# Patient Record
Sex: Female | Born: 1950 | Race: Black or African American | Hispanic: No | Marital: Single | Smoking: Never smoker
Health system: Southern US, Community
[De-identification: ages and names within clinical notes are randomized; demographics above are authoritative.]

## PROBLEM LIST (undated history)

## (undated) DIAGNOSIS — D509 Iron deficiency anemia, unspecified: Secondary | ICD-10-CM

## (undated) DIAGNOSIS — Z87442 Personal history of urinary calculi: Secondary | ICD-10-CM

## (undated) DIAGNOSIS — K279 Peptic ulcer, site unspecified, unspecified as acute or chronic, without hemorrhage or perforation: Secondary | ICD-10-CM

## (undated) DIAGNOSIS — R05 Cough: Secondary | ICD-10-CM

## (undated) DIAGNOSIS — K219 Gastro-esophageal reflux disease without esophagitis: Secondary | ICD-10-CM

## (undated) DIAGNOSIS — I10 Essential (primary) hypertension: Secondary | ICD-10-CM

## (undated) DIAGNOSIS — R011 Cardiac murmur, unspecified: Secondary | ICD-10-CM

## (undated) DIAGNOSIS — Z9889 Other specified postprocedural states: Secondary | ICD-10-CM

## (undated) DIAGNOSIS — Z951 Presence of aortocoronary bypass graft: Secondary | ICD-10-CM

## (undated) DIAGNOSIS — I2510 Atherosclerotic heart disease of native coronary artery without angina pectoris: Secondary | ICD-10-CM

## (undated) DIAGNOSIS — J309 Allergic rhinitis, unspecified: Secondary | ICD-10-CM

## (undated) DIAGNOSIS — F411 Generalized anxiety disorder: Secondary | ICD-10-CM

## (undated) DIAGNOSIS — F329 Major depressive disorder, single episode, unspecified: Secondary | ICD-10-CM

## (undated) DIAGNOSIS — M1990 Unspecified osteoarthritis, unspecified site: Secondary | ICD-10-CM

## (undated) DIAGNOSIS — I340 Nonrheumatic mitral (valve) insufficiency: Secondary | ICD-10-CM

## (undated) HISTORY — DX: Peptic ulcer, site unspecified, unspecified as acute or chronic, without hemorrhage or perforation: K27.9

## (undated) HISTORY — PX: ABDOMINAL HYSTERECTOMY: SHX81

## (undated) HISTORY — DX: Atherosclerotic heart disease of native coronary artery without angina pectoris: I25.10

## (undated) HISTORY — PX: CARDIAC CATHETERIZATION: SHX172

## (undated) HISTORY — PX: OTHER SURGICAL HISTORY: SHX169

## (undated) HISTORY — DX: Generalized anxiety disorder: F41.1

## (undated) HISTORY — DX: Iron deficiency anemia, unspecified: D50.9

## (undated) HISTORY — DX: Cardiac murmur, unspecified: R01.1

## (undated) HISTORY — PX: CHOLECYSTECTOMY: SHX55

## (undated) HISTORY — DX: Essential (primary) hypertension: I10

## (undated) HISTORY — DX: Gastro-esophageal reflux disease without esophagitis: K21.9

## (undated) HISTORY — DX: Major depressive disorder, single episode, unspecified: F32.9

## (undated) HISTORY — DX: Cough: R05

## (undated) HISTORY — DX: Allergic rhinitis, unspecified: J30.9

## (undated) NOTE — *Deleted (*Deleted)
[facility]  [phone] [phone]     ID: Tami Wood DOB: June 23, 1950  MR#: [id number]  JYN#:[id number]  Patient Care Team: Corwin Levins, MD as PCP - General (Internal Medicine) Pershing Proud, RN as Oncology Nurse Navigator Donnelly Angelica, RN as Oncology Nurse Navigator Harriette Bouillon, MD as Consulting Physician (General Surgery) Trysten Bernard, Valentino Hue, MD as Consulting Physician (Oncology) Antony Blackbird, MD as Consulting Physician (Radiation Oncology) Elder Negus, MD as Consulting Physician (Cardiology) Purcell Nails, MD as Consulting Physician (Cardiothoracic Surgery) Drema Halon, MD as Consulting Physician (Otolaryngology) Lowella Dell, MD OTHER MD:  CHIEF COMPLAINT: Ductal carcinoma in situ, estrogen receptor positive  CURRENT TREATMENT: observation   INTERVAL HISTORY: Idalie returns today for follow up of her noninvasive breast cancer.  She opted against antiestrogens and is now under observation.  She completed radiation therapy under Dr. Roselind Messier on 09/25/2019.  Since her last visit, she underwent bilateral diagnostic mammography with tomography at [facility] on 01/03/2020 showing: breast density category C; no evidence of malignancy in either breast.    REVIEW OF SYSTEMS: Samra    COVID 19 VACCINATION STATUS:    HISTORY OF CURRENT ILLNESS: From the original intake note:  Tami Wood had routine screening mammography on 12/18/2018 showing 0.7 cm probably-benign calcifications in the lower-outer right breast, 6 month follow up was recommended. She underwent right diagnostic mammography with tomography at [facility] on 06/18/2019 showing: breast density category C; 0.7 cm grouped calcifications in the lower-outer right breast.  Accordingly on 07/01/2019 she proceeded to biopsy of the right breast area in question. The pathology from this procedure ([id number]) showed: ductal carcinoma in situ with central necrosis  and calcifications, intermediate grade. Prognostic indicators significant for: estrogen receptor, 90% positive and progesterone receptor, 5% positive, both with strong staining intensity.   The patient's subsequent history is as detailed below.   PAST MEDICAL HISTORY: Past Medical History:  Diagnosis Date  . ALLERGIC RHINITIS 06/18/2009  . ANEMIA-IRON DEFICIENCY 11/28/2006  . ANXIETY 11/28/2006  . Arthritis   . Coronary artery disease   . Cough 06/18/2009  . DEPRESSION 11/28/2006  . GERD 12/10/2007  . History of kidney stones   . HYPERTENSION 11/28/2006  . Hypertension   . Mitral regurgitation   . MURMUR 11/28/2006  . PEPTIC ULCER DISEASE 12/10/2007  . 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 S/P mitral valve repair 04/10/2018   Complex valvuloplasty including artificial Gore-tex neochord placement x8 and Sorin Memo 4D ring annuloplasty, size 28    PAST SURGICAL HISTORY: Past Surgical History:  Procedure Laterality Date  . ABDOMINAL HYSTERECTOMY    . BREAST LUMPECTOMY WITH RADIOACTIVE SEED LOCALIZATION Right 08/07/2019   Procedure: RIGHT BREAST LUMPECTOMY WITH RADIOACTIVE SEED LOCALIZATION;  Surgeon: Harriette Bouillon, MD;  Location:  SURGERY CENTER;  Service: General;  Laterality: Right;  . CARDIAC CATHETERIZATION    . CHOLECYSTECTOMY    . CORONARY ARTERY BYPASS GRAFT N/A 04/10/2018   Procedure: CORONARY ARTERY BYPASS GRAFTING (CABG), ON PUMP, TIMES TWO, LIMA TO LAD AND SVG TO RCA USING LEFT INTERNAL MAMMARY ARTERY AND RIGHT GREATER SAPHENOUS VEIN HARVESTED ENDOSCOPICALLY;  Surgeon: Purcell Nails, MD;  Location: [facility] OR;  Service: Open Heart Surgery;  Laterality: N/A;  . MITRAL VALVE REPAIR N/A 04/10/2018   Procedure: MITRAL VALVE REPAIR (MVR) USING 28 MEMO 4D;  Surgeon: Purcell Nails, MD;  Location: [facility] OR;  Service: Open Heart  Surgery;  Laterality: N/A;  . RIGHT/LEFT HEART CATH AND CORONARY ANGIOGRAPHY N/A 02/06/2018   Procedure: RIGHT/LEFT HEART  CATH AND CORONARY ANGIOGRAPHY;  Surgeon: Elder Negus, MD;  Location: [facility] INVASIVE CV LAB;  Service: Cardiovascular;  Laterality: N/A;  . s/p multiple breast biopsy     negative  . TEE WITHOUT CARDIOVERSION N/A 02/06/2018   Procedure: TRANSESOPHAGEAL ECHOCARDIOGRAM (TEE);  Surgeon: Elder Negus, MD;  Location: [facility] ENDOSCOPY;  Service: Cardiovascular;  Laterality: N/A;  . TEE WITHOUT CARDIOVERSION N/A 04/10/2018   Procedure: TRANSESOPHAGEAL ECHOCARDIOGRAM (TEE);  Surgeon: Purcell Nails, MD;  Location: [facility] OR;  Service: Open Heart Surgery;  Laterality: N/A;    FAMILY HISTORY: Family History  Problem Relation Age of Onset  . Transient ischemic attack Other   . Hypertension Other   . Colon cancer Other   . Heart disease Father   . Heart failure Father   . Breast cancer Sister 75  . Colon cancer Brother   . Cancer Maternal Uncle   . Cancer Paternal Uncle   . Breast cancer Cousin   Her father is age 16 (as of 08-08-19). Her mother died at age 44 from pulmonary fibrosis. Marisela has 1 brother and 3 sisters. She reports breast cancer in a sister at age 44 and cancer of unknown type in a cousin. She also reports two uncles with cancer (unknown type), colon cancer in her brother, and a great uncle with leukemia.   GYNECOLOGIC HISTORY:  No LMP recorded. Patient has had a hysterectomy. Menarche: age unsure GX P 0 Contraceptive never used HRT never used  Hysterectomy? yes BSO? no   SOCIAL HISTORY: (updated 08/08/19)  Chanell retired from working for [employer].. She is single. She lives at home by herself, with no pets. She attends a [employer].    ADVANCED DIRECTIVES: Not in place. She intends to name her sister, [name], as her HCPOA. She can be reached at [phone] (cell).   HEALTH MAINTENANCE: Social History   Tobacco Use  . Smoking status: Never Smoker  . Smokeless tobacco: Never Used  Vaping Use  . Vaping Use: Never used  Substance Use Topics   . Alcohol use: No  . Drug use: Never     Colonoscopy: date unknown  PAP: date unknown  Bone density: date unknown   No Known Allergies  Current Outpatient Medications  Medication Sig Dispense Refill  . aspirin 81 MG EC tablet Take 81 mg by mouth daily.      Marland Kitchen atorvastatin (LIPITOR) 80 MG tablet TAKE 1 TABLET BY MOUTH DAILY AT 6 PM 90 tablet 3  . cetirizine (ZYRTEC) 10 MG tablet Take 1 tablet (10 mg total) by mouth daily. 30 tablet 11  . ENTRESTO 49-51 MG Take 1 tablet by mouth twice daily 60 tablet 6  . furosemide (LASIX) 20 MG tablet Take 1 tablet (20 mg total) by mouth daily as needed. 30 tablet 2  . metoprolol succinate (TOPROL XL) 50 MG 24 hr tablet Take 1 tablet (50 mg total) by mouth daily. 90 tablet 3  . sodium chloride (OCEAN) 0.65 % SOLN nasal spray Place 1 spray into both nostrils as needed for congestion.     No current facility-administered medications for this visit.    OBJECTIVE: African-American woman who appears stated age  There were no vitals filed for this visit.   There is no height or weight on file to calculate BMI.   Wt Readings from Last 3 Encounters:  10/24/19 176  lb 14.4 oz (80.2 kg)  09/19/19 179 lb 8 oz (81.4 kg)  09/16/19 178 lb (80.7 kg)      ECOG FS:1 - Symptomatic but completely ambulatory  Sclerae unicteric, EOMs intact Wearing a mask No cervical or supraclavicular adenopathy Lungs no rales or rhonchi Heart regular rate and rhythm Abd soft, nontender, positive bowel sounds MSK no focal spinal tenderness, no upper extremity lymphedema Neuro: nonfocal, well oriented, appropriate affect Breasts:    {Sclerae unicteric, EOMs intact Wearing a mask No cervical or supraclavicular adenopathy Lungs no rales or rhonchi Heart regular rate and rhythm Abd soft, nontender, positive bowel sounds MSK no focal spinal tenderness, no upper extremity lymphedema Neuro: nonfocal, well oriented, appropriate affect Breasts: The right breast is status post  lumpectomy and is currently receiving radiation.  There is some hyperpigmentation.  The cosmetic result is excellent.  There is no evidence of residual or recurrent disease.  Both axillae are benign.}  LAB RESULTS:  CMP     Component Value Date/Time   NA 140 07/10/2019 0836   NA 141 06/19/2019 1402   K 4.1 07/10/2019 0836   CL 106 07/10/2019 0836   CO2 30 07/10/2019 0836   GLUCOSE 98 07/10/2019 0836   BUN 9 07/10/2019 0836   BUN 9 06/19/2019 1402   CREATININE 0.77 07/10/2019 0836   CALCIUM 8.8 (L) 07/10/2019 0836   PROT 8.5 (H) 07/10/2019 0836   ALBUMIN 3.4 (L) 07/10/2019 0836   AST 39 07/10/2019 0836   ALT 44 07/10/2019 0836   ALKPHOS 144 (H) 07/10/2019 0836   BILITOT 0.7 07/10/2019 0836   GFRNONAA >60 07/10/2019 0836   GFRAA >60 07/10/2019 0836    No results found for: TOTALPROTELP, ALBUMINELP, A1GS, A2GS, BETS, BETA2SER, GAMS, MSPIKE, SPEI  Lab Results  Component Value Date   WBC 3.6 (L) 07/10/2019   NEUTROABS 1.7 07/10/2019   HGB 11.9 (L) 07/10/2019   HCT 36.9 07/10/2019   MCV 93.9 07/10/2019   PLT 156 07/10/2019    No results found for: LABCA2  No components found for: GNFAOZ308  No results for input(s): INR in the last 168 hours.  No results found for: LABCA2  No results found for: MVH846  No results found for: NGE952  No results found for: WUX324  No results found for: CA2729  No components found for: HGQUANT  No results found for: CEA1 / No results found for: CEA1   No results found for: AFPTUMOR  No results found for: CHROMOGRNA  No results found for: KPAFRELGTCHN, LAMBDASER, KAPLAMBRATIO (kappa/lambda light chains)  No results found for: HGBA, HGBA2QUANT, HGBFQUANT, HGBSQUAN (Hemoglobinopathy evaluation)   No results found for: LDH  No results found for: IRON, TIBC, IRONPCTSAT (Iron and TIBC)  No results found for: FERRITIN  Urinalysis    Component Value Date/Time   COLORURINE STRAW (A) 04/06/2018 0922   APPEARANCEUR CLEAR  04/06/2018 0922   LABSPEC 1.003 (L) 04/06/2018 0922   PHURINE 7.0 04/06/2018 0922   GLUCOSEU NEGATIVE 04/06/2018 0922   GLUCOSEU NEGATIVE 08/16/2017 1710   HGBUR NEGATIVE 04/06/2018 0922   BILIRUBINUR NEGATIVE 04/06/2018 0922   KETONESUR NEGATIVE 04/06/2018 0922   PROTEINUR NEGATIVE 04/06/2018 0922   UROBILINOGEN 0.2 08/16/2017 1710   NITRITE NEGATIVE 04/06/2018 0922   LEUKOCYTESUR NEGATIVE 04/06/2018 0922    STUDIES: No results found.   ELIGIBLE FOR AVAILABLE RESEARCH PROTOCOL: given area of necrosis not a good COMET candidate  ASSESSMENT: 42 y.o. [place] woman status post right breast biopsy 07/01/2019 for ductal carcinoma  in situ, grade 2, estrogen and progesterone receptor positive.  (1) status post right lumpectomy with no sentinel lymph node sampling 08/07/2019, showing no residual cancer in the breast.  (2) adjuvant radiation to be completed 09/25/2019  (3) opted against antiestrogens   (a) status post hysterectomy   PLAN: They will seen has done very well with her local treatment which she will complete 09/25/2019.  She now can consider antiestrogens We discussed the difference between tamoxifen and anastrozole in detail. She understands that anastrozole and the aromatase inhibitors in general work by blocking estrogen production. Accordingly vaginal dryness, decrease in bone density, and of course hot flashes can result. The aromatase inhibitors can also negatively affect the cholesterol profile, although that is a minor effect. One out of 5 women on aromatase inhibitors we will feel "old and achy". This arthralgia/myalgia syndrome, which resembles fibromyalgia clinically, does resolve with stopping the medications. Accordingly this is not a reason to not try an aromatase inhibitor but it is a frequent reason to stop it (in other words 20% of women will not be able to tolerate these medications).  Tamoxifen on the other hand does not block estrogen production. It does  not "take away a woman's estrogen". It blocks the estrogen receptor in breast cells. Like anastrozole, it can also cause hot flashes. As opposed to anastrozole, tamoxifen has many estrogen-like effects. It is technically an estrogen receptor modulator. This means that in some tissues tamoxifen works like estrogen-- for example it helps strengthen the bones. It tends to improve the cholesterol profile. It can cause thickening of the endometrial lining, and even endometrial polyps or rarely cancer of the uterus.(The risk of uterine cancer due to tamoxifen is one additional cancer per thousand women year). It can cause vaginal wetness or stickiness. It can cause blood clots through this estrogen-like effect--the risk of blood clots with tamoxifen is exactly the same as with birth control pills or hormone replacement.  Neither of these agents causes mood changes or weight gain, despite the popular belief that they can have these side effects. We have data from studies comparing either of these drugs with placebo, and in those cases the control group had the same amount of weight gain and depression as the group that took the drug.  After this discussion what they will see him would like to do is simply observation.  I am not comfortable with that decision since she had a noninvasive breast cancer that she likely has been cured and the main purpose of antiestrogens was prevention.  She will have mammography in October and see me early November.  From that point I will see her on a once a year basis  She knows to call for any other issue that may develop before the next visit.  Total encounter time 25 minutes.Raymond Gurney C. Calven Gilkes, MD 01/27/2020 8:46 AM Medical Oncology and Hematology [facility] [address] [place], [place] [address] Tel. [phone]    Fax. [phone]   This document serves as a record of services personally performed by Ruthann Cancer, MD. It was created on his  behalf by Mickie Bail, a trained medical scribe. The creation of this record is based on the scribe's personal observations and the provider's statements to them.   I, Ruthann Cancer MD, have reviewed the above documentation for accuracy and completeness, and I agree with the above.   *Total Encounter Time as defined by the Centers for Medicare and Medicaid Services includes, in  addition to the face-to-face time of a patient visit (documented in the note above) non-face-to-face time: obtaining and reviewing outside history, ordering and reviewing medications, tests or procedures, care coordination (communications with other health care professionals or caregivers) and documentation in the medical record.

---

## 1998-08-06 ENCOUNTER — Other Ambulatory Visit: Admission: RE | Admit: 1998-08-06 | Discharge: 1998-08-06 | Payer: Self-pay | Admitting: Obstetrics and Gynecology

## 1999-12-09 ENCOUNTER — Other Ambulatory Visit: Admission: RE | Admit: 1999-12-09 | Discharge: 1999-12-09 | Payer: Self-pay | Admitting: Obstetrics and Gynecology

## 2000-04-28 ENCOUNTER — Other Ambulatory Visit: Admission: RE | Admit: 2000-04-28 | Discharge: 2000-04-28 | Payer: Self-pay | Admitting: Obstetrics and Gynecology

## 2000-08-22 ENCOUNTER — Encounter (INDEPENDENT_AMBULATORY_CARE_PROVIDER_SITE_OTHER): Payer: Self-pay | Admitting: Specialist

## 2000-08-22 ENCOUNTER — Ambulatory Visit (HOSPITAL_BASED_OUTPATIENT_CLINIC_OR_DEPARTMENT_OTHER): Admission: RE | Admit: 2000-08-22 | Discharge: 2000-08-22 | Payer: Self-pay | Admitting: *Deleted

## 2001-01-26 ENCOUNTER — Other Ambulatory Visit: Admission: RE | Admit: 2001-01-26 | Discharge: 2001-01-26 | Payer: Self-pay | Admitting: Obstetrics and Gynecology

## 2004-03-28 LAB — HM MAMMOGRAPHY

## 2004-10-28 ENCOUNTER — Ambulatory Visit: Payer: Self-pay | Admitting: Gastroenterology

## 2004-11-15 ENCOUNTER — Ambulatory Visit: Payer: Self-pay | Admitting: Internal Medicine

## 2004-11-17 ENCOUNTER — Ambulatory Visit: Payer: Self-pay | Admitting: Gastroenterology

## 2004-11-17 LAB — HM COLONOSCOPY

## 2005-02-09 ENCOUNTER — Other Ambulatory Visit: Admission: RE | Admit: 2005-02-09 | Discharge: 2005-02-09 | Payer: Self-pay | Admitting: Obstetrics and Gynecology

## 2005-04-21 ENCOUNTER — Ambulatory Visit: Payer: Self-pay | Admitting: Gastroenterology

## 2005-05-06 ENCOUNTER — Ambulatory Visit: Payer: Self-pay | Admitting: Gastroenterology

## 2005-05-13 ENCOUNTER — Ambulatory Visit: Payer: Self-pay | Admitting: Gastroenterology

## 2005-12-21 ENCOUNTER — Ambulatory Visit: Payer: Self-pay | Admitting: Internal Medicine

## 2006-11-24 ENCOUNTER — Other Ambulatory Visit: Admission: RE | Admit: 2006-11-24 | Discharge: 2006-11-24 | Payer: Self-pay | Admitting: Obstetrics and Gynecology

## 2006-11-28 ENCOUNTER — Ambulatory Visit: Payer: Self-pay | Admitting: Internal Medicine

## 2006-11-28 ENCOUNTER — Encounter: Payer: Self-pay | Admitting: Internal Medicine

## 2006-11-28 DIAGNOSIS — I10 Essential (primary) hypertension: Secondary | ICD-10-CM | POA: Insufficient documentation

## 2006-11-28 DIAGNOSIS — R011 Cardiac murmur, unspecified: Secondary | ICD-10-CM | POA: Insufficient documentation

## 2006-11-28 DIAGNOSIS — F411 Generalized anxiety disorder: Secondary | ICD-10-CM

## 2006-11-28 DIAGNOSIS — D509 Iron deficiency anemia, unspecified: Secondary | ICD-10-CM

## 2006-11-28 DIAGNOSIS — F329 Major depressive disorder, single episode, unspecified: Secondary | ICD-10-CM

## 2006-11-28 DIAGNOSIS — F3289 Other specified depressive episodes: Secondary | ICD-10-CM

## 2006-11-28 HISTORY — DX: Iron deficiency anemia, unspecified: D50.9

## 2006-11-28 HISTORY — DX: Essential (primary) hypertension: I10

## 2006-11-28 HISTORY — DX: Major depressive disorder, single episode, unspecified: F32.9

## 2006-11-28 HISTORY — DX: Cardiac murmur, unspecified: R01.1

## 2006-11-28 HISTORY — DX: Generalized anxiety disorder: F41.1

## 2006-11-28 HISTORY — DX: Other specified depressive episodes: F32.89

## 2007-11-27 ENCOUNTER — Ambulatory Visit: Payer: Self-pay | Admitting: Obstetrics and Gynecology

## 2007-11-27 ENCOUNTER — Other Ambulatory Visit: Admission: RE | Admit: 2007-11-27 | Discharge: 2007-11-27 | Payer: Self-pay | Admitting: Obstetrics and Gynecology

## 2007-11-27 ENCOUNTER — Encounter: Payer: Self-pay | Admitting: Obstetrics and Gynecology

## 2007-12-10 ENCOUNTER — Ambulatory Visit: Payer: Self-pay | Admitting: Internal Medicine

## 2007-12-10 DIAGNOSIS — K279 Peptic ulcer, site unspecified, unspecified as acute or chronic, without hemorrhage or perforation: Secondary | ICD-10-CM | POA: Insufficient documentation

## 2007-12-10 DIAGNOSIS — K219 Gastro-esophageal reflux disease without esophagitis: Secondary | ICD-10-CM

## 2007-12-10 HISTORY — DX: Peptic ulcer, site unspecified, unspecified as acute or chronic, without hemorrhage or perforation: K27.9

## 2007-12-10 HISTORY — DX: Gastro-esophageal reflux disease without esophagitis: K21.9

## 2008-04-01 ENCOUNTER — Telehealth: Payer: Self-pay | Admitting: Internal Medicine

## 2009-06-18 ENCOUNTER — Ambulatory Visit: Payer: Self-pay | Admitting: Internal Medicine

## 2009-06-18 DIAGNOSIS — J309 Allergic rhinitis, unspecified: Secondary | ICD-10-CM

## 2009-06-18 DIAGNOSIS — R059 Cough, unspecified: Secondary | ICD-10-CM

## 2009-06-18 DIAGNOSIS — R05 Cough: Secondary | ICD-10-CM

## 2009-06-18 HISTORY — DX: Cough, unspecified: R05.9

## 2009-06-18 HISTORY — DX: Allergic rhinitis, unspecified: J30.9

## 2009-12-30 ENCOUNTER — Ambulatory Visit: Payer: Self-pay | Admitting: Obstetrics and Gynecology

## 2009-12-30 ENCOUNTER — Other Ambulatory Visit: Admission: RE | Admit: 2009-12-30 | Discharge: 2009-12-30 | Payer: Self-pay | Admitting: Obstetrics and Gynecology

## 2010-03-04 ENCOUNTER — Ambulatory Visit: Payer: Self-pay | Admitting: Obstetrics and Gynecology

## 2010-04-29 NOTE — Assessment & Plan Note (Signed)
Summary: last seen 2009/cd   Vital Signs:  Patient profile:   60 year old female Height:      66 inches Weight:      192.50 pounds BMI:     31.18 O2 Sat:      98 % on Room air Temp:     97.8 degrees F oral Pulse rate:   67 / minute BP sitting:   134 / 80  (left arm) Cuff size:   regular  Vitals Entered ByZella Ball Ewing (June 18, 2009 11:48 AM)  O2 Flow:  Room air CC: Needs refills/RE   CC:  Needs refills/RE.  History of Present Illness: here with relatively recent increased cough for one month, nonprod without fever, ST, and Pt denies CP, sob, doe, wheezing, orthopnea, pnd, worsening LE edema, palps, dizziness or syncope    Pt denies new neuro symptoms such as headache, facial or extremity weakness    Does have mild to mod nasal allergy symtpoms but zyrtec is too sedating.  No obvious reflux or asthma wheezing.  No haorseness or hemoptysis, wt loss, fever, night sweats, or other constitutional symtpoms.   She is convinced her ACE is causing the cough, though she has been on the med for over a year without cough before more recent onset.    Problems Prior to Update: 1)  Allergic Rhinitis  (ICD-477.9) 2)  Cough  (ICD-786.2) 3)  Preventive Health Care  (ICD-V70.0) 4)  Peptic Ulcer Disease  (ICD-533.90) 5)  Gerd  (ICD-530.81) 6)  Anxiety  (ICD-300.00) 7)  Murmur  (ICD-785.2) 8)  Hypertension  (ICD-401.9) 9)  Depression  (ICD-311) 10)  Anemia-iron Deficiency  (ICD-280.9)  Medications Prior to Update: 1)  Benazepril-Hydrochlorothiazide 10-12.5 Mg Tabs (Benazepril-Hydrochlorothiazide) .Marland Kitchen.. 1 By Mouth Once Daily 2)  Omega-3 350 Mg Caps (Omega-3 Fatty Acids) 3)  Adult Aspirin Ec Low Strength 81 Mg Tbec (Aspirin) .Marland Kitchen.. 1 By Mouth Once Daily  Current Medications (verified): 1)  Losartan Potassium-Hctz 50-12.5 Mg Tabs (Losartan Potassium-Hctz) .Marland Kitchen.. 1po Once Daily 2)  Omega-3 350 Mg Caps (Omega-3 Fatty Acids) 3)  Adult Aspirin Ec Low Strength 81 Mg Tbec (Aspirin) .Marland Kitchen.. 1 By Mouth Once  Daily 4)  Prevacid 30 Mg Cpdr (Lansoprazole) .Marland Kitchen.. 1 By Mouth Once Daily 5)  Fexofenadine Hcl 180 Mg Tabs (Fexofenadine Hcl) .Marland Kitchen.. 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   Head:  normocephalic and atraumatic.   Eyes:  vision grossly intact, pupils equal, and pupils round.   Ears:  R ear normal and L ear normal.   Nose:  no external deformity and no nasal discharge.   Mouth:  no gingival abnormalities and pharynx pink and moist.   Neck:  supple and no masses.   Lungs:  normal respiratory effort and normal breath sounds.   Heart:  normal rate and regular rhythm.   Msk:  no joint tenderness and no joint  swelling.   Extremities:  no edema, no erythema  Neurologic:  cranial nerves II-XII intact and strength normal in all extremities.   Skin:  color normal and no rashes.   Psych:  not depressed appearing and slightly anxious.     Impression & Recommendations:  Problem # 1:  COUGH (ICD-786.2)  Orders: T-2 View CXR, Same Day (71020.5TC)  unclear etiology, but I suspect possible post nasal gtt/.allergies for trial allegra, check cxr , cont PPI as she takes, consider ENT eval but declines at this time  Problem # 2:  HYPERTENSION (ICD-401.9)  Her updated medication list for this problem includes:     Losartan Potassium-hctz 50-12.5 Mg Tabs (Losartan potassium-hctz) .Marland Kitchen... 1po once daily I doubt ACE cough, but will defer to pt - change med to above  BP today: 134/80 Prior BP: 126/84 (12/10/2007)  Problem # 3:  ALLERGIC RHINITIS (ICD-477.9)  Her updated medication list for this problem includes:    Fexofenadine Hcl 180 Mg Tabs (Fexofenadine hcl) .Marland Kitchen... 1po once daily as needed treat as above, f/u any worsening signs or symptoms   Complete Medication List: 1)  Losartan Potassium-hctz 50-12.5 Mg Tabs (Losartan potassium-hctz) .Marland Kitchen.. 1po once daily 2)  Omega-3 350 Mg Caps (Omega-3 fatty acids) 3)  Adult Aspirin Ec Low Strength 81 Mg Tbec (Aspirin) .Marland Kitchen.. 1 by mouth once daily 4)  Prevacid 30 Mg Cpdr (Lansoprazole) .Marland Kitchen.. 1 by mouth once daily 5)  Fexofenadine Hcl 180 Mg Tabs (Fexofenadine hcl) .Marland Kitchen.. 1po once daily as needed  Patient Instructions: 1)  Please go to Radiology in the basement level for your X-Ray today  2)  stop the benazepril hct 3)  Please take all new medications as prescribed 4)  Continue all previous medications as before this visit  5)  Please schedule a follow-up appointment in 1 month with CPX labs Prescriptions: FEXOFENADINE HCL 180 MG TABS (FEXOFENADINE HCL) 1po once daily as needed  #14 x 0   Entered by:   Scharlene Gloss   Authorized by:   Corwin Levins MD   Signed by:   Scharlene Gloss on 06/18/2009   Method used:   Electronically to        [facility] [address] [phone]* (retail)       [address]       [place], [place]  [address]       Ph: [phone] or [phone]       Fax: [phone]   RxID:   [id number] LOSARTAN POTASSIUM-HCTZ 50-12.5 MG TABS (LOSARTAN POTASSIUM-HCTZ) 1po once daily  #14 x 0   Entered by:   Scharlene Gloss   Authorized by:   Corwin Levins MD   Signed by:   Scharlene Gloss on 06/18/2009   Method used:   Electronically to        [facility] [address] [phone]* (retail)       [address]       [place], [place]  [address]       Ph: [phone] or [phone]       Fax: [phone]   RxID:   [id number] LOSARTAN POTASSIUM-HCTZ 50-12.5 MG TABS (LOSARTAN POTASSIUM-HCTZ) 1po once daily  #90 x 3   Entered and Authorized by:   Corwin Levins MD   Signed by:   Corwin Levins MD on 06/18/2009   Method used:   Print then  Give to Patient   RxID:   [id number] FEXOFENADINE HCL 180 MG TABS (FEXOFENADINE HCL) 1po once daily as needed  #90 x 3   Entered and Authorized by:   Corwin Levins MD   Signed by:   Corwin Levins MD on 06/18/2009   Method used:   Print then Give to Patient   RxID:   [id number]

## 2010-06-16 ENCOUNTER — Ambulatory Visit (INDEPENDENT_AMBULATORY_CARE_PROVIDER_SITE_OTHER): Payer: 59 | Admitting: Obstetrics and Gynecology

## 2010-06-16 DIAGNOSIS — N6019 Diffuse cystic mastopathy of unspecified breast: Secondary | ICD-10-CM

## 2010-06-16 DIAGNOSIS — N644 Mastodynia: Secondary | ICD-10-CM

## 2010-06-24 ENCOUNTER — Other Ambulatory Visit: Payer: Self-pay | Admitting: Radiology

## 2010-08-13 NOTE — Op Note (Signed)
[facility]  Patient:    Tami Wood, Tami Wood                        MRN: [id number] Proc. Date: 08/22/00 Adm. Date:  [id number] Attending:  Kandis Mannan CC:         Rande Brunt. Eda Paschal, M.D.   Operative Report  PREOPERATIVE DIAGNOSIS:  Nipple discharge, right breast.  POSTOPERATIVE DIAGNOSIS:  Nipple discharge, right breast.  OPERATION PERFORMED:  Excision of ductal tissue, right breast.  SURGEON:  Maisie Fus B. Samuella Cota, M.D.  ANESTHESIA:  Local 1% Xylocaine without epinephrine, anesthesia monitoring, anesthesiologist and CRNA Haynes Dage.  DESCRIPTION OF PROCEDURE:  The patient was taken to the operating room and placed on the table in supine position.  The right breast was prepped and draped in a sterile field.  The patient had had a clear right nipple discharge and by ductogram there was seen to be a slight abnormality in the duct at the 7 oclock position.  A marking pen was used to outline a circumareolar incision extending from about 5 oclock to 9 oclock.  The local anesthetic was then injected and the circumareolar incision made.  The tissue behind the nipple areolar complex was dissected free.  There appeared to be a dilated duct at the central portion of the nipple.  The patients discharge has been clear and there was no evidence of any bleeding.  The breast tissue immediately retroareolar was excised.  Bleeding was controlled with the cautery.  The specimen was marked with a suture showing the anterior retroareolar surface.  No abnormality was really noted on the tissue except what appeared to be the dilated duct just beneath the nipple.  The wound was then closed with interrupted sutures of 3-0 Vicryl followed by a running subcuticular suture of 4-0 Vicryl.  Benzoin and half-inch Steri-Strips were used to reinforce the skin closure.  A compressive dressing using 4 x 4s, ABD, and 4-inch Hypafix was applied.  The patient seemed to tolerate  the procedure well and was taken to the PACU in satisfactory condition. DD:  08/22/00 TD:  08/22/00 Job: [id number]

## 2010-09-22 ENCOUNTER — Encounter: Payer: Self-pay | Admitting: Internal Medicine

## 2010-09-22 ENCOUNTER — Ambulatory Visit (INDEPENDENT_AMBULATORY_CARE_PROVIDER_SITE_OTHER): Payer: 59 | Admitting: Internal Medicine

## 2010-09-22 VITALS — BP 120/82 | HR 79 | Temp 97.9°F | Ht 65.0 in | Wt 198.2 lb

## 2010-09-22 DIAGNOSIS — Z0000 Encounter for general adult medical examination without abnormal findings: Secondary | ICD-10-CM | POA: Insufficient documentation

## 2010-09-22 MED ORDER — LOSARTAN POTASSIUM-HCTZ 50-12.5 MG PO TABS: 1.0000 | ORAL_TABLET | Freq: Every day | ORAL | Status: DC

## 2010-09-22 NOTE — Assessment & Plan Note (Signed)

## 2010-09-22 NOTE — Patient Instructions (Signed)
Continue all other medications as before Please go to LAB in the Basement for the blood and/or urine tests to be done today Please call the phone number [phone] (the PhoneTree System) for results of testing in 2-3 days;  When calling, simply dial the number, and when prompted enter the MRN number above (the Medical Record Number) and the # key, then the message should start. Please return in 1 year for your yearly visit, or sooner if needed, with Lab testing done 3-5 days before  

## 2010-09-22 NOTE — Progress Notes (Signed)
Subjective:    Patient ID: Tami Wood, female    DOB: [birth year], 60 y.o.   MRN: [id number]  HPI Here for wellness and f/u;  Overall doing ok;  Pt denies CP, worsening SOB, DOE, wheezing, orthopnea, PND, worsening LE edema, palpitations, dizziness or syncope.  Pt denies neurological change such as new Headache, facial or extremity weakness.  Pt denies polydipsia, polyuria, or low sugar symptoms. Pt states overall good compliance with treatment and medications, good tolerability, and trying to follow lower cholesterol diet.  Pt denies worsening depressive symptoms, suicidal ideation or panic. No fever, wt loss, night sweats, loss of appetite, or other constitutional symptoms.  Pt states good ability with ADL's, low fall risk, home safety reviewed and adequate, no significant changes in hearing or vision, and occasionally active with exercise.  No other new complaints Past Medical History  Diagnosis Date  . ALLERGIC RHINITIS 06/18/2009  . ANEMIA-IRON DEFICIENCY 11/28/2006  . ANXIETY 11/28/2006  . Cough 06/18/2009  . DEPRESSION 11/28/2006  . GERD 12/10/2007  . HYPERTENSION 11/28/2006  . MURMUR 11/28/2006  . PEPTIC ULCER DISEASE 12/10/2007   Past Surgical History  Procedure Date  . Abdominal hysterectomy   . Cholecystectomy   . S/p multiple breast biopsy     negative    reports that she has never smoked. She does not have any smokeless tobacco history on file. She reports that she does not drink alcohol. Her drug history not on file. family history includes Hypertension in her other and Transient ischemic attack in her other. No Known Allergies Current Outpatient Prescriptions on File Prior to Visit  Medication Sig Dispense Refill  . aspirin 81 MG EC tablet Take 81 mg by mouth daily.        Marland Kitchen DISCONTD: losartan-hydrochlorothiazide (HYZAAR) 50-12.5 MG per tablet Take 1 tablet by mouth daily.        Marland Kitchen DISCONTD: fexofenadine (ALLEGRA) 180 MG tablet Take 180 mg by mouth daily.        Marland Kitchen DISCONTD: Omega-3  350 MG CAPS Take by mouth daily.         Review of Systems Review of Systems  Constitutional: Negative for diaphoresis, activity change, appetite change and unexpected weight change.  HENT: Negative for hearing loss, ear pain, facial swelling, mouth sores and neck stiffness.   Eyes: Negative for pain, redness and visual disturbance.  Respiratory: Negative for shortness of breath and wheezing.   Cardiovascular: Negative for chest pain and palpitations.  Gastrointestinal: Negative for diarrhea, blood in stool, abdominal distention and rectal pain.  Genitourinary: Negative for hematuria, flank pain and decreased urine volume.  Musculoskeletal: Negative for myalgias and joint swelling.  Skin: Negative for color change and wound.  Neurological: Negative for syncope and numbness.  Hematological: Negative for adenopathy.  Psychiatric/Behavioral: Negative for hallucinations, self-injury, decreased concentration and agitation.      Objective:   Physical Exam BP 120/82  Pulse 79  Temp(Src) 97.9 F (36.6 C) (Oral)  Ht 5\' 5"  (1.651 m)  Wt 198 lb 4 oz (89.926 kg)  BMI 32.99 kg/m2  SpO2 97% Physical Exam  VS noted, obese Constitutional: Pt is oriented to person, place, and time. Appears well-developed and well-nourished.  HENT:  Head: Normocephalic and atraumatic.  Right Ear: External ear normal.  Left Ear: External ear normal.  Nose: Nose normal.  Mouth/Throat: Oropharynx is clear and moist.  Eyes: Conjunctivae and EOM are normal. Pupils are equal, round, and reactive to light.  Neck: Normal range of motion. Neck supple. No  JVD present. No tracheal deviation present.  Cardiovascular: Normal rate, regular rhythm, normal heart sounds and intact distal pulses.   Pulmonary/Chest: Effort normal and breath sounds normal.  Abdominal: Soft. Bowel sounds are normal. There is no tenderness.  Musculoskeletal: Normal range of motion. Exhibits no edema.  Lymphadenopathy:  Has no cervical adenopathy.    Neurological: Pt is alert and oriented to person, place, and time. Pt has normal reflexes. No cranial nerve deficit.  Skin: Skin is warm and dry. No rash noted.  Psychiatric:  Has  normal mood and affect. Behavior is normal.         Assessment & Plan:

## 2010-11-10 ENCOUNTER — Other Ambulatory Visit: Payer: Self-pay

## 2010-11-10 DIAGNOSIS — D249 Benign neoplasm of unspecified breast: Secondary | ICD-10-CM

## 2010-11-11 ENCOUNTER — Telehealth: Payer: Self-pay

## 2010-11-11 NOTE — Telephone Encounter (Signed)
Opened in error

## 2010-12-24 ENCOUNTER — Encounter: Payer: Self-pay | Admitting: Obstetrics and Gynecology

## 2011-02-24 ENCOUNTER — Telehealth: Payer: Self-pay

## 2011-02-24 ENCOUNTER — Other Ambulatory Visit (INDEPENDENT_AMBULATORY_CARE_PROVIDER_SITE_OTHER): Payer: 59

## 2011-02-24 ENCOUNTER — Ambulatory Visit (INDEPENDENT_AMBULATORY_CARE_PROVIDER_SITE_OTHER): Payer: 59 | Admitting: Internal Medicine

## 2011-02-24 ENCOUNTER — Encounter: Payer: Self-pay | Admitting: Internal Medicine

## 2011-02-24 VITALS — BP 132/80 | HR 73 | Temp 97.5°F | Ht 65.5 in | Wt 199.4 lb

## 2011-02-24 DIAGNOSIS — H6693 Otitis media, unspecified, bilateral: Secondary | ICD-10-CM | POA: Insufficient documentation

## 2011-02-24 DIAGNOSIS — H6690 Otitis media, unspecified, unspecified ear: Secondary | ICD-10-CM

## 2011-02-24 DIAGNOSIS — Z0000 Encounter for general adult medical examination without abnormal findings: Secondary | ICD-10-CM

## 2011-02-24 LAB — BASIC METABOLIC PANEL
BUN: 12 mg/dL (ref 6–23)
CO2: 30 mEq/L (ref 19–32)
Glucose, Bld: 120 mg/dL — ABNORMAL HIGH (ref 70–99)
Potassium: 3.4 mEq/L — ABNORMAL LOW (ref 3.5–5.1)
Sodium: 139 mEq/L (ref 135–145)

## 2011-02-24 LAB — CBC WITH DIFFERENTIAL/PLATELET
Basophils Absolute: 0 10*3/uL (ref 0.0–0.1)
Eosinophils Relative: 1.5 % (ref 0.0–5.0)
Lymphocytes Relative: 49.9 % — ABNORMAL HIGH (ref 12.0–46.0)
Monocytes Relative: 8.8 % (ref 3.0–12.0)
Neutrophils Relative %: 39.2 % — ABNORMAL LOW (ref 43.0–77.0)
Platelets: 179 10*3/uL (ref 150.0–400.0)
RDW: 13.5 % (ref 11.5–14.6)
WBC: 4.5 10*3/uL (ref 4.5–10.5)

## 2011-02-24 LAB — HEPATIC FUNCTION PANEL
AST: 28 U/L (ref 0–37)
Albumin: 3.7 g/dL (ref 3.5–5.2)
Alkaline Phosphatase: 122 U/L — ABNORMAL HIGH (ref 39–117)
Total Protein: 8.7 g/dL — ABNORMAL HIGH (ref 6.0–8.3)

## 2011-02-24 LAB — URINALYSIS, ROUTINE W REFLEX MICROSCOPIC
Ketones, ur: NEGATIVE
Specific Gravity, Urine: 1.025 (ref 1.000–1.030)
Urobilinogen, UA: 0.2 (ref 0.0–1.0)

## 2011-02-24 LAB — LIPID PANEL
Cholesterol: 156 mg/dL (ref 0–200)
HDL: 44.9 mg/dL (ref >39.00)
VLDL: 24.6 mg/dL (ref 0.0–40.0)

## 2011-02-24 LAB — TSH: TSH: 0.48 u[IU]/mL (ref 0.35–5.50)

## 2011-02-24 MED ORDER — LEVOFLOXACIN 250 MG PO TABS: 250.0000 mg | ORAL_TABLET | Freq: Every day | ORAL | Status: AC

## 2011-02-24 NOTE — Telephone Encounter (Signed)
Patient called with ear pain and dizziness times 2 weeks. The patient requested antibiotic, informed office policy would need OV to evaluatate, patient agreed and scheduled OV.

## 2011-02-24 NOTE — Patient Instructions (Signed)
Take all new medications as prescribed You can also take Delsym OTC for cough, and/or Mucinex (or it's generic off brand) for congestion Please go to LAB in the Basement for the blood and/or urine tests to be done today Please call the phone number [phone] (the [employer] System) for results of testing in 2-3 days;  When calling, simply dial the number, and when prompted enter the MRN number above (the Medical Record Number) and the # key, then the message should start. Please return in 1 year for your yearly visit, or sooner if needed, with Lab testing done 3-5 days before

## 2011-02-27 ENCOUNTER — Encounter: Payer: Self-pay | Admitting: Internal Medicine

## 2011-02-27 NOTE — Progress Notes (Signed)
Subjective:    Patient ID: Tami Wood, female    DOB: Dec 26, 1950, 60 y.o.   MRN: [id number]  HPI  Here for wellness and f/u;  Overall doing ok;  Pt denies CP, worsening SOB, DOE, wheezing, orthopnea, PND, worsening LE edema, palpitations, dizziness or syncope.  Pt denies neurological change such as new Headache, facial or extremity weakness.  Pt denies polydipsia, polyuria, or low sugar symptoms. Pt states overall good compliance with treatment and medications, good tolerability, and trying to follow lower cholesterol diet.  Pt denies worsening depressive symptoms, suicidal ideation or panic. No fever, wt loss, night sweats, loss of appetite, or other constitutional symptoms.  Pt states good ability with ADL's, low fall risk, home safety reviewed and adequate, no significant changes in hearing or vision, and occasionally active with exercise.  Does incidentally have bilat ear pain, pressure, low grade temp x 2 wks, without hearing loss, vertigo, n/v. Past Medical History  Diagnosis Date  . ALLERGIC RHINITIS 06/18/2009  . ANEMIA-IRON DEFICIENCY 11/28/2006  . ANXIETY 11/28/2006  . Cough 06/18/2009  . DEPRESSION 11/28/2006  . GERD 12/10/2007  . HYPERTENSION 11/28/2006  . MURMUR 11/28/2006  . PEPTIC ULCER DISEASE 12/10/2007   Past Surgical History  Procedure Date  . Abdominal hysterectomy   . Cholecystectomy   . S/p multiple breast biopsy     negative    reports that she has never smoked. She does not have any smokeless tobacco history on file. She reports that she does not drink alcohol. Her drug history not on file. family history includes Hypertension in her other and Transient ischemic attack in her other. No Known Allergies Current Outpatient Prescriptions on File Prior to Visit  Medication Sig Dispense Refill  . aspirin 81 MG EC tablet Take 81 mg by mouth daily.        Marland Kitchen losartan-hydrochlorothiazide (HYZAAR) 50-12.5 MG per tablet Take 1 tablet by mouth daily.  90 tablet  3   Review of  Systems Review of Systems  Constitutional: Negative for diaphoresis, activity change, appetite change and unexpected weight change.  HENT: Negative for hearing loss, ear pain, facial swelling, mouth sores and neck stiffness.   Eyes: Negative for pain, redness and visual disturbance.  Respiratory: Negative for shortness of breath and wheezing.   Cardiovascular: Negative for chest pain and palpitations.  Gastrointestinal: Negative for diarrhea, blood in stool, abdominal distention and rectal pain.  Genitourinary: Negative for hematuria, flank pain and decreased urine volume.  Musculoskeletal: Negative for myalgias and joint swelling.  Skin: Negative for color change and wound.  Neurological: Negative for syncope and numbness.  Hematological: Negative for adenopathy.  Psychiatric/Behavioral: Negative for hallucinations, self-injury, decreased concentration and agitation.      Objective:   Physical Exam BP 132/80  Pulse 73  Temp(Src) 97.5 F (36.4 C) (Oral)  Ht 5' 5.5" (1.664 m)  Wt 199 lb 6 oz (90.436 kg)  BMI 32.67 kg/m2  SpO2 97% Physical Exam  VS noted, mild ill  Constitutional: Pt is oriented to person, place, and time. Appears well-developed and well-nourished.  HENT:  Head: Normocephalic and atraumatic.  Right Ear: External ear normal.  Left Ear: External ear normal.  Nose: Nose normal.  Bilat tm's mild to mod erythema.  Sinus nontender.  Pharynx mild erythema Mouth/Throat: Oropharynx is clear and moist.  Eyes: Conjunctivae and EOM are normal. Pupils are equal, round, and reactive to light.  Neck: Normal range of motion. Neck supple. No JVD present. No tracheal deviation present.  Cardiovascular: Normal  rate, regular rhythm, normal heart sounds and intact distal pulses.   Pulmonary/Chest: Effort normal and breath sounds normal.  Abdominal: Soft. Bowel sounds are normal. There is no tenderness.  Musculoskeletal: Normal range of motion. Exhibits no edema.  Lymphadenopathy:   Has no cervical adenopathy.  Neurological: Pt is alert and oriented to person, place, and time. Pt has normal reflexes. No cranial nerve deficit.  Skin: Skin is warm and dry. No rash noted.  Psychiatric:  Has  normal mood and affect. Behavior is normal.     Assessment & Plan:

## 2011-02-27 NOTE — Assessment & Plan Note (Signed)
Mild to mod, for antibx course,  to f/u any worsening symptoms or concerns 

## 2011-02-27 NOTE — Assessment & Plan Note (Signed)

## 2011-06-07 ENCOUNTER — Ambulatory Visit (INDEPENDENT_AMBULATORY_CARE_PROVIDER_SITE_OTHER): Payer: 59 | Admitting: Internal Medicine

## 2011-06-07 ENCOUNTER — Encounter: Payer: Self-pay | Admitting: Internal Medicine

## 2011-06-07 VITALS — BP 120/80 | HR 67 | Temp 98.1°F | Ht 65.5 in | Wt 196.1 lb

## 2011-06-07 DIAGNOSIS — R21 Rash and other nonspecific skin eruption: Secondary | ICD-10-CM | POA: Insufficient documentation

## 2011-06-07 DIAGNOSIS — I10 Essential (primary) hypertension: Secondary | ICD-10-CM

## 2011-06-07 MED ORDER — TRIAMCINOLONE ACETONIDE 0.1 % EX CREA: TOPICAL_CREAM | Freq: Two times a day (BID) | CUTANEOUS | Status: AC

## 2011-06-07 MED ORDER — TRIAMCINOLONE ACETONIDE 0.1 % EX CREA: TOPICAL_CREAM | Freq: Two times a day (BID) | CUTANEOUS | Status: DC

## 2011-06-07 NOTE — Patient Instructions (Signed)
Take all new medications as prescribed Continue all other medications as before Please continue to use the moisturizer as discussed

## 2011-06-07 NOTE — Assessment & Plan Note (Signed)
For skin moisturizer and /or trian cr prn, consider derm consult,  to f/u any worsening symptoms or concerns

## 2011-06-07 NOTE — Assessment & Plan Note (Signed)
stable overall by hx and exam, most recent data reviewed with pt, and pt to continue medical treatment as before  BP Readings from Last 3 Encounters:  06/07/11 120/80  02/24/11 132/80  09/22/10 120/82

## 2011-06-07 NOTE — Progress Notes (Signed)
  Subjective:    Patient ID: Tami Wood, female    DOB: 1950-08-02, 61 y.o.   MRN: [id number]  HPI  Here to f/u; overall doing ok,  Pt denies chest pain, increased sob or doe, wheezing, orthopnea, PND, increased LE swelling, palpitations, dizziness or syncope.  Pt denies new neurological symptoms such as new headache, or facial or extremity weakness or numbness   Pt denies polydipsia, polyuria, Pt states overall good compliance with meds, trying to follow lower cholesterol diet, wt overall stable.  Relates how husband for some reason keeps the home very warm even up to 80 degrees , dries her skin out especially her face, which is itchy, she scratches and rubs and now seems to be somewhat discolored.   Past Medical History  Diagnosis Date  . ALLERGIC RHINITIS 06/18/2009  . ANEMIA-IRON DEFICIENCY 11/28/2006  . ANXIETY 11/28/2006  . Cough 06/18/2009  . DEPRESSION 11/28/2006  . GERD 12/10/2007  . HYPERTENSION 11/28/2006  . MURMUR 11/28/2006  . PEPTIC ULCER DISEASE 12/10/2007   Past Surgical History  Procedure Date  . Abdominal hysterectomy   . Cholecystectomy   . S/p multiple breast biopsy     negative    reports that she has never smoked. She does not have any smokeless tobacco history on file. She reports that she does not drink alcohol. Her drug history not on file. family history includes Hypertension in her other and Transient ischemic attack in her other. No Known Allergies Current Outpatient Prescriptions on File Prior to Visit  Medication Sig Dispense Refill  . losartan-hydrochlorothiazide (HYZAAR) 50-12.5 MG per tablet Take 1 tablet by mouth daily.  90 tablet  3  . aspirin 81 MG EC tablet Take 81 mg by mouth daily.        . Omega-3 Fatty Acids (FISH OIL PO) Take by mouth daily.         Review of Systems All otherwise neg per pt     Objective:   Physical Exam BP 120/80  Pulse 67  Temp(Src) 98.1 F (36.7 C) (Oral)  Ht 5' 5.5" (1.664 m)  Wt 196 lb 2 oz (88.962 kg)  BMI 32.14 kg/m2   SpO2 97% Physical Exam  VS noted Constitutional: Pt appears well-developed and well-nourished.  HENT: Head: Normocephalic.  Right Ear: External ear normal.  Left Ear: External ear normal.  Eyes: Conjunctivae and EOM are normal. Pupils are equal, round, and reactive to light.  Neck: Normal range of motion. Neck supple.  Cardiovascular: Normal rate and regular rhythm.   Pulmonary/Chest: Effort normal and breath sounds normal.  Skin: Skin is warm. No erythema. mild dry skin, and slight nondiscrete discoloration bilat facies Psychiatric: Pt behavior is normal. Thought content normal.     Assessment & Plan:

## 2011-09-15 ENCOUNTER — Other Ambulatory Visit: Payer: Self-pay | Admitting: Internal Medicine

## 2011-12-27 ENCOUNTER — Encounter: Payer: Self-pay | Admitting: Obstetrics and Gynecology

## 2012-05-19 ENCOUNTER — Other Ambulatory Visit: Payer: Self-pay | Admitting: Internal Medicine

## 2012-12-05 ENCOUNTER — Encounter: Payer: Self-pay | Admitting: Women's Health

## 2012-12-06 ENCOUNTER — Other Ambulatory Visit: Payer: Self-pay | Admitting: *Deleted

## 2012-12-06 DIAGNOSIS — R922 Inconclusive mammogram: Secondary | ICD-10-CM

## 2012-12-07 ENCOUNTER — Encounter: Payer: Self-pay | Admitting: Women's Health

## 2013-01-18 ENCOUNTER — Encounter: Payer: Self-pay | Admitting: Internal Medicine

## 2013-01-18 ENCOUNTER — Ambulatory Visit (INDEPENDENT_AMBULATORY_CARE_PROVIDER_SITE_OTHER): Payer: 59 | Admitting: Internal Medicine

## 2013-01-18 VITALS — BP 122/78 | HR 77 | Temp 97.7°F | Ht 65.5 in | Wt 178.5 lb

## 2013-01-18 DIAGNOSIS — Z0000 Encounter for general adult medical examination without abnormal findings: Secondary | ICD-10-CM

## 2013-01-18 DIAGNOSIS — I10 Essential (primary) hypertension: Secondary | ICD-10-CM

## 2013-01-18 MED ORDER — LOSARTAN POTASSIUM-HCTZ 50-12.5 MG PO TABS: ORAL_TABLET | ORAL | Status: DC

## 2013-01-18 NOTE — Patient Instructions (Signed)
Please continue all other medications as before, and refills have been done if requested. Please have the pharmacy call with any other refills you may need.  Please go to the LAB in the Basement (turn left off the elevator) for the tests to be done at your convenience  You will be contacted by phone if any changes need to be made immediately.  Otherwise, you will receive a letter about your results with an explanation, but please check with MyChart first.  Please remember to sign up for My Chart if you have not done so, as this will be important to you in the future with finding out test results, communicating by private email, and scheduling acute appointments online when needed.  Please return in 1 year for your yearly visit, or sooner if needed

## 2013-01-18 NOTE — Progress Notes (Signed)
Subjective:    Patient ID: Tami Wood, female    DOB: 10-Apr-1950, 62 y.o.   MRN: [id number]  HPI  Here for wellness and f/u;  Overall doing ok;  Pt denies CP, worsening SOB, DOE, wheezing, orthopnea, PND, worsening LE edema, palpitations, dizziness or syncope.  Pt denies neurological change such as new headache, facial or extremity weakness.  Pt denies polydipsia, polyuria, or low sugar symptoms. Pt states overall good compliance with treatment and medications, good tolerability, and has been trying to follow lower cholesterol diet.  Pt denies worsening depressive symptoms, suicidal ideation or panic. No fever, night sweats, wt loss, loss of appetite, or other constitutional symptoms.  Pt states good ability with ADL's, has low fall risk, home safety reviewed and adequate, no other significant changes in hearing or vision, and only occasionally active with exercise.  Lost 20 lbs since June 2012.  No acute complaints.   Past Medical History  Diagnosis Date  . ALLERGIC RHINITIS 06/18/2009  . ANEMIA-IRON DEFICIENCY 11/28/2006  . ANXIETY 11/28/2006  . Cough 06/18/2009  . DEPRESSION 11/28/2006  . GERD 12/10/2007  . HYPERTENSION 11/28/2006  . MURMUR 11/28/2006  . PEPTIC ULCER DISEASE 12/10/2007   Past Surgical History  Procedure Laterality Date  . Abdominal hysterectomy    . Cholecystectomy    . S/p multiple breast biopsy      negative    reports that she has never smoked. She does not have any smokeless tobacco history on file. She reports that she does not drink alcohol. Her drug history is not on file. family history includes Hypertension in her other; Transient ischemic attack in her other. No Known Allergies Current Outpatient Prescriptions on File Prior to Visit  Medication Sig Dispense Refill  . aspirin 81 MG EC tablet Take 81 mg by mouth daily.        . Omega-3 Fatty Acids (FISH OIL PO) Take by mouth daily.         No current facility-administered medications on file prior to visit.     Review of Systems Constitutional: Negative for diaphoresis, activity change, appetite change or unexpected weight change.  HENT: Negative for hearing loss, ear pain, facial swelling, mouth sores and neck stiffness.   Eyes: Negative for pain, redness and visual disturbance.  Respiratory: Negative for shortness of breath and wheezing.   Cardiovascular: Negative for chest pain and palpitations.  Gastrointestinal: Negative for diarrhea, blood in stool, abdominal distention or other pain Genitourinary: Negative for hematuria, flank pain or change in urine volume.  Musculoskeletal: Negative for myalgias and joint swelling.  Skin: Negative for color change and wound.  Neurological: Negative for syncope and numbness. other than noted Hematological: Negative for adenopathy.  Psychiatric/Behavioral: Negative for hallucinations, self-injury, decreased concentration and agitation.      Objective:   Physical Exam BP 122/78  Pulse 77  Temp(Src) 97.7 F (36.5 C) (Oral)  Ht 5' 5.5" (1.664 m)  Wt 178 lb 8 oz (80.967 kg)  BMI 29.24 kg/m2  SpO2 97% VS noted,  Constitutional: Pt is oriented to person, place, and time. Appears well-developed and well-nourished.  Head: Normocephalic and atraumatic.  Right Ear: External ear normal.  Left Ear: External ear normal.  Nose: Nose normal.  Mouth/Throat: Oropharynx is clear and moist.  Eyes: Conjunctivae and EOM are normal. Pupils are equal, round, and reactive to light.  Neck: Normal range of motion. Neck supple. No JVD present. No tracheal deviation present.  Cardiovascular: Normal rate, regular rhythm, normal heart sounds and intact  distal pulses.   Pulmonary/Chest: Effort normal and breath sounds normal.  Abdominal: Soft. Bowel sounds are normal. There is no tenderness. No HSM  Musculoskeletal: Normal range of motion. Exhibits no edema.  Lymphadenopathy:  Has no cervical adenopathy.  Neurological: Pt is alert and oriented to person, place, and  time. Pt has normal reflexes. No cranial nerve deficit.  Skin: Skin is warm and dry. No rash noted.  Psychiatric:  Has  normal mood and affect. Behavior is normal.     Assessment & Plan:

## 2013-01-20 NOTE — Assessment & Plan Note (Signed)

## 2013-01-20 NOTE — Assessment & Plan Note (Signed)
stable overall by history and exam, recent data reviewed with pt, and pt to continue medical treatment as before,  to f/u any worsening symptoms or concerns BP Readings from Last 3 Encounters:  01/18/13 122/78  06/07/11 120/80  02/24/11 132/80

## 2013-05-30 ENCOUNTER — Other Ambulatory Visit: Payer: Self-pay | Admitting: *Deleted

## 2013-05-30 DIAGNOSIS — R921 Mammographic calcification found on diagnostic imaging of breast: Secondary | ICD-10-CM

## 2013-06-07 ENCOUNTER — Encounter: Payer: Self-pay | Admitting: Women's Health

## 2013-12-05 ENCOUNTER — Other Ambulatory Visit: Payer: Self-pay | Admitting: *Deleted

## 2013-12-05 DIAGNOSIS — R922 Inconclusive mammogram: Secondary | ICD-10-CM

## 2013-12-27 ENCOUNTER — Other Ambulatory Visit: Payer: Self-pay | Admitting: Internal Medicine

## 2014-03-04 ENCOUNTER — Other Ambulatory Visit: Payer: Self-pay | Admitting: Internal Medicine

## 2014-05-05 ENCOUNTER — Telehealth: Payer: Self-pay | Admitting: Internal Medicine

## 2014-05-05 NOTE — Telephone Encounter (Signed)
Pt came by office today and is having issues getting her insurance straight through [facility].  Pt is in need of a refill on her BP medication.  She will take her last pill tomorrow and would like to pick up a paper script on Tues so she can get it filled ASAP while she deals with her insurance issues.  Pt wants a generic med if possible.  She is no longer using [facility].

## 2014-05-06 MED ORDER — LOSARTAN POTASSIUM-HCTZ 50-12.5 MG PO TABS: 1.0000 | ORAL_TABLET | Freq: Every day | ORAL | Status: DC

## 2014-05-06 NOTE — Telephone Encounter (Signed)
rx up front for p/u, pt aware

## 2014-05-06 NOTE — Telephone Encounter (Signed)
Jobos for hardcopy current generic BP med -Done hardcopy to cindy

## 2014-05-30 ENCOUNTER — Encounter (HOSPITAL_COMMUNITY): Payer: Self-pay | Admitting: Emergency Medicine

## 2014-05-30 ENCOUNTER — Emergency Department (INDEPENDENT_AMBULATORY_CARE_PROVIDER_SITE_OTHER)
Admission: EM | Admit: 2014-05-30 | Discharge: 2014-05-30 | Disposition: A | Discharge status: Home / Self Care | Payer: Medicare Other | Source: Home / Self Care | Attending: Family Medicine | Admitting: Family Medicine

## 2014-05-30 DIAGNOSIS — B9789 Other viral agents as the cause of diseases classified elsewhere: Principal | ICD-10-CM

## 2014-05-30 DIAGNOSIS — K219 Gastro-esophageal reflux disease without esophagitis: Secondary | ICD-10-CM | POA: Diagnosis not present

## 2014-05-30 DIAGNOSIS — J069 Acute upper respiratory infection, unspecified: Secondary | ICD-10-CM | POA: Diagnosis not present

## 2014-05-30 DIAGNOSIS — I10 Essential (primary) hypertension: Secondary | ICD-10-CM

## 2014-05-30 DIAGNOSIS — IMO0001 Reserved for inherently not codable concepts without codable children: Secondary | ICD-10-CM

## 2014-05-30 MED ORDER — FLUTICASONE PROPIONATE 50 MCG/ACT NA SUSP: 2.0000 | Freq: Every day | NASAL | Status: DC

## 2014-05-30 MED ORDER — IPRATROPIUM BROMIDE 0.06 % NA SOLN: 2.0000 | Freq: Four times a day (QID) | NASAL | Status: DC

## 2014-05-30 MED ORDER — SUCRALFATE 1 G PO TABS: 1.0000 g | ORAL_TABLET | Freq: Three times a day (TID) | ORAL | Status: DC

## 2014-05-30 NOTE — Discharge Instructions (Signed)
You are doing well overall. Your symptoms are likely due to a viral upper respiratory tract infection. This should resolve in another 2-4 days. Please start using the nasal Atrovent and nasal Flonase along with some Tylenol. Please also consider using a daily allergy pill or some Mucinex D. Please start using the sucralfate for your stomach pains this will help to coat and protect the stomach from ulcers. Please come back if your symptoms are not better in a week or get significantly worse during this period of time. Please follow-up with your primary care doctor regarding your murmur. It might be time for you to have an echocardiogram to further evaluate your overall heart health and the murmur.

## 2014-05-30 NOTE — ED Notes (Signed)
C/o cold sx onset yest Sx include: ST, chills, runny nose, congestion Denies fevers, SOB, wheezing Taking OTC cold meds w/no relief Alert, no signs of acute distress.

## 2014-05-30 NOTE — ED Provider Notes (Signed)
CSN: [id number]     Arrival date & time 05/30/14  1452 History   First MD Initiated Contact with Patient 05/30/14 1554     No chief complaint on file.  (Consider location/radiation/quality/duration/timing/severity/associated sxs/prior Treatment) HPI  1 day ago, developed a tightness in her neck. Tylenol adn alka seltzer w/ improvement. Neck tightness has resolved but now "feel like something is in her upper chest/throat." Associated w/ runny nose, subjective fevers, chills. Denies nausea, vomiting, diarrhea, rash, CP, SOB, palpitations. Pt endorses only sleeping 4 hrs per night - which is her baseline. Symptoms come and go.    Past Medical History  Diagnosis Date  . ALLERGIC RHINITIS 06/18/2009  . ANEMIA-IRON DEFICIENCY 11/28/2006  . ANXIETY 11/28/2006  . Cough 06/18/2009  . DEPRESSION 11/28/2006  . GERD 12/10/2007  . HYPERTENSION 11/28/2006  . MURMUR 11/28/2006  . PEPTIC ULCER DISEASE 12/10/2007   Past Surgical History  Procedure Laterality Date  . Abdominal hysterectomy    . Cholecystectomy    . S/p multiple breast biopsy      negative   Family History  Problem Relation Age of Onset  . Transient ischemic attack Other   . Hypertension Other    History  Substance Use Topics  . Smoking status: Never Smoker   . Smokeless tobacco: Not on file  . Alcohol Use: No   OB History    No data available     Review of Systems Per HPI with all other pertinent systems negative.   Allergies  Review of patient's allergies indicates no known allergies.  Home Medications   Prior to Admission medications   Medication Sig Start Date End Date Taking? Authorizing Provider  aspirin 81 MG EC tablet Take 81 mg by mouth daily.      Historical Provider, MD  fluticasone (FLONASE) 50 MCG/ACT nasal spray Place 2 sprays into both nostrils at bedtime. 05/30/14   Waldemar Dickens, MD  ipratropium (ATROVENT) 0.06 % nasal spray Place 2 sprays into both nostrils 4 (four) times daily. 05/30/14   Waldemar Dickens, MD   losartan-hydrochlorothiazide (HYZAAR) 50-12.5 MG per tablet Take 1 tablet by mouth daily. 05/06/14   Biagio Borg, MD  Omega-3 Fatty Acids (FISH OIL PO) Take by mouth daily.      Historical Provider, MD  sucralfate (CARAFATE) 1 G tablet Take 1 tablet (1 g total) by mouth 3 (three) times daily with meals. 05/30/14   Waldemar Dickens, MD   BP 151/90 mmHg  Pulse 79  Temp(Src) 99.3 F (37.4 C) (Oral)  Resp 18  SpO2 96% Physical Exam  Constitutional: She is oriented to person, place, and time. She appears well-developed and well-nourished.  HENT:  Head: Normocephalic.  Clear rhinorrhea. TMs normal bilaterally. No lymphadenopathy. Thyroid normal.  Eyes: Pupils are equal, round, and reactive to light.  Neck: Normal range of motion. Neck supple.  Cardiovascular: Normal rate, normal heart sounds and intact distal pulses.   4/6 holosystolic murmur  Pulmonary/Chest: Effort normal and breath sounds normal. No respiratory distress. She has no wheezes. She has no rales. She exhibits no tenderness.  Abdominal: Soft. Bowel sounds are normal. She exhibits no distension.  Musculoskeletal: Normal range of motion. She exhibits no edema or tenderness.  Neurological: She is alert and oriented to person, place, and time.  Skin: Skin is warm.  Psychiatric: She has a normal mood and affect. Judgment and thought content normal.    ED Course  Procedures (including critical care time) Labs Review Labs Reviewed -  No data to display  Imaging Review No results found.   MDM   1. Viral URI with cough   2. Reflux   3. Essential hypertension    Nasal Atrovent, Flonase, Zyrtec, Mucinex D. Start Sucralfate and continue over-the-counter H2 blocker Elevation hypertension likely due to current illness and Claudette Head being in the doctor's office.  Precautions given and all questions answered  Linna Darner, MD Family Medicine 05/30/2014, 4:52 PM    Waldemar Dickens, MD 05/30/14 [phone]

## 2014-11-06 ENCOUNTER — Encounter: Payer: Self-pay | Admitting: Internal Medicine

## 2014-11-06 ENCOUNTER — Ambulatory Visit (INDEPENDENT_AMBULATORY_CARE_PROVIDER_SITE_OTHER): Payer: Medicare Other | Admitting: Internal Medicine

## 2014-11-06 VITALS — BP 126/82 | HR 59 | Temp 98.1°F | Ht 65.0 in | Wt 179.0 lb

## 2014-11-06 DIAGNOSIS — I10 Essential (primary) hypertension: Secondary | ICD-10-CM

## 2014-11-06 DIAGNOSIS — R739 Hyperglycemia, unspecified: Secondary | ICD-10-CM | POA: Diagnosis not present

## 2014-11-06 DIAGNOSIS — F329 Major depressive disorder, single episode, unspecified: Secondary | ICD-10-CM

## 2014-11-06 DIAGNOSIS — Z0189 Encounter for other specified special examinations: Secondary | ICD-10-CM | POA: Diagnosis not present

## 2014-11-06 DIAGNOSIS — Z0000 Encounter for general adult medical examination without abnormal findings: Secondary | ICD-10-CM

## 2014-11-06 DIAGNOSIS — F32A Depression, unspecified: Secondary | ICD-10-CM

## 2014-11-06 MED ORDER — LOSARTAN POTASSIUM-HCTZ 50-12.5 MG PO TABS: 1.0000 | ORAL_TABLET | Freq: Every day | ORAL | Status: DC

## 2014-11-06 NOTE — Progress Notes (Signed)
Pre visit review using our clinic review tool, if applicable. No additional management support is needed unless otherwise documented below in the visit note. 

## 2014-11-06 NOTE — Assessment & Plan Note (Signed)
Asympt, for wt control, diet and check a1c. bmp

## 2014-11-06 NOTE — Assessment & Plan Note (Signed)
stable overall by history and exam, recent data reviewed with pt, and pt to continue medical treatment as before,  to f/u any worsening symptoms or concerns BP Readings from Last 3 Encounters:  11/06/14 126/82  05/30/14 151/90  01/18/13 122/78

## 2014-11-06 NOTE — Progress Notes (Signed)
Subjective:    Patient ID: Tami Wood, female    DOB: 1950-12-09, 64 y.o.   MRN: [id number]  HPI  Here for yearly f/u;  Overall doing ok;  Pt denies Chest pain, worsening SOB, DOE, wheezing, orthopnea, PND, worsening LE edema, palpitations, dizziness or syncope.  Pt denies neurological change such as new headache, facial or extremity weakness.  Pt denies polydipsia, polyuria, or low sugar symptoms. Pt states overall good compliance with treatment and medications, good tolerability, and has been trying to follow appropriate diet.  Pt denies worsening depressive symptoms, suicidal ideation or panic. No fever, night sweats, wt loss, loss of appetite, or other constitutional symptoms.  Pt states good ability with ADL's, has low fall risk, home safety reviewed and adequate, no other significant changes in hearing or vision, and only occasionally active with exercise. No current complaints. Past Medical History  Diagnosis Date  . ALLERGIC RHINITIS 06/18/2009  . ANEMIA-IRON DEFICIENCY 11/28/2006  . ANXIETY 11/28/2006  . Cough 06/18/2009  . DEPRESSION 11/28/2006  . GERD 12/10/2007  . HYPERTENSION 11/28/2006  . MURMUR 11/28/2006  . PEPTIC ULCER DISEASE 12/10/2007   Past Surgical History  Procedure Laterality Date  . Abdominal hysterectomy    . Cholecystectomy    . S/p multiple breast biopsy      negative    reports that she has never smoked. She does not have any smokeless tobacco history on file. She reports that she does not drink alcohol. Her drug history is not on file. family history includes Hypertension in her other; Transient ischemic attack in her other. No Known Allergies Current Outpatient Prescriptions on File Prior to Visit  Medication Sig Dispense Refill  . aspirin 81 MG EC tablet Take 81 mg by mouth daily.      . fluticasone (FLONASE) 50 MCG/ACT nasal spray Place 2 sprays into both nostrils at bedtime. (Patient not taking: Reported on 11/06/2014) 16 g 0  . ipratropium (ATROVENT) 0.06 %  nasal spray Place 2 sprays into both nostrils 4 (four) times daily. (Patient not taking: Reported on 11/06/2014) 15 mL 12  . Omega-3 Fatty Acids (FISH OIL PO) Take by mouth daily.      . sucralfate (CARAFATE) 1 G tablet Take 1 tablet (1 g total) by mouth 3 (three) times daily with meals. (Patient not taking: Reported on 11/06/2014) 45 tablet 0   No current facility-administered medications on file prior to visit.   Review of Systems Constitutional: Negative for increased diaphoresis, other activity, appetite or siginficant weight change other than noted HENT: Negative for worsening hearing loss, ear pain, facial swelling, mouth sores and neck stiffness.   Eyes: Negative for other worsening pain, redness or visual disturbance.  Respiratory: Negative for shortness of breath and wheezing  Cardiovascular: Negative for chest pain and palpitations.  Gastrointestinal: Negative for diarrhea, blood in stool, abdominal distention or other pain Genitourinary: Negative for hematuria, flank pain or change in urine volume.  Musculoskeletal: Negative for myalgias or other joint complaints.  Skin: Negative for color change and wound or drainage.  Neurological: Negative for syncope and numbness. other than noted Hematological: Negative for adenopathy. or other swelling Psychiatric/Behavioral: Negative for hallucinations, SI, self-injury, decreased concentration or other worsening agitation.      Objective:   Physical Exam BP 126/82 mmHg  Pulse 59  Temp(Src) 98.1 F (36.7 C) (Oral)  Ht 5\' 5"  (1.651 m)  Wt 179 lb (81.194 kg)  BMI 29.79 kg/m2  SpO2 98% VS noted,  Constitutional: Pt is oriented  to person, place, and time. Appears well-developed and well-nourished, in no significant distress Head: Normocephalic and atraumatic.  Right Ear: External ear normal.  Left Ear: External ear normal.  Nose: Nose normal.  Mouth/Throat: Oropharynx is clear and moist.  Eyes: Conjunctivae and EOM are normal. Pupils  are equal, round, and reactive to light.  Neck: Normal range of motion. Neck supple. No JVD present. No tracheal deviation present or significant neck LA or mass Cardiovascular: Normal rate, regular rhythm, normal heart sounds and intact distal pulses.   Pulmonary/Chest: Effort normal and breath sounds without rales or wheezing  Abdominal: Soft. Bowel sounds are normal. NT. No HSM  Musculoskeletal: Normal range of motion. Exhibits no edema.  Lymphadenopathy:  Has no cervical adenopathy.  Neurological: Pt is alert and oriented to person, place, and time. Pt has normal reflexes. No cranial nerve deficit. Motor grossly intact Skin: Skin is warm and dry. No rash noted.  Psychiatric:  Has normal mood and affect. Behavior is normal. not depressed affect \    Assessment & Plan:

## 2014-11-06 NOTE — Assessment & Plan Note (Signed)
stable overall by history and exam, recent data reviewed with pt, and pt to continue medical treatment as before,  to f/u any worsening symptoms or concerns Lab Results  Component Value Date   WBC 4.5 02/24/2011   HGB 12.0 02/24/2011   HCT 34.8* 02/24/2011   PLT 179.0 02/24/2011   GLUCOSE 120* 02/24/2011   CHOL 156 02/24/2011   TRIG 123.0 02/24/2011   HDL 44.90 02/24/2011   LDLCALC 87 02/24/2011   ALT 20 02/24/2011   AST 28 02/24/2011   NA 139 02/24/2011   K 3.4* 02/24/2011   CL 104 02/24/2011   CREATININE 0.8 02/24/2011   BUN 12 02/24/2011   CO2 30 02/24/2011   TSH 0.48 02/24/2011

## 2014-11-06 NOTE — Patient Instructions (Addendum)
Please continue all other medications as before, and refills have been done if requested.  Please have the pharmacy call with any other refills you may need.  Please continue your efforts at being more active, low cholesterol diet, and weight control.  You are otherwise up to date with prevention measures today.  Please keep your appointments with your specialists as you may have planned  You will be contacted regarding the referral for: colonoscopy  Please go to the LAB in the Basement (turn left off the elevator) for the tests to be done tomorrow  You will be contacted by phone if any changes need to be made immediately.  Otherwise, you will receive a letter about your results with an explanation, but please check with MyChart first.  Please remember to sign up for MyChart if you have not done so, as this will be important to you in the future with finding out test results, communicating by private email, and scheduling acute appointments online when needed.  Please return in 1 year for your yearly visit, or sooner if needed

## 2014-12-11 ENCOUNTER — Encounter: Payer: Self-pay | Admitting: Internal Medicine

## 2014-12-11 ENCOUNTER — Other Ambulatory Visit (INDEPENDENT_AMBULATORY_CARE_PROVIDER_SITE_OTHER): Payer: Medicare Other

## 2014-12-11 DIAGNOSIS — R739 Hyperglycemia, unspecified: Secondary | ICD-10-CM | POA: Diagnosis not present

## 2014-12-11 DIAGNOSIS — Z1231 Encounter for screening mammogram for malignant neoplasm of breast: Secondary | ICD-10-CM | POA: Diagnosis not present

## 2014-12-11 DIAGNOSIS — I10 Essential (primary) hypertension: Secondary | ICD-10-CM | POA: Diagnosis not present

## 2014-12-11 LAB — URINALYSIS, ROUTINE W REFLEX MICROSCOPIC
BILIRUBIN URINE: NEGATIVE
HGB URINE DIPSTICK: NEGATIVE
Ketones, ur: NEGATIVE
NITRITE: NEGATIVE
Specific Gravity, Urine: 1.01 (ref 1.000–1.030)
TOTAL PROTEIN, URINE-UPE24: NEGATIVE
URINE GLUCOSE: NEGATIVE
UROBILINOGEN UA: 0.2 (ref 0.0–1.0)
pH: 7 (ref 5.0–8.0)

## 2014-12-11 LAB — CBC WITH DIFFERENTIAL/PLATELET
BASOS ABS: 0 10*3/uL (ref 0.0–0.1)
Basophils Relative: 0.6 % (ref 0.0–3.0)
EOS PCT: 1.4 % (ref 0.0–5.0)
Eosinophils Absolute: 0.1 10*3/uL (ref 0.0–0.7)
HCT: 36.6 % (ref 36.0–46.0)
HEMOGLOBIN: 12.2 g/dL (ref 12.0–15.0)
LYMPHS ABS: 1.6 10*3/uL (ref 0.7–4.0)
Lymphocytes Relative: 45.4 % (ref 12.0–46.0)
MCHC: 33.4 g/dL (ref 30.0–36.0)
MCV: 89.1 fl (ref 78.0–100.0)
Monocytes Absolute: 0.4 10*3/uL (ref 0.1–1.0)
Monocytes Relative: 11.2 % (ref 3.0–12.0)
NEUTROS PCT: 41.4 % — AB (ref 43.0–77.0)
Neutro Abs: 1.5 10*3/uL (ref 1.4–7.7)
Platelets: 179 10*3/uL (ref 150.0–400.0)
RBC: 4.11 Mil/uL (ref 3.87–5.11)
RDW: 13.4 % (ref 11.5–15.5)
WBC: 3.6 10*3/uL — AB (ref 4.0–10.5)

## 2014-12-11 LAB — BASIC METABOLIC PANEL
BUN: 11 mg/dL (ref 6–23)
CALCIUM: 9.4 mg/dL (ref 8.4–10.5)
CO2: 31 meq/L (ref 19–32)
Chloride: 102 mEq/L (ref 96–112)
Creatinine, Ser: 0.67 mg/dL (ref 0.40–1.20)
GFR: 113.27 mL/min (ref >60.00)
GLUCOSE: 100 mg/dL — AB (ref 70–99)
Potassium: 3.9 mEq/L (ref 3.5–5.1)
Sodium: 139 mEq/L (ref 135–145)

## 2014-12-11 LAB — LIPID PANEL
CHOLESTEROL: 152 mg/dL (ref 0–200)
HDL: 51.2 mg/dL (ref >39.00)
LDL CALC: 79 mg/dL (ref 0–99)
NonHDL: 101.17
Total CHOL/HDL Ratio: 3
Triglycerides: 112 mg/dL (ref 0.0–149.0)
VLDL: 22.4 mg/dL (ref 0.0–40.0)

## 2014-12-11 LAB — HEPATIC FUNCTION PANEL
ALBUMIN: 3.7 g/dL (ref 3.5–5.2)
ALT: 13 U/L (ref 0–35)
AST: 23 U/L (ref 0–37)
Alkaline Phosphatase: 107 U/L (ref 39–117)
Bilirubin, Direct: 0.2 mg/dL (ref 0.0–0.3)
TOTAL PROTEIN: 8.7 g/dL — AB (ref 6.0–8.3)
Total Bilirubin: 0.4 mg/dL (ref 0.2–1.2)

## 2014-12-11 LAB — HEMOGLOBIN A1C: Hgb A1c MFr Bld: 5.4 % (ref 4.6–6.5)

## 2014-12-11 LAB — TSH: TSH: 0.4 u[IU]/mL (ref 0.35–4.50)

## 2015-02-16 ENCOUNTER — Telehealth: Payer: Self-pay | Admitting: Internal Medicine

## 2015-02-16 NOTE — Telephone Encounter (Signed)
Ok to make appt such as same day appt tues nov 22, to hold med until then

## 2015-02-16 NOTE — Telephone Encounter (Signed)
Patient called to advise that she is experiencing dizziness now from   losartan-hydrochlorothiazide (HYZAAR) 50-12.5 MG per tablet [id number]      . She states that this began around last month, and needs direction. Please advise patient.

## 2015-02-17 ENCOUNTER — Encounter: Payer: Self-pay | Admitting: Internal Medicine

## 2015-02-17 ENCOUNTER — Ambulatory Visit (INDEPENDENT_AMBULATORY_CARE_PROVIDER_SITE_OTHER): Payer: Medicare Other | Admitting: Internal Medicine

## 2015-02-17 VITALS — BP 110/72 | HR 61 | Temp 98.0°F | Ht 65.0 in | Wt 177.0 lb

## 2015-02-17 DIAGNOSIS — F411 Generalized anxiety disorder: Secondary | ICD-10-CM

## 2015-02-17 DIAGNOSIS — I10 Essential (primary) hypertension: Secondary | ICD-10-CM

## 2015-02-17 DIAGNOSIS — R42 Dizziness and giddiness: Secondary | ICD-10-CM | POA: Diagnosis not present

## 2015-02-17 MED ORDER — LOSARTAN POTASSIUM-HCTZ 50-12.5 MG PO TABS: ORAL_TABLET | ORAL | Status: DC

## 2015-02-17 NOTE — Telephone Encounter (Signed)
Dr. Jenny Reichmann there are no openings today.  Do you want to double book an appointment slot for today or look at a later appointment?

## 2015-02-17 NOTE — Patient Instructions (Addendum)
OK to take HALF of the losartan-hct medication  Please check your blood pressure twice per day for the next week, and call if your top number is less than 110, especially if you are still having the dizziness  Please continue all other medications as before, and refills have been done if requested.  Please have the pharmacy call with any other refills you may need.  Please keep your appointments with your specialists as you may have planned  Please return in 3 week, or sooner if needed

## 2015-02-17 NOTE — Progress Notes (Signed)
Pre visit review using our clinic review tool, if applicable. No additional management support is needed unless otherwise documented below in the visit note. 

## 2015-02-17 NOTE — Progress Notes (Signed)
Subjective:    Patient ID: Tami Wood, female    DOB: September 26, 1950, 64 y.o.   MRN: [id number]  HPI  Here to f/u; overall doing ok,  Pt denies chest pain, increasing sob or doe, wheezing, orthopnea, PND, increased LE swelling, palpitations, or syncope, but occas dizziness/lightheaded.over last few days.  Pt denies new neurological symptoms such as new headache, or facial or extremity weakness or numbness.  Pt denies polydipsia, polyuria, or low sugar episode.   Pt denies new neurological symptoms such as new headache, or facial or extremity weakness or numbness.   Pt states overall good compliance with meds, mostly trying to follow appropriate diet, with wt overall stable,  but little exercise however.  Denies worsening depressive symptoms, suicidal ideation, or panic; has ongoing anxiety, which patient thinks leads to dizziness as well?. Wt Readings from Last 3 Encounters:  02/17/15 177 lb (80.287 kg)  11/06/14 179 lb (81.194 kg)  01/18/13 178 lb 8 oz (80.967 kg)   Past Medical History  Diagnosis Date  . ALLERGIC RHINITIS 06/18/2009  . ANEMIA-IRON DEFICIENCY 11/28/2006  . ANXIETY 11/28/2006  . Cough 06/18/2009  . DEPRESSION 11/28/2006  . GERD 12/10/2007  . HYPERTENSION 11/28/2006  . MURMUR 11/28/2006  . PEPTIC ULCER DISEASE 12/10/2007   Past Surgical History  Procedure Laterality Date  . Abdominal hysterectomy    . Cholecystectomy    . S/p multiple breast biopsy      negative    reports that she has never smoked. She does not have any smokeless tobacco history on file. She reports that she does not drink alcohol. Her drug history is not on file. family history includes Hypertension in her other; Transient ischemic attack in her other. No Known Allergies Current Outpatient Prescriptions on File Prior to Visit  Medication Sig Dispense Refill  . aspirin 81 MG EC tablet Take 81 mg by mouth daily.      . Biotin 2500 MCG CAPS Take 1 tablet by mouth daily.    . fluticasone (FLONASE) 50 MCG/ACT  nasal spray Place 2 sprays into both nostrils at bedtime. (Patient not taking: Reported on 11/06/2014) 16 g 0  . ipratropium (ATROVENT) 0.06 % nasal spray Place 2 sprays into both nostrils 4 (four) times daily. (Patient not taking: Reported on 11/06/2014) 15 mL 12  . Omega-3 Fatty Acids (FISH OIL PO) Take by mouth daily.      . sucralfate (CARAFATE) 1 G tablet Take 1 tablet (1 g total) by mouth 3 (three) times daily with meals. (Patient not taking: Reported on 11/06/2014) 45 tablet 0   No current facility-administered medications on file prior to visit.   Review of Systems  Constitutional: Negative for unusual diaphoresis or night sweats HENT: Negative for ringing in ear or discharge Eyes: Negative for double vision or worsening visual disturbance.  Respiratory: Negative for choking and stridor.   Gastrointestinal: Negative for vomiting or other signifcant bowel change Genitourinary: Negative for hematuria or change in urine volume.  Musculoskeletal: Negative for other MSK pain or swelling Skin: Negative for color change and worsening wound.  Neurological: Negative for tremors and numbness other than noted  Psychiatric/Behavioral: Negative for decreased concentration or agitation other than above       Objective:   Physical Exam  BP 110/72 mmHg  Pulse 61  Temp(Src) 98 F (36.7 C) (Oral)  Ht 5\' 5"  (1.651 m)  Wt 177 lb (80.287 kg)  BMI 29.45 kg/m2  SpO2 97%  VS noted,  Constitutional: Pt appears in  no significant distress HENT: Head: NCAT.  Right Ear: External ear normal.  Left Ear: External ear normal.  Eyes: . Pupils are equal, round, and reactive to light. Conjunctivae and EOM are normal Neck: Normal range of motion. Neck supple.  Cardiovascular: Normal rate and regular rhythm.   Pulmonary/Chest: Effort normal and breath sounds without rales or wheezing.  Abd:  Soft, NT, ND, + BS Neurological: Pt is alert. Not confused , motor grossly intact Skin: Skin is warm. No rash, no LE  edema Psychiatric: Pt behavior is normal. No agitation.      Assessment & Plan:

## 2015-02-17 NOTE — Telephone Encounter (Signed)
Got patient scheduled for 6pm this afternoon.

## 2015-02-17 NOTE — Telephone Encounter (Signed)
Ok for next available, or to ER or UC if dizziness worsening, or other symtpoms such as CP, sob, falls, fever or unusual pain

## 2015-02-18 NOTE — Assessment & Plan Note (Signed)
?   Overcontrolled, for decreased losartan/HCT to Half qd, call if not able to do this will pill cutter,  to f/u any worsening symptoms or concerns next visit BP Readings from Last 3 Encounters:  02/17/15 110/72  11/06/14 126/82  05/30/14 151/90

## 2015-02-18 NOTE — Assessment & Plan Note (Signed)
Possibly related to dizziness but I think less likely than med related - ok to cont current tx

## 2015-02-18 NOTE — Assessment & Plan Note (Addendum)
Exam benign, Mild, nonspecific, suspect ? overcontrolled BP - o/w stable overall by history and exam, recent data reviewed with pt, and pt to continue medical treatment as before, declines further evaluation for now as seems mild such as other cardiac or lab testing,  To call with any further symtpoms BP Readings from Last 3 Encounters:  02/17/15 110/72  11/06/14 126/82  05/30/14 151/90   Lab Results  Component Value Date   WBC 3.6* 12/11/2014   HGB 12.2 12/11/2014   HCT 36.6 12/11/2014   PLT 179.0 12/11/2014   GLUCOSE 100* 12/11/2014   CHOL 152 12/11/2014   TRIG 112.0 12/11/2014   HDL 51.20 12/11/2014   LDLCALC 79 12/11/2014   ALT 13 12/11/2014   AST 23 12/11/2014   NA 139 12/11/2014   K 3.9 12/11/2014   CL 102 12/11/2014   CREATININE 0.67 12/11/2014   BUN 11 12/11/2014   CO2 31 12/11/2014   TSH 0.40 12/11/2014   HGBA1C 5.4 12/11/2014

## 2015-03-06 ENCOUNTER — Ambulatory Visit: Payer: Medicare Other | Admitting: Internal Medicine

## 2015-03-17 ENCOUNTER — Ambulatory Visit (INDEPENDENT_AMBULATORY_CARE_PROVIDER_SITE_OTHER): Payer: Medicare Other | Admitting: Internal Medicine

## 2015-03-17 ENCOUNTER — Encounter: Payer: Self-pay | Admitting: Internal Medicine

## 2015-03-17 VITALS — BP 118/76 | HR 71 | Temp 98.6°F | Ht 65.0 in | Wt 177.0 lb

## 2015-03-17 DIAGNOSIS — R739 Hyperglycemia, unspecified: Secondary | ICD-10-CM

## 2015-03-17 DIAGNOSIS — I10 Essential (primary) hypertension: Secondary | ICD-10-CM | POA: Diagnosis not present

## 2015-03-17 DIAGNOSIS — R011 Cardiac murmur, unspecified: Secondary | ICD-10-CM

## 2015-03-17 NOTE — Assessment & Plan Note (Signed)
stable overall by history and exam, recent data reviewed with pt, and pt to continue medical treatment as before,  to f/u any worsening symptoms or concerns Lab Results  Component Value Date   HGBA1C 5.4 12/11/2014    

## 2015-03-17 NOTE — Assessment & Plan Note (Signed)
Pt ok with furher eval today, ECG reviewed as per emr, also echo for r/o AS

## 2015-03-17 NOTE — Assessment & Plan Note (Signed)
stable overall by history and exam, recent data reviewed with pt, and pt to continue medical treatment as before,  to f/u any worsening symptoms or concerns BP Readings from Last 3 Encounters:  03/17/15 118/76  02/17/15 110/72  11/06/14 126/82

## 2015-03-17 NOTE — Addendum Note (Signed)
Addended by: Lyman Bishop on: 03/17/2015 10:02 AM   Modules accepted: Orders

## 2015-03-17 NOTE — Addendum Note (Signed)
Addended by: Lyman Bishop on: 03/17/2015 10:27 AM   Modules accepted: Orders

## 2015-03-17 NOTE — Progress Notes (Signed)
Subjective:    Patient ID: Tami Wood, female    DOB: Mar 20, 1951, 64 y.o.   MRN: [id number]  HPI Here to f/u; overall doing ok,  Pt denies chest pain, increasing sob or doe, wheezing, orthopnea, PND, increased LE swelling, palpitations, dizziness or syncope.  Pt denies new neurological symptoms such as new headache, or facial or extremity weakness or numbness.  Pt denies polydipsia, polyuria, or low sugar episode.   Pt denies new neurological symptoms such as new headache, or facial or extremity weakness or numbness.   Pt states overall good compliance with meds, mostly trying to follow appropriate diet, with wt overall stable,  but little exercise however.  Wt Readings from Last 3 Encounters:  03/17/15 177 lb (80.287 kg)  02/17/15 177 lb (80.287 kg)  11/06/14 179 lb (81.194 kg)   Past Medical History  Diagnosis Date  . ALLERGIC RHINITIS 06/18/2009  . ANEMIA-IRON DEFICIENCY 11/28/2006  . ANXIETY 11/28/2006  . Cough 06/18/2009  . DEPRESSION 11/28/2006  . GERD 12/10/2007  . HYPERTENSION 11/28/2006  . MURMUR 11/28/2006  . PEPTIC ULCER DISEASE 12/10/2007   Past Surgical History  Procedure Laterality Date  . Abdominal hysterectomy    . Cholecystectomy    . S/p multiple breast biopsy      negative    reports that she has never smoked. She does not have any smokeless tobacco history on file. She reports that she does not drink alcohol. Her drug history is not on file. family history includes Hypertension in her other; Transient ischemic attack in her other. No Known Allergies Current Outpatient Prescriptions on File Prior to Visit  Medication Sig Dispense Refill  . aspirin 81 MG EC tablet Take 81 mg by mouth daily.      . Biotin 2500 MCG CAPS Take 1 tablet by mouth daily.    Marland Kitchen losartan-hydrochlorothiazide (HYZAAR) 50-12.5 MG tablet 1/2 tab by mouth per day 90 tablet 1  . Omega-3 Fatty Acids (FISH OIL PO) Take by mouth daily.      . fluticasone (FLONASE) 50 MCG/ACT nasal spray Place 2 sprays  into both nostrils at bedtime. (Patient not taking: Reported on 11/06/2014) 16 g 0  . ipratropium (ATROVENT) 0.06 % nasal spray Place 2 sprays into both nostrils 4 (four) times daily. (Patient not taking: Reported on 11/06/2014) 15 mL 12  . sucralfate (CARAFATE) 1 G tablet Take 1 tablet (1 g total) by mouth 3 (three) times daily with meals. (Patient not taking: Reported on 11/06/2014) 45 tablet 0   No current facility-administered medications on file prior to visit.     Review of Systems  Constitutional: Negative for unusual diaphoresis or night sweats HENT: Negative for ringing in ear or discharge Eyes: Negative for double vision or worsening visual disturbance.  Respiratory: Negative for choking and stridor.   Gastrointestinal: Negative for vomiting or other signifcant bowel change Genitourinary: Negative for hematuria or change in urine volume.  Musculoskeletal: Negative for other MSK pain or swelling Skin: Negative for color change and worsening wound.  Neurological: Negative for tremors and numbness other than noted  Psychiatric/Behavioral: Negative for decreased concentration or agitation other than above       Objective:   Physical Exam BP 118/76 mmHg  Pulse 71  Temp(Src) 98.6 F (37 C) (Oral)  Ht 5\' 5"  (1.651 m)  Wt 177 lb (80.287 kg)  BMI 29.45 kg/m2  SpO2 96% VS noted,  Constitutional: Pt appears in no significant distress HENT: Head: NCAT.  Right Ear: External ear  normal.  Left Ear: External ear normal.  Eyes: . Pupils are equal, round, and reactive to light. Conjunctivae and EOM are normal Neck: Normal range of motion. Neck supple.  Cardiovascular: Normal rate and regular rhythm.  Gr 2/6 Sys murmur LUSB/RUSB Pulmonary/Chest: Effort normal and breath sounds without rales or wheezing.  Neurological: Pt is alert. Not confused , motor grossly intact Skin: Skin is warm. No rash, no LE edema Psychiatric: Pt behavior is normal. No agitation.     Assessment & Plan:

## 2015-03-17 NOTE — Patient Instructions (Addendum)
Your EKG was not able to be done today due both ECG machines not working; we will try to do this next time  Please continue all other medications as before, and refills have been done if requested.  Please have the pharmacy call with any other refills you may need.  Please continue your efforts at being more active, low cholesterol diet, and weight control.  You are otherwise up to date with prevention measures today.  Please keep your appointments with your specialists as you may have planned  You will be contacted regarding the referral for: Echocardiogram  Please return in 6 months, or sooner if needed, with Lab testing done 3-5 days before

## 2015-03-17 NOTE — Progress Notes (Signed)
Pre visit review using our clinic review tool, if applicable. No additional management support is needed unless otherwise documented below in the visit note. 

## 2015-03-27 ENCOUNTER — Telehealth: Payer: Self-pay

## 2015-03-27 NOTE — Telephone Encounter (Signed)
Left message advising patient to call back to schedule nurse visit for flu vaccine

## 2015-05-11 ENCOUNTER — Other Ambulatory Visit: Payer: Self-pay | Admitting: Internal Medicine

## 2015-12-01 DIAGNOSIS — Z803 Family history of malignant neoplasm of breast: Secondary | ICD-10-CM | POA: Diagnosis not present

## 2015-12-01 DIAGNOSIS — Z1231 Encounter for screening mammogram for malignant neoplasm of breast: Secondary | ICD-10-CM | POA: Diagnosis not present

## 2015-12-01 LAB — HM MAMMOGRAPHY

## 2015-12-02 ENCOUNTER — Encounter: Payer: Self-pay | Admitting: Internal Medicine

## 2016-03-07 ENCOUNTER — Telehealth: Payer: Self-pay | Admitting: Internal Medicine

## 2016-03-07 NOTE — Telephone Encounter (Signed)
Patient states she has "discovered a little situation".  Patient only wants to talk to Dr. Gwynn Burly assistant.  Please follow up in regard.

## 2016-03-08 NOTE — Telephone Encounter (Signed)
Spoke to patient she states that she noticed a knot on her behind, she notices it when she sits on the bed or when she sits in her chair. She advises that it is on the right cheek, and it is under the skin. Denies pain, or drainage. She is wanting to know what to do.    Patient has made an appointment

## 2016-03-09 ENCOUNTER — Encounter: Payer: Self-pay | Admitting: Internal Medicine

## 2016-03-09 ENCOUNTER — Ambulatory Visit (INDEPENDENT_AMBULATORY_CARE_PROVIDER_SITE_OTHER): Payer: Medicare Other | Admitting: Internal Medicine

## 2016-03-09 ENCOUNTER — Other Ambulatory Visit (INDEPENDENT_AMBULATORY_CARE_PROVIDER_SITE_OTHER): Payer: Medicare Other

## 2016-03-09 VITALS — BP 140/80 | HR 92 | Temp 98.2°F | Resp 20 | Wt 183.0 lb

## 2016-03-09 DIAGNOSIS — I10 Essential (primary) hypertension: Secondary | ICD-10-CM

## 2016-03-09 DIAGNOSIS — R739 Hyperglycemia, unspecified: Secondary | ICD-10-CM | POA: Diagnosis not present

## 2016-03-09 DIAGNOSIS — Z1159 Encounter for screening for other viral diseases: Secondary | ICD-10-CM | POA: Diagnosis not present

## 2016-03-09 DIAGNOSIS — R229 Localized swelling, mass and lump, unspecified: Secondary | ICD-10-CM | POA: Diagnosis not present

## 2016-03-09 DIAGNOSIS — F32A Depression, unspecified: Secondary | ICD-10-CM

## 2016-03-09 DIAGNOSIS — F329 Major depressive disorder, single episode, unspecified: Secondary | ICD-10-CM

## 2016-03-09 DIAGNOSIS — Z1211 Encounter for screening for malignant neoplasm of colon: Secondary | ICD-10-CM

## 2016-03-09 LAB — CBC WITH DIFFERENTIAL/PLATELET
BASOS ABS: 0 10*3/uL (ref 0.0–0.1)
Basophils Relative: 0.7 % (ref 0.0–3.0)
EOS ABS: 0 10*3/uL (ref 0.0–0.7)
Eosinophils Relative: 1.1 % (ref 0.0–5.0)
HCT: 36.5 % (ref 36.0–46.0)
Hemoglobin: 12.5 g/dL (ref 12.0–15.0)
LYMPHS ABS: 1.6 10*3/uL (ref 0.7–4.0)
Lymphocytes Relative: 40.3 % (ref 12.0–46.0)
MCHC: 34.2 g/dL (ref 30.0–36.0)
MCV: 88 fl (ref 78.0–100.0)
MONO ABS: 0.4 10*3/uL (ref 0.1–1.0)
Monocytes Relative: 11.2 % (ref 3.0–12.0)
NEUTROS ABS: 1.9 10*3/uL (ref 1.4–7.7)
NEUTROS PCT: 46.7 % (ref 43.0–77.0)
PLATELETS: 185 10*3/uL (ref 150.0–400.0)
RBC: 4.15 Mil/uL (ref 3.87–5.11)
RDW: 13.6 % (ref 11.5–15.5)
WBC: 4 10*3/uL (ref 4.0–10.5)

## 2016-03-09 LAB — URINALYSIS, ROUTINE W REFLEX MICROSCOPIC
Bilirubin Urine: NEGATIVE
Hgb urine dipstick: NEGATIVE
Ketones, ur: NEGATIVE
Nitrite: NEGATIVE
PH: 7 (ref 5.0–8.0)
Specific Gravity, Urine: 1.01 (ref 1.000–1.030)
TOTAL PROTEIN, URINE-UPE24: NEGATIVE
UROBILINOGEN UA: 0.2 (ref 0.0–1.0)
Urine Glucose: NEGATIVE

## 2016-03-09 LAB — BASIC METABOLIC PANEL
BUN: 11 mg/dL (ref 6–23)
CHLORIDE: 104 meq/L (ref 96–112)
CO2: 31 meq/L (ref 19–32)
Calcium: 9.4 mg/dL (ref 8.4–10.5)
Creatinine, Ser: 0.69 mg/dL (ref 0.40–1.20)
GFR: 109.08 mL/min (ref >60.00)
GLUCOSE: 105 mg/dL — AB (ref 70–99)
Potassium: 3.9 mEq/L (ref 3.5–5.1)
SODIUM: 139 meq/L (ref 135–145)

## 2016-03-09 LAB — LIPID PANEL
CHOLESTEROL: 154 mg/dL (ref 0–200)
HDL: 54.5 mg/dL (ref >39.00)
LDL Cholesterol: 76 mg/dL (ref 0–99)
NonHDL: 99.77
Total CHOL/HDL Ratio: 3
Triglycerides: 118 mg/dL (ref 0.0–149.0)
VLDL: 23.6 mg/dL (ref 0.0–40.0)

## 2016-03-09 LAB — HEPATIC FUNCTION PANEL
ALK PHOS: 98 U/L (ref 39–117)
ALT: 14 U/L (ref 0–35)
AST: 26 U/L (ref 0–37)
Albumin: 3.9 g/dL (ref 3.5–5.2)
Bilirubin, Direct: 0.1 mg/dL (ref 0.0–0.3)
TOTAL PROTEIN: 8.7 g/dL — AB (ref 6.0–8.3)
Total Bilirubin: 0.4 mg/dL (ref 0.2–1.2)

## 2016-03-09 LAB — TSH: TSH: 0.41 u[IU]/mL (ref 0.35–4.50)

## 2016-03-09 NOTE — Patient Instructions (Addendum)
Please continue all other medications as before, and refills have been done if requested.  Please have the pharmacy call with any other refills you may need.  Please continue your efforts at being more active, low cholesterol diet, and weight control.  You are otherwise up to date with prevention measures today.  Please keep your appointments with your specialists as you may have planned  You will be contacted regarding the referral for: colonoscopy  Please go to the LAB in the Basement (turn left off the elevator) for the tests to be done today  You will be contacted by phone if any changes need to be made immediately.  Otherwise, you will receive a letter about your results with an explanation, but please check with MyChart first.  Please remember to sign up for MyChart if you have not done so, as this will be important to you in the future with finding out test results, communicating by private email, and scheduling acute appointments online when needed.  Please return in 1 year for your yearly visit, or sooner if needed 

## 2016-03-09 NOTE — Progress Notes (Signed)
Subjective:    Patient ID: Tami Wood, female    DOB: [birth year], 65 y.o.   MRN: [id number]  HPI  Here for yearly f/u;  Overall doing ok;  Pt denies Chest pain, worsening SOB, DOE, wheezing, orthopnea, PND, worsening LE edema, palpitations, dizziness or syncope.  Pt denies neurological change such as new headache, facial or extremity weakness.  Pt denies polydipsia, polyuria, or low sugar symptoms. Pt states overall good compliance with treatment and medications, good tolerability, and has been trying to follow appropriate diet.  Pt denies worsening depressive symptoms, suicidal ideation or panic. No fever, night sweats, wt loss, loss of appetite, or other constitutional symptoms.  Pt states good ability with ADL's, has low fall risk, home safety reviewed and adequate, no other significant changes in hearing or vision, and only occasionally active with exercise.  Declines immunizations.  Would like colonoscopy for feb 2018.  No other hx changes except for noting a kind of firm nodule to the right buttock area for 1 mo, feels it when she sits down, no pain but just annoying.  No other hx changes Past Medical History:  Diagnosis Date  . ALLERGIC RHINITIS 06/18/2009  . ANEMIA-IRON DEFICIENCY 11/28/2006  . ANXIETY 11/28/2006  . Cough 06/18/2009  . DEPRESSION 11/28/2006  . GERD 12/10/2007  . HYPERTENSION 11/28/2006  . MURMUR 11/28/2006  . PEPTIC ULCER DISEASE 12/10/2007   Past Surgical History:  Procedure Laterality Date  . ABDOMINAL HYSTERECTOMY    . CHOLECYSTECTOMY    . s/p multiple breast biopsy     negative    reports that she has never smoked. She does not have any smokeless tobacco history on file. She reports that she does not drink alcohol. Her drug history is not on file. family history includes Hypertension in her other; Transient ischemic attack in her other. No Known Allergies Current Outpatient Prescriptions on File Prior to Visit  Medication Sig Dispense Refill  . aspirin 81 MG EC tablet  Take 81 mg by mouth daily.      . Biotin 2500 MCG CAPS Take 1 tablet by mouth daily.    Marland Kitchen losartan-hydrochlorothiazide (HYZAAR) 50-12.5 MG tablet 1/2 tab by mouth per day 90 tablet 1  . losartan-hydrochlorothiazide (HYZAAR) 50-12.5 MG tablet Take 1 tablet by mouth daily. 90 tablet 1  . fluticasone (FLONASE) 50 MCG/ACT nasal spray Place 2 sprays into both nostrils at bedtime. (Patient not taking: Reported on 03/09/2016) 16 g 0  . ipratropium (ATROVENT) 0.06 % nasal spray Place 2 sprays into both nostrils 4 (four) times daily. (Patient not taking: Reported on 03/09/2016) 15 mL 12  . Omega-3 Fatty Acids (FISH OIL PO) Take by mouth daily.      . sucralfate (CARAFATE) 1 G tablet Take 1 tablet (1 g total) by mouth 3 (three) times daily with meals. (Patient not taking: Reported on 03/09/2016) 45 tablet 0   No current facility-administered medications on file prior to visit.    Review of Systems Constitutional: Negative for increased diaphoresis, or other activity, appetite or siginficant weight change other than noted HENT: Negative for worsening hearing loss, ear pain, facial swelling, mouth sores and neck stiffness.   Eyes: Negative for other worsening pain, redness or visual disturbance.  Respiratory: Negative for choking or stridor Cardiovascular: Negative for other chest pain and palpitations.  Gastrointestinal: Negative for worsening diarrhea, blood in stool, or abdominal distention Genitourinary: Negative for hematuria, flank pain or change in urine volume.  Musculoskeletal: Negative for myalgias or other joint complaints.  Skin: Negative for other color change and wound or drainage.  Neurological: Negative for syncope and numbness. other than noted Hematological: Negative for adenopathy. or other swelling Psychiatric/Behavioral: Negative for hallucinations, SI, self-injury, decreased concentration or other worsening agitation.  All other system neg per pt    Objective:   Physical Exam BP  140/80   Pulse 92   Temp 98.2 F (36.8 C) (Oral)   Resp 20   Wt 183 lb (83 kg)   SpO2 95%   BMI 30.45 kg/m  VS noted,  Constitutional: Pt is oriented to person, place, and time. Appears well-developed and well-nourished, in no significant distress Head: Normocephalic and atraumatic  Eyes: Conjunctivae and EOM are normal. Pupils are equal, round, and reactive to light Right Ear: External ear normal.  Left Ear: External ear normal Nose: Nose normal.  Mouth/Throat: Oropharynx is clear and moist  Neck: Normal range of motion. Neck supple. No JVD present. No tracheal deviation present or significant neck LA or mass Cardiovascular: Normal rate, regular rhythm, normal heart sounds and intact distal pulses.   Pulmonary/Chest: Effort normal and breath sounds without rales or wheezing  Abdominal: Soft. Bowel sounds are normal. NT. No HSM  Musculoskeletal: Normal range of motion. Exhibits no edema Lymphadenopathy: Has no cervical adenopathy.  Neurological: Pt is alert and oriented to person, place, and time. Pt has normal reflexes. No cranial nerve deficit. Motor grossly intact Skin: Skin is warm and dry. No rash noted or new ulcers; right buttock without skin changes, but has sub1 nodule < 1 cm palpated near the right ischial tuberosity Psychiatric:  Has normal mood and affect. Behavior is normal. Not depressed affect No other new exam findings    Assessment & Plan:

## 2016-03-09 NOTE — Progress Notes (Signed)
Pre visit review using our clinic review tool, if applicable. No additional management support is needed unless otherwise documented below in the visit note. 

## 2016-03-11 ENCOUNTER — Telehealth: Payer: Self-pay | Admitting: Internal Medicine

## 2016-03-11 NOTE — Telephone Encounter (Signed)
Spoke with patient regarding annual wellness appointment. Patient stated that she will give office a call back to schedule appt.

## 2016-03-13 NOTE — Assessment & Plan Note (Signed)
stable overall by history and exam, recent data reviewed with pt, and pt to continue medical treatment as before,  to f/u any worsening symptoms or concerns BP Readings from Last 3 Encounters:  03/09/16 140/80  03/17/15 118/76  02/17/15 110/72

## 2016-03-13 NOTE — Assessment & Plan Note (Signed)
stable overall by history and exam, recent data reviewed with pt, and pt to continue medical treatment as before,  to f/u any worsening symptoms or concerns Lab Results  Component Value Date   HGBA1C 5.4 12/11/2014    

## 2016-03-13 NOTE — Assessment & Plan Note (Signed)
D/w pt, appears benign, continue to monitor

## 2016-03-13 NOTE — Assessment & Plan Note (Signed)
stable overall by history and exam, recent data reviewed with pt, and pt to continue medical treatment as before,  to f/u any worsening symptoms or concerns Lab Results  Component Value Date   WBC 4.0 03/09/2016   HGB 12.5 03/09/2016   HCT 36.5 03/09/2016   PLT 185.0 03/09/2016   GLUCOSE 105 (H) 03/09/2016   CHOL 154 03/09/2016   TRIG 118.0 03/09/2016   HDL 54.50 03/09/2016   LDLCALC 76 03/09/2016   ALT 14 03/09/2016   AST 26 03/09/2016   NA 139 03/09/2016   K 3.9 03/09/2016   CL 104 03/09/2016   CREATININE 0.69 03/09/2016   BUN 11 03/09/2016   CO2 31 03/09/2016   TSH 0.41 03/09/2016   HGBA1C 5.4 12/11/2014

## 2016-03-13 NOTE — Assessment & Plan Note (Signed)

## 2016-04-22 ENCOUNTER — Encounter: Payer: Self-pay | Admitting: Internal Medicine

## 2016-05-09 ENCOUNTER — Encounter: Payer: Self-pay | Admitting: Internal Medicine

## 2016-07-01 ENCOUNTER — Encounter: Payer: Self-pay | Admitting: Internal Medicine

## 2016-07-01 ENCOUNTER — Ambulatory Visit (INDEPENDENT_AMBULATORY_CARE_PROVIDER_SITE_OTHER): Payer: Medicare Other | Admitting: Internal Medicine

## 2016-07-01 VITALS — BP 124/86 | Temp 98.6°F | Ht 65.0 in | Wt 175.0 lb

## 2016-07-01 DIAGNOSIS — R011 Cardiac murmur, unspecified: Secondary | ICD-10-CM | POA: Diagnosis not present

## 2016-07-01 DIAGNOSIS — R739 Hyperglycemia, unspecified: Secondary | ICD-10-CM | POA: Diagnosis not present

## 2016-07-01 DIAGNOSIS — E8809 Other disorders of plasma-protein metabolism, not elsewhere classified: Secondary | ICD-10-CM | POA: Diagnosis not present

## 2016-07-01 DIAGNOSIS — I10 Essential (primary) hypertension: Secondary | ICD-10-CM | POA: Diagnosis not present

## 2016-07-01 DIAGNOSIS — R779 Abnormality of plasma protein, unspecified: Secondary | ICD-10-CM

## 2016-07-01 NOTE — Patient Instructions (Signed)
Please continue all other medications as before, and refills have been done if requested.  Please have the pharmacy call with any other refills you may need.  Please continue your efforts at being more active, low cholesterol diet, and weight control.  You are otherwise up to date with prevention measures today.  Please keep your appointments with your specialists as you may have planned  Please return in 1 year for your yearly visit, or sooner if needed 

## 2016-07-01 NOTE — Progress Notes (Signed)
Pre visit review using our clinic review tool, if applicable. No additional management support is needed unless otherwise documented below in the visit note. 

## 2016-07-01 NOTE — Progress Notes (Signed)
Subjective:    Patient ID: Tami Wood, female    DOB: February 28, 1951, 66 y.o.   MRN: [id number]  HPI  Here to f/u; overall doing ok,  Pt denies chest pain, increasing sob or doe, wheezing, orthopnea, PND, increased LE swelling, palpitations, dizziness or syncope.  Pt denies new neurological symptoms such as new headache, or facial or extremity weakness or numbness.  Pt denies polydipsia, polyuria, or low sugar episode.   Pt denies new neurological symptoms such as new headache, or facial or extremity weakness or numbness.   Pt states overall good compliance with meds, mostly trying to follow appropriate diet, with wt overall stable,  but little exercise however. Wt Readings from Last 3 Encounters:  07/01/16 175 lb (79.4 kg)  03/09/16 183 lb (83 kg)  03/17/15 177 lb (80.3 kg)  Declines immunizations, dxa, and colonoscopy.  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  Past Medical History:  Diagnosis Date  . ALLERGIC RHINITIS 06/18/2009  . ANEMIA-IRON DEFICIENCY 11/28/2006  . ANXIETY 11/28/2006  . Cough 06/18/2009  . DEPRESSION 11/28/2006  . GERD 12/10/2007  . HYPERTENSION 11/28/2006  . MURMUR 11/28/2006  . PEPTIC ULCER DISEASE 12/10/2007   Past Surgical History:  Procedure Laterality Date  . ABDOMINAL HYSTERECTOMY    . CHOLECYSTECTOMY    . s/p multiple breast biopsy     negative    reports that she has never smoked. She has never used smokeless tobacco. She reports that she does not drink alcohol. Her drug history is not on file. family history includes Hypertension in her other; Transient ischemic attack in her other. No Known Allergies Current Outpatient Prescriptions on File Prior to Visit  Medication Sig Dispense Refill  . aspirin 81 MG EC tablet Take 81 mg by mouth daily.      . Biotin 2500 MCG CAPS Take 1 tablet by mouth daily.    . fluticasone (FLONASE) 50 MCG/ACT nasal spray Place 2 sprays into both nostrils at bedtime. 16 g 0  . ipratropium (ATROVENT)  0.06 % nasal spray Place 2 sprays into both nostrils 4 (four) times daily. 15 mL 12  . losartan-hydrochlorothiazide (HYZAAR) 50-12.5 MG tablet 1/2 tab by mouth per day 90 tablet 1  . Omega-3 Fatty Acids (FISH OIL PO) Take by mouth daily.      . sucralfate (CARAFATE) 1 G tablet Take 1 tablet (1 g total) by mouth 3 (three) times daily with meals. 45 tablet 0   No current facility-administered medications on file prior to visit.    Review of Systems  Constitutional: Negative for other unusual diaphoresis or sweats HENT: Negative for ear discharge or swelling Eyes: Negative for other worsening visual disturbances Respiratory: Negative for stridor or other swelling  Gastrointestinal: Negative for worsening distension or other blood Genitourinary: Negative for retention or other urinary change Musculoskeletal: Negative for other MSK pain or swelling Skin: Negative for color change or other new lesions Neurological: Negative for worsening tremors and other numbness  Psychiatric/Behavioral: Negative for worsening agitation or other fatigue     Objective:   Physical Exam BP 124/86   Temp 98.6 F (37 C) (Oral)   Ht 5\' 5"  (1.651 m)   Wt 175 lb (79.4 kg)   BMI 29.12 kg/m  VS noted,  Constitutional: Pt appears in NAD HENT: Head: NCAT.  Right Ear: External ear normal.  Left Ear: External ear normal.  Eyes: . Pupils are equal, round, and reactive to light. Conjunctivae  and EOM are normal Nose: without d/c or deformity Neck: Neck supple. Gross normal ROM Cardiovascular: Normal rate and regular rhythm.   gr 2/6 sys RUSB murmur Pulmonary/Chest: Effort normal and breath sounds without rales or wheezing.  Abd:  Soft, NT, ND, + BS, no organomegaly Neurological: Pt is alert. At baseline orientation, motor grossly intact Skin: Skin is warm. No rashes, other new lesions, no LE edema Psychiatric: Pt behavior is normal without agitation   Lab Results  Component Value Date   WBC 4.0 03/09/2016    HGB 12.5 03/09/2016   HCT 36.5 03/09/2016   PLT 185.0 03/09/2016   GLUCOSE 105 (H) 03/09/2016   CHOL 154 03/09/2016   TRIG 118.0 03/09/2016   HDL 54.50 03/09/2016   LDLCALC 76 03/09/2016   ALT 14 03/09/2016   AST 26 03/09/2016   NA 139 03/09/2016   K 3.9 03/09/2016   CL 104 03/09/2016   CREATININE 0.69 03/09/2016   BUN 11 03/09/2016   CO2 31 03/09/2016   TSH 0.41 03/09/2016   HGBA1C 5.4 12/11/2014        Assessment & Plan:

## 2016-07-03 DIAGNOSIS — R779 Abnormality of plasma protein, unspecified: Secondary | ICD-10-CM | POA: Insufficient documentation

## 2016-07-03 DIAGNOSIS — E8809 Other disorders of plasma-protein metabolism, not elsewhere classified: Secondary | ICD-10-CM | POA: Insufficient documentation

## 2016-07-03 NOTE — Assessment & Plan Note (Signed)
stable overall by history and exam, recent data reviewed with pt, and pt to continue medical treatment as before,  to f/u any worsening symptoms or concerns Lab Results  Component Value Date   HGBA1C 5.4 12/11/2014

## 2016-07-03 NOTE — Assessment & Plan Note (Signed)
stable overall by history and exam, recent data reviewed with pt, and pt to continue medical treatment as before,  to f/u any worsening symptoms or concerns BP Readings from Last 3 Encounters:  07/01/16 124/86  03/09/16 140/80  03/17/15 118/76

## 2016-07-03 NOTE — Assessment & Plan Note (Signed)
Etiology unclear, for SPEP with next lab draw

## 2016-07-03 NOTE — Assessment & Plan Note (Signed)
asympt, declines echo,  to f/u any worsening symptoms or concerns

## 2016-07-13 ENCOUNTER — Telehealth: Payer: Self-pay

## 2016-07-13 MED ORDER — LOSARTAN POTASSIUM-HCTZ 50-12.5 MG PO TABS: ORAL_TABLET | ORAL | 1 refills | Status: DC

## 2016-07-13 NOTE — Telephone Encounter (Signed)
Pt stopped by stating that her BP med was not sent in during her most recent OV.

## 2016-07-14 ENCOUNTER — Other Ambulatory Visit: Payer: Self-pay

## 2016-07-14 MED ORDER — LOSARTAN POTASSIUM-HCTZ 50-12.5 MG PO TABS: ORAL_TABLET | ORAL | 1 refills | Status: DC

## 2016-11-30 ENCOUNTER — Encounter: Payer: Self-pay | Admitting: Women's Health

## 2016-11-30 DIAGNOSIS — Z803 Family history of malignant neoplasm of breast: Secondary | ICD-10-CM | POA: Diagnosis not present

## 2016-11-30 DIAGNOSIS — Z1231 Encounter for screening mammogram for malignant neoplasm of breast: Secondary | ICD-10-CM | POA: Diagnosis not present

## 2017-05-11 DIAGNOSIS — M25561 Pain in right knee: Secondary | ICD-10-CM | POA: Diagnosis not present

## 2017-07-08 ENCOUNTER — Other Ambulatory Visit: Payer: Self-pay | Admitting: Internal Medicine

## 2017-08-16 ENCOUNTER — Encounter: Payer: Self-pay | Admitting: Internal Medicine

## 2017-08-16 ENCOUNTER — Other Ambulatory Visit (INDEPENDENT_AMBULATORY_CARE_PROVIDER_SITE_OTHER): Payer: Medicare Other

## 2017-08-16 ENCOUNTER — Ambulatory Visit (INDEPENDENT_AMBULATORY_CARE_PROVIDER_SITE_OTHER): Payer: Medicare Other | Admitting: Internal Medicine

## 2017-08-16 ENCOUNTER — Ambulatory Visit (INDEPENDENT_AMBULATORY_CARE_PROVIDER_SITE_OTHER)
Admission: RE | Admit: 2017-08-16 | Discharge: 2017-08-16 | Disposition: A | Discharge status: Home / Self Care | Payer: Medicare Other | Source: Ambulatory Visit | Attending: Internal Medicine | Admitting: Internal Medicine

## 2017-08-16 VITALS — BP 136/90 | HR 85 | Temp 98.2°F | Ht 65.0 in | Wt 175.0 lb

## 2017-08-16 DIAGNOSIS — R102 Pelvic and perineal pain: Secondary | ICD-10-CM | POA: Diagnosis not present

## 2017-08-16 DIAGNOSIS — D509 Iron deficiency anemia, unspecified: Secondary | ICD-10-CM | POA: Diagnosis not present

## 2017-08-16 DIAGNOSIS — F411 Generalized anxiety disorder: Secondary | ICD-10-CM | POA: Diagnosis not present

## 2017-08-16 DIAGNOSIS — M25552 Pain in left hip: Secondary | ICD-10-CM | POA: Diagnosis not present

## 2017-08-16 DIAGNOSIS — I10 Essential (primary) hypertension: Secondary | ICD-10-CM | POA: Diagnosis not present

## 2017-08-16 DIAGNOSIS — R739 Hyperglycemia, unspecified: Secondary | ICD-10-CM

## 2017-08-16 LAB — LIPID PANEL
CHOL/HDL RATIO: 3
Cholesterol: 152 mg/dL (ref 0–200)
HDL: 51.3 mg/dL (ref >39.00)
LDL Cholesterol: 83 mg/dL (ref 0–99)
NONHDL: 100.74
TRIGLYCERIDES: 90 mg/dL (ref 0.0–149.0)
VLDL: 18 mg/dL (ref 0.0–40.0)

## 2017-08-16 LAB — CBC WITH DIFFERENTIAL/PLATELET
BASOS PCT: 0.8 % (ref 0.0–3.0)
Basophils Absolute: 0 10*3/uL (ref 0.0–0.1)
EOS PCT: 1.7 % (ref 0.0–5.0)
Eosinophils Absolute: 0.1 10*3/uL (ref 0.0–0.7)
HCT: 36.3 % (ref 36.0–46.0)
Hemoglobin: 12.2 g/dL (ref 12.0–15.0)
LYMPHS ABS: 2 10*3/uL (ref 0.7–4.0)
Lymphocytes Relative: 48 % — ABNORMAL HIGH (ref 12.0–46.0)
MCHC: 33.6 g/dL (ref 30.0–36.0)
MCV: 89.3 fl (ref 78.0–100.0)
MONO ABS: 0.5 10*3/uL (ref 0.1–1.0)
Monocytes Relative: 13.2 % — ABNORMAL HIGH (ref 3.0–12.0)
NEUTROS ABS: 1.5 10*3/uL (ref 1.4–7.7)
NEUTROS PCT: 36.3 % — AB (ref 43.0–77.0)
PLATELETS: 178 10*3/uL (ref 150.0–400.0)
RBC: 4.06 Mil/uL (ref 3.87–5.11)
RDW: 13.6 % (ref 11.5–15.5)
WBC: 4.1 10*3/uL (ref 4.0–10.5)

## 2017-08-16 LAB — BASIC METABOLIC PANEL
BUN: 9 mg/dL (ref 6–23)
CO2: 33 meq/L — AB (ref 19–32)
Calcium: 9.3 mg/dL (ref 8.4–10.5)
Chloride: 103 mEq/L (ref 96–112)
Creatinine, Ser: 0.75 mg/dL (ref 0.40–1.20)
GFR: 98.65 mL/min (ref >60.00)
Glucose, Bld: 118 mg/dL — ABNORMAL HIGH (ref 70–99)
POTASSIUM: 3.6 meq/L (ref 3.5–5.1)
SODIUM: 138 meq/L (ref 135–145)

## 2017-08-16 LAB — HEPATIC FUNCTION PANEL
ALBUMIN: 3.8 g/dL (ref 3.5–5.2)
ALT: 12 U/L (ref 0–35)
AST: 20 U/L (ref 0–37)
Alkaline Phosphatase: 114 U/L (ref 39–117)
BILIRUBIN TOTAL: 0.3 mg/dL (ref 0.2–1.2)
Bilirubin, Direct: 0 mg/dL (ref 0.0–0.3)
Total Protein: 8.7 g/dL — ABNORMAL HIGH (ref 6.0–8.3)

## 2017-08-16 LAB — HEMOGLOBIN A1C: Hgb A1c MFr Bld: 5.3 % (ref 4.6–6.5)

## 2017-08-16 MED ORDER — LOSARTAN POTASSIUM-HCTZ 50-12.5 MG PO TABS: ORAL_TABLET | ORAL | 3 refills | Status: DC

## 2017-08-16 NOTE — Assessment & Plan Note (Addendum)
Etiology unclear, ok for pelvic films, tylenol prn,  to f/u any worsening symptoms or concerns  Note:  Total time for pt hx, exam, review of record with pt in the room, determination of diagnoses and plan for further eval and tx is > 40 min, with over 50% spent in coordination and counseling of patient including the differential dx, tx, further evaluation and other management of pelvic pain at right ischial tuberosity, hyperglycemia, anxiety,, HTN

## 2017-08-16 NOTE — Assessment & Plan Note (Signed)
Also for cbc with labs, no overt bleeding 

## 2017-08-16 NOTE — Assessment & Plan Note (Signed)
Lab Results  Component Value Date   HGBA1C 5.4 12/11/2014  stable overall by history and exam, recent data reviewed with pt, and pt to continue medical treatment as before,  to f/u any worsening symptoms or concerns, for f/u with labs

## 2017-08-16 NOTE — Patient Instructions (Signed)
Please continue all other medications as before, and refills have been done if requested.  Please have the pharmacy call with any other refills you may need.  Please continue your efforts at being more active, low cholesterol diet, and weight control.  You are otherwise up to date with prevention measures today.  Please keep your appointments with your specialists as you may have planned  Please go to the XRAY Department in the Basement (go straight as you get off the elevator) for the x-ray testing  Please go to the LAB in the Basement (turn left off the elevator) for the tests to be done today  You will be contacted by phone if any changes need to be made immediately.  Otherwise, you will receive a letter about your results with an explanation, but please check with MyChart first.  Please remember to sign up for MyChart if you have not done so, as this will be important to you in the future with finding out test results, communicating by private email, and scheduling acute appointments online when needed.  Please return in 1 year for your yearly visit, or sooner if needed 

## 2017-08-16 NOTE — Progress Notes (Signed)
Subjective:    Patient ID: Tami Wood, female    DOB: [birth year], 67 y.o.   MRN: [id number]  HPI  Here for yearly f/u;  Overall doing ok;  Pt denies Chest pain, worsening SOB, DOE, wheezing, orthopnea, PND, worsening LE edema, palpitations, dizziness or syncope.  Pt denies neurological change such as new headache, facial or extremity weakness.  Pt denies polydipsia, polyuria, or low sugar symptoms. Pt states overall good compliance with treatment and medications, good tolerability, and has been trying to follow appropriate diet.  Pt denies worsening depressive symptoms, suicidal ideation or panic, but has ongoing anxiety. No fever, night sweats, wt loss, loss of appetite, or other constitutional symptoms.  Pt states good ability with ADL's, has low fall risk, home safety reviewed and adequate, no other significant changes in hearing or vision, and not active with exercise. Has appt with GYn June 4.  Declines Tdap, pneumonia, colonscopy  Does c/o lump she can feel now larger than a few months to right ischial area but states she cannot feel by palpation, only can tell by sitting.   Past Medical History:  Diagnosis Date  . ALLERGIC RHINITIS 06/18/2009  . ANEMIA-IRON DEFICIENCY 11/28/2006  . ANXIETY 11/28/2006  . Cough 06/18/2009  . DEPRESSION 11/28/2006  . GERD 12/10/2007  . HYPERTENSION 11/28/2006  . MURMUR 11/28/2006  . PEPTIC ULCER DISEASE 12/10/2007   Past Surgical History:  Procedure Laterality Date  . ABDOMINAL HYSTERECTOMY    . CHOLECYSTECTOMY    . s/p multiple breast biopsy     negative    reports that she has never smoked. She has never used smokeless tobacco. She reports that she does not drink alcohol. Her drug history is not on file. family history includes Hypertension in her other; Transient ischemic attack in her other. No Known Allergies Current Outpatient Medications on File Prior to Visit  Medication Sig Dispense Refill  . aspirin 81 MG EC tablet Take 81 mg by mouth daily.      .  Biotin 2500 MCG CAPS Take 1 tablet by mouth daily.    . fluticasone (FLONASE) 50 MCG/ACT nasal spray Place 2 sprays into both nostrils at bedtime. 16 g 0  . ipratropium (ATROVENT) 0.06 % nasal spray Place 2 sprays into both nostrils 4 (four) times daily. 15 mL 12  . Omega-3 Fatty Acids (FISH OIL PO) Take by mouth daily.      . sucralfate (CARAFATE) 1 G tablet Take 1 tablet (1 g total) by mouth 3 (three) times daily with meals. 45 tablet 0   No current facility-administered medications on file prior to visit.    Review of Systems  Constitutional: Negative for other unusual diaphoresis or sweats HENT: Negative for ear discharge or swelling Eyes: Negative for other worsening visual disturbances Respiratory: Negative for stridor or other swelling  Gastrointestinal: Negative for worsening distension or other blood Genitourinary: Negative for retention or other urinary change Musculoskeletal: Negative for other MSK pain or swelling Skin: Negative for color change or other new lesions Neurological: Negative for worsening tremors and other numbness  Psychiatric/Behavioral: Negative for worsening agitation or other fatigue All other system neg per pt    Objective:   Physical Exam BP 136/90   Pulse 85   Temp 98.2 F (36.8 C) (Oral)   Ht 5\' 5"  (1.651 m)   Wt 175 lb (79.4 kg)   SpO2 97%   BMI 29.12 kg/m  VS noted,  Constitutional: Pt appears in NAD HENT: Head: NCAT.  Right  Ear: External ear normal.  Left Ear: External ear normal.  Eyes: . Pupils are equal, round, and reactive to light. Conjunctivae and EOM are normal Nose: without d/c or deformity Neck: Neck supple. Gross normal ROM Cardiovascular: Normal rate and regular rhythm.   Pulmonary/Chest: Effort normal and breath sounds without rales or wheezing.  Abd:  Soft, NT, ND, + BS, no organomegaly, no mass palpated right ischial tuberosity area Neurological: Pt is alert. At baseline orientation, motor grossly intact Skin: Skin is  warm. No rashes, other new lesions, no LE edema Psychiatric: Pt behavior is normal without agitation , 2+ nervous No other exam findings    Assessment & Plan:

## 2017-08-16 NOTE — Assessment & Plan Note (Signed)
Mild chronic persistent, declines need for change in tx today

## 2017-08-16 NOTE — Assessment & Plan Note (Signed)
stable overall by history and exam, recent data reviewed with pt, and pt to continue medical treatment as before,  to f/u any worsening symptoms or concerns BP Readings from Last 3 Encounters:  08/16/17 136/90  07/01/16 124/86  03/09/16 140/80

## 2017-08-17 LAB — URINALYSIS, ROUTINE W REFLEX MICROSCOPIC
Bilirubin Urine: NEGATIVE
Hgb urine dipstick: NEGATIVE
KETONES UR: NEGATIVE
Leukocytes, UA: NEGATIVE
Nitrite: NEGATIVE
PH: 7 (ref 5.0–8.0)
RBC / HPF: NONE SEEN (ref 0–?)
Specific Gravity, Urine: 1.02 (ref 1.000–1.030)
TOTAL PROTEIN, URINE-UPE24: NEGATIVE
URINE GLUCOSE: NEGATIVE
UROBILINOGEN UA: 0.2 (ref 0.0–1.0)

## 2017-08-17 LAB — TSH: TSH: 0.47 u[IU]/mL (ref 0.35–4.50)

## 2017-08-31 ENCOUNTER — Telehealth: Payer: Self-pay | Admitting: Internal Medicine

## 2017-08-31 NOTE — Telephone Encounter (Signed)
Copied from [place] [phone]. Topic: Conservator, museum/gallery Patient >> Aug 29, 2017  2:42 PM Peace, Tammy L wrote:  Left patient vm to call back to schedule new patient appt with Dr. Tamala Julian for pelvic pain.  >> Aug 31, 2017 10:30 AM Cleaster Corin, NT wrote: Pt. stated that she has found another provider

## 2017-09-08 ENCOUNTER — Encounter: Payer: Self-pay | Admitting: Internal Medicine

## 2017-09-12 DIAGNOSIS — Z01419 Encounter for gynecological examination (general) (routine) without abnormal findings: Secondary | ICD-10-CM | POA: Diagnosis not present

## 2017-09-12 DIAGNOSIS — R102 Pelvic and perineal pain: Secondary | ICD-10-CM | POA: Diagnosis not present

## 2017-09-19 ENCOUNTER — Encounter: Payer: Self-pay | Admitting: Internal Medicine

## 2017-10-31 ENCOUNTER — Telehealth: Payer: Self-pay | Admitting: Emergency Medicine

## 2017-10-31 NOTE — Telephone Encounter (Signed)
Called patient to schedule AWV. Patient declined at this time. 

## 2017-11-20 ENCOUNTER — Telehealth: Payer: Self-pay | Admitting: Internal Medicine

## 2017-11-20 MED ORDER — AMOXICILLIN 500 MG PO CAPS: 1000.0000 mg | ORAL_CAPSULE | Freq: Two times a day (BID) | ORAL | 0 refills | Status: DC

## 2017-11-20 NOTE — Telephone Encounter (Signed)
Copied from [place]. Topic: Quick Communication - See Telephone Encounter >> Nov 20, 2017  3:59 PM Nils Flack wrote: CRM for notification. See Telephone encounter for: 11/20/17. Pt needs to bring in a from for dental surgery, and also needs to have a rx for amoxicillin per her dentist because there is an infection in her tooth.  The dentist no longer prescribes abx and told pt to ask her pcp for this.   Pt states she will be by tomorrow to drop off paperwork that needs to be filled out.  Pt dental surgery is on Wednesday  Cb is [phone]

## 2017-11-20 NOTE — Addendum Note (Signed)
Addended by: Biagio Borg on: 11/20/2017 05:00 PM   Modules accepted: Orders

## 2017-11-20 NOTE — Telephone Encounter (Signed)
Antibiotic done erx  Will fill out form when arrives

## 2017-11-21 NOTE — Telephone Encounter (Signed)
Provider has signed the form, Copy sent to scan.   Patient inform and will pick up original.

## 2017-11-21 NOTE — Telephone Encounter (Signed)
I dont think I have the form mentioned in my work area. thanks

## 2017-11-21 NOTE — Telephone Encounter (Signed)
Patient has dropped off form to be signed. Form has been placed in providers box.

## 2017-11-30 DIAGNOSIS — Z1231 Encounter for screening mammogram for malignant neoplasm of breast: Secondary | ICD-10-CM | POA: Diagnosis not present

## 2017-11-30 DIAGNOSIS — Z803 Family history of malignant neoplasm of breast: Secondary | ICD-10-CM | POA: Diagnosis not present

## 2017-11-30 LAB — HM MAMMOGRAPHY

## 2017-12-11 DIAGNOSIS — R002 Palpitations: Secondary | ICD-10-CM | POA: Diagnosis not present

## 2017-12-11 DIAGNOSIS — I340 Nonrheumatic mitral (valve) insufficiency: Secondary | ICD-10-CM | POA: Diagnosis not present

## 2017-12-11 DIAGNOSIS — Z298 Encounter for other specified prophylactic measures: Secondary | ICD-10-CM | POA: Diagnosis not present

## 2017-12-11 DIAGNOSIS — I10 Essential (primary) hypertension: Secondary | ICD-10-CM | POA: Diagnosis not present

## 2017-12-25 DIAGNOSIS — R0989 Other specified symptoms and signs involving the circulatory and respiratory systems: Secondary | ICD-10-CM | POA: Diagnosis not present

## 2017-12-25 DIAGNOSIS — R011 Cardiac murmur, unspecified: Secondary | ICD-10-CM | POA: Diagnosis not present

## 2017-12-29 ENCOUNTER — Ambulatory Visit: Payer: Medicare Other | Admitting: Cardiology

## 2018-01-03 DIAGNOSIS — Z298 Encounter for other specified prophylactic measures: Secondary | ICD-10-CM | POA: Diagnosis not present

## 2018-01-03 DIAGNOSIS — R0989 Other specified symptoms and signs involving the circulatory and respiratory systems: Secondary | ICD-10-CM | POA: Diagnosis not present

## 2018-01-03 DIAGNOSIS — I10 Essential (primary) hypertension: Secondary | ICD-10-CM | POA: Diagnosis not present

## 2018-01-03 DIAGNOSIS — I340 Nonrheumatic mitral (valve) insufficiency: Secondary | ICD-10-CM | POA: Diagnosis not present

## 2018-01-05 ENCOUNTER — Other Ambulatory Visit: Payer: Self-pay | Admitting: Internal Medicine

## 2018-01-05 DIAGNOSIS — M25551 Pain in right hip: Secondary | ICD-10-CM | POA: Diagnosis not present

## 2018-01-19 DIAGNOSIS — I340 Nonrheumatic mitral (valve) insufficiency: Secondary | ICD-10-CM | POA: Diagnosis not present

## 2018-01-29 DIAGNOSIS — I10 Essential (primary) hypertension: Secondary | ICD-10-CM | POA: Diagnosis not present

## 2018-02-04 DIAGNOSIS — I340 Nonrheumatic mitral (valve) insufficiency: Secondary | ICD-10-CM | POA: Diagnosis present

## 2018-02-04 NOTE — H&P (Signed)
Tami Wood 2018/01/27 10:30 AM Location: [facility] Patient #: [id number] DOB: 13-Apr-1950 Divorced / Language: Other / Race: Black or African American Female   History of Present Illness Tami Wood; 01/04/2018 5:56 AM) Patient words: Last OV 12/11/2017; f/u for echo & carotid.  The patient is a 67 year old female who presents for a Follow-up for Heart murmur. Patient with hypertension and osteoarthritis recently evaluated by [place] for pansystolic mitral murmur.  She underwent echocardiogram and carotid duplex and now presents to discuss results.  Patient reports that she was born with this murmur, but has not previously had any evaluation. She has minimal dyspnea on exertion that she attributes to not be actvie for the last few months. She was previously walking in the park without difficulty until a few months ago and due to safety concerns has quit walking there. She did not notice any dyspnea at that time. No leg edema. 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 She does occasionally have palpitations that she describes as awareness of her heartbeat and mostly notices this while lying in bed at night. No heart racing. Denies any history of diabetes, hyperlipidemia, or thyroid disorders. No former tobacco use.  Patient has not had regular dental cleanings or any dental procedures since she was a child. She is scheduled for tooth extractions on 10/11.   Problem List/Past Medical Tami Wood; 01/27/2018 10:34 AM) Benign essential hypertension (I10)  EKG 12/11/2017: Normal sinus rhythm at 80 bpm, normal axis, no evidence of ischemia. Normal EKG. Laboratory examination (Z01.89)  08/16/2017: Potassium 3.6, creatinine 0.75, EGFR 98, CMP normal. Cholesterol 152, triglycerides 90, HDL 51, LDL 83. CBC normal. Hemoglobin A1c 5.3%. SBE (subacute bacterial endocarditis) prophylaxis candidate (Z29.8)  Bilateral carotid  bruits (R09.89)  Carotid artery duplex 12/25/2017: Stenosis in the left internal carotid artery (1-15%). Antegrade right vertebral artery flow. Antegrade left vertebral artery flow. F/U studies if clincally indicated. Severe mitral regurgitation (I34.0)  Echocardiogram 12/25/2017: Left ventricle cavity is normal in size. Normal global wall motion. Diastolic function assessment limited due to severity of mitral regurgitation. Calculated EF 55%. Left atrial cavity is severely dilated. Myxomatous degeneration with posterior leaflet prolapse. Severe anteriorly directed mitral regurgitation. Mild tricuspid regurgitation. Estimated pulmonary artery systolic pressure 28 mmHg.  Allergies Tami Wood; 2018/01/27 10:34 AM) No Known Drug Allergies [12/11/2017]:  Family History Tami Wood; 2018-01-27 10:34 AM) Mother  Deceased. at age 23; scar tissue on lungs; No heart issues Father  In stable health. dementia; NO heart issues Sister 2  In good health. older; No heart issues Brother 1  Deceased. No heart issues  Social History Tami Wood; 01-27-18 10:34 AM) Current tobacco use  Never smoker. Non Drinker/No Alcohol Use  Marital status  Single. Living Situation  Lives alone. Number of Children  0.  Past Surgical History Tami Furbish Tami Wood; 01-27-18 10:34 AM) Gallbladder Surgery  Hysterectomy (not due to cancer) - Partial   Medication History Tami Wood; 2018/01/27 10:41 AM) Amoxicillin (500MG Tablet, 4 Tablet Oral take 1 hour before dental procedure, Taken starting 12/12/2017) Active. Losartan Potassium-HCTZ (50-12.5MG Tablet, 1/2 Oral daily) Active. Centrum Adults (1 Oral daily) Active. Aspirin (81MG Capsule, 1 Oral daily) Active. Medications Reconciled (verbally no meds present)  Diagnostic Studies History Tami Wood; 01/27/2018 10:34 AM) Echocardiogram  12/25/2017: Left ventricle cavity is normal in size. Normal global wall motion. Diastolic function  assessment limited due to severity of mitral regurgitation. Calculated EF 55%. Left atrial  cavity is severely dilated. Myxomatous degeneration with posterior leaflet prolapse. Severe anteriorly directed mitral regurgitation. Mild tricuspid regurgitation. Estimated pulmonary artery systolic pressure 28 mmHg. Carotid Doppler  12/25/2017: Stenosis in the left internal carotid artery (1-15%). Antegrade right vertebral artery flow. Antegrade left vertebral artery flow. F/U studies if clincally indicated.    Review of Systems Tami Maine, Wood; 01/04/2018 5:56 AM) General Not Present- Appetite Loss and Weight Gain. Respiratory Not Present- Chronic Cough and Wakes up from Sleep Wheezing or Short of Breath. Cardiovascular Present- Difficulty Breathing On Exertion and Palpitations. Not Present- Chest Pain, Difficulty Breathing Lying Down and Edema. Gastrointestinal Not Present- Black, Tarry Stool and Difficulty Swallowing. Musculoskeletal Not Present- Decreased Range of Motion and Muscle Atrophy. Neurological Not Present- Attention Deficit. Psychiatric Not Present- Personality Changes and Suicidal Ideation. Endocrine Not Present- Cold Intolerance and Heat Intolerance. Hematology Not Present- Abnormal Bleeding. All other systems negative  Vitals Tami Wood; 01/03/2018 10:43 AM) 01/03/2018 10:38 AM Weight: 173 lb Height: 65in Body Surface Area: 1.86 m Body Mass Index: 28.79 kg/m  Pulse: 114 (Regular)  P.OX: 93% (Room air) BP: 120/78 (Sitting, Left Arm, Standard)       Physical Exam Tami Maine, Wood; 01/04/2018 5:56 AM) General Mental Status-Alert. General Appearance-Cooperative and Appears stated age. Build & Nutrition-Moderately built.  Head and Neck Thyroid Gland Characteristics - normal size and consistency and no palpable nodules.  Chest and Lung Exam Chest and lung exam reveals -quiet, even and easy respiratory effort with no use of  accessory muscles, non-tender and on auscultation, normal breath sounds, no adventitious sounds.  Cardiovascular Cardiovascular examination reveals -carotid auscultation reveals no bruits, abdominal aorta auscultation reveals no bruits and no prominent pulsation, femoral artery auscultation bilaterally reveals normal pulses, no bruits, no thrills and normal pedal pulses bilaterally. Auscultation Rhythm - Frequent Ectopy. Murmurs & Other Heart Sounds: Murmur - Location - Aortic Area(heard throughout). Timing - Holosystolic. Grade - III/VI. Radiation - Left axilla.  Abdomen Palpation/Percussion Normal exam - Non Tender and No hepatosplenomegaly.  Peripheral Vascular Carotid arteries - Bilateral-Carotid bruit.  Neurologic Neurologic evaluation reveals -alert and oriented x 3 with no impairment of recent or remote memory. Motor-Grossly intact without any focal deficits.  Musculoskeletal Global Assessment Left Lower Extremity - no deformities, masses or tenderness, no known fractures. Right Lower Extremity - no deformities, masses or tenderness, no known fractures.   Results Tami Wood; 01/04/2018 5:56 AM) Procedures  Name Value Date Complete duplex scan of bilateral carotid arteries (62947) Comments: Carotid artery duplex 12/25/2017: Stenosis in the left internal carotid artery (1-15%). Antegrade right vertebral artery flow. Antegrade left vertebral artery flow. F/U studies if clincally indicated.  Performed: 12/25/2017 4:36 PM Echocardiography, transthoracic, real-time with image documentation (2D), includes M-mode recording, when performed, complete, with spectral Doppler echocardiography, and with color flow Doppler echocardiography ([id number]) Comments: Echocardiogram 12/25/2017: Left ventricle cavity is normal in size. Normal global wall motion. Diastolic function assessment limited due to severity of mitral regurgitation. Calculated EF 55%. Left atrial cavity  is severely dilated. Myxomatous degeneration with posterior leaflet prolapse. Severe anteriorly directed mitral regurgitation. Mild tricuspid regurgitation. Estimated pulmonary artery systolic pressure 28 mmHg.  Performed: 12/25/2017 4:36 PM    Assessment & Plan Tami Wood; 01/04/2018 5:56 AM) Severe mitral regurgitation (I34.0) Story: Echocardiogram 12/25/2017: Left ventricle cavity is normal in size. Normal global wall motion. Diastolic function assessment limited due to severity of mitral regurgitation. Calculated EF 55%. Left atrial cavity is severely dilated. Myxomatous degeneration with posterior  leaflet prolapse. Severe anteriorly directed mitral regurgitation. Mild tricuspid regurgitation. Estimated pulmonary artery systolic pressure 28 mmHg. Benign essential hypertension (I10) Story: EKG 12/11/2017: Normal sinus rhythm at 80 bpm, normal axis, no evidence of ischemia. Normal EKG. SBE (subacute bacterial endocarditis) prophylaxis candidate (Z29.8) Bilateral carotid bruits (R09.89) Story: Carotid artery duplex 12/25/2017: Stenosis in the left internal carotid artery (1-15%). Antegrade right vertebral artery flow. Antegrade left vertebral artery flow. F/U studies if clincally indicated. Laboratory examination (Z01.89) Story: 08/16/2017: Potassium 3.6, creatinine 0.75, EGFR 98, CMP normal. Cholesterol 152, triglycerides 90, HDL 51, LDL 83. CBC normal. Hemoglobin A1c 5.3%.  Note:. Recommendation:  Patient went hypertension and osteoarthritis, presents for evaluation of murmur and if endocarditis prophylaxis is needed prior to upcoming dental procedure.  Patient presents for follow-up after testing. Had minimal carotid disease in her left internal carotid. We'll repeat carotid duplex if clinically indicated in the future. Of greatest concern, she is noted to have severe MR by echocardiogram. Has normal LVEF. She has minimal symptoms; however, is not very active and is  difficult to differentiate her symptoms. We'll place her on routine treadmill stress test to evaluate her exercise capacity. If exercise capacity is decreased, she will likely need surgical repair. We'll further discuss at her next office visit. I'll see her back after stress test and make further recommendations and evaluation.  She is scheduled to have dental procedure on Friday 01/05/2018. She needs endocarditis prophylaxis to duplex also numbness degeneration and patient is aware and has her prescription.  *I have discussed this case with Dr. Virgina Jock and he personally examined the patient and participated in formulating the plan.*  CC: Cathlean Cower, MD    Signed by Tami Maine, Wood (01/04/2018 5:57 AM)   [name] 01/19/2018 2:00 PM Location: [facility] Patient #: [id number] DOB: 08/18/1950 Divorced / Language: Other / Race: Black or African American Female   History of Present Illness Nigel Mormon MD; 01/20/2018 11:59 AM) The patient is a 67 year old female presenting to discuss test results. 67 y/o [place] American female with posterior leaflet prolapse, severe anteriorly directed mitral regurgitation.  Patient was here today for exercise treadmill stress test to assess her symotoms. Patient had poor exercise capacity, limited due to shrotness of breath. Exercise EKG was abnornal with inferolateral ST depressions.   Addendum: Telephone encounter   Assessment & Plan Joya Gaskins Esther Hardy MD; 01/20/2018 12:00 PM)  Note:67 y/o [place] American female with posterior leaflet prolapse, severe anteriorly directed mitral regurgitation, abnormal exercise treadmill stress test.  I discussed the results with the patient. I believe this is symptomatic severe mitral regurgitation, with abnormal stress test indicating either obstructive coronary arery diease or supply demand mismatch in the setting of severe MR.  I recommended patient to undergo TEE,  right and left heart catheterization to evaluate coronary anatomy and pre-op evaluation for possible mitral valve repair. Given her now evident symptoms of exertional dyspnea, I think she will do better with mitral valve repair. I have referred her to Dr. Roxy Manns for consideration for mitral valve rapir.  Cc Cathlean Cower, MD  Signed electronically by Nigel Mormon, MD (01/20/2018 12:02 PM)

## 2018-02-04 NOTE — H&P (Deleted)
Tami Wood 02/01/2018 11:30 AM Location: [facility] Patient #: [id number] DOB: 12/23/1960 Undefined / Language: Tami Wood / Race: Undefined Female   History of Present Illness Tami Maine FNP-C; 02/02/2018 10:02 AM) Patient words: NP Eval for sob, blockage.  The patient is a 67 year old female who presents with chest pain. 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 He presents as a self-referral for evaluation and management of CAD.  He reports constant substernal chest pain that worsens with exertion. His biggest complaint is dyspnea on exertion and markedly decreased exercise tolerance progressively worsening over last year. Currently does not take a statin or ASA. He denies any history of hypertension or diabetes.  Patient has had intermittent left arm swelling over the last several years. He was seen at [facility] for left upper extremity edema and chest pain and underwent stress testing as well at that time that was normal. He was later discharged home. Reports the next day, he developed worsening symptoms and went to [facility] and reports he was told it was a blood clot in his left arm vein. He states that he is confused about the cause of his left arm swelling.  He has PPM in place since 06-10-88 after syncopal episode. Last interrogation was in August 2019. He does have rheumatoid arthritis being managed by Dr. Posey Pronto.  No tobacco use history. Does have family history of CAD in his father and 2 siblings.     Problem List/Past Medical ([facility]; 02/01/2018 11:12 AM) Blood Clot in Left Arm [2019]: Blood clot is in vien, causes arm to swell. Hospital Visit with Chest Pain [12/2017]: Blockage in main artery in  heart, two other small blockages as well. Arthritis  Pacemaker  Was placed in March 25, 1989. Was replaced in July 1996 and October 2009.  Allergies Tami Wood [place]; 02/01/2018 11:14 AM) Flomax *GENITOURINARY AGENTS - MISCELLANEOUS*  Irregular heart rate. Causes heart to abnormally slow down.  Family History Tami Wood; 02/01/2018 11:20 AM) Father  Father, Deceased. Passed in 06/10/2006 from cancer, MI at 69 yrs old, and quadruple bypass. Mother  Deceased. Mother had afib, passed away from kidney failure in 2015-06-11. Sister 1  Deceased. Passed away from MI in 06/11/11. Brother 1  In stable health. Had two MI's in 2005/06/10, had 4 stents placed in heart.  Social History Tami Wood [place]; 02/01/2018 11:20 AM) Current tobacco use  Never smoker. Alcohol Use  Occasional alcohol use. Marital status  Married. Living Situation  Lives with spouse. Number of Children  3.  Past Surgical History Tami Wood; 02/01/2018 11:25 AM) Knee Replacement, Total Jun 10, 2005: Right. Appendectomy June 10, 1986: Gallbladder Surgery [2014]: Gallbladder was removed. Hernia Repair [0454]: Catheterization, Right & Left Heart [2018]: Blocked artery 60-70% in main artery in heart.  Medication History Tami Maine, FNP-C; 02/02/2018 5:55 AM) Protonix (20MG Tablet DR, 1 Oral as needed) Active. Carafate (1GM Tablet, 1 Oral as needed) Active. Allopurinol (100MG Tablet, 1 Oral as needed) Active. predniSONE (5MG (48) Tab Ther Pack, 4 Oral as needed) Active. Medications Reconciled  Diagnostic Studies History Tami Maine, FNP-C; 02/01/2018 12:36 PM) Colonoscopy [2016]: Normal. CT Scan of Chest [2019]: Abnormal. 1. No acute pulmonary embolus. 2. Mild dilatation of the main pulmonary artery 3.9 cm compatible with a component of chronic pulmonary hypertension. 3.  Coronary arteriosclerosis. 4. No active pulmonary disease.    Review of Systems Tami Maine FNP-C; 02/02/2018 5:54 AM) General Not Present-  Appetite Loss and Weight Gain. Respiratory Not Present- Chronic Cough and Wakes up from Sleep Wheezing or Short of Breath. Cardiovascular Present- Chest Pain, Difficulty Breathing On Exertion and Edema (left upper extremity). Not Present- Claudications and Difficulty Breathing Lying Down. Gastrointestinal Not Present- Black, Tarry Stool and Difficulty Swallowing. Musculoskeletal Present- Muscle Pain (left arm pain). Not Present- Decreased Range of Motion and Muscle Atrophy. Neurological Present- Dizziness (occasional with standing). Not Present- Attention Deficit and Syncope. Psychiatric Not Present- Personality Changes and Suicidal Ideation. Endocrine Not Present- Cold Intolerance and Heat Intolerance. Hematology Not Present- Abnormal Bleeding. All other systems negative  Vitals Tami Wood [place]; 02/01/2018 11:39 AM) 02/01/2018 11:04 AM Weight: 301.06 lb Height: 75in Body Surface Area: 2.61 m Body Mass Index: 37.63 kg/m  Wood: 72 (Regular)  P.OX: 97% (Room air) BP: 123/78 (Sitting, Left Arm, Standard)       Physical Exam Tami Maine FNP-C; 02/02/2018 10:07 AM) General Mental Status-Alert. General Appearance-Cooperative and Appears stated age. Build & Nutrition-Moderately built and Moderately obese.  Head and Neck Thyroid Gland Characteristics - normal size and consistency and no palpable nodules.  Chest and Lung Exam Chest and lung exam reveals -quiet, even and easy respiratory effort with no use of accessory muscles, non-tender and on auscultation, normal breath sounds, no adventitious sounds. Inspection Chest Wall - Scar - Pacemaker pocket scar.  Cardiovascular Cardiovascular examination reveals -normal heart sounds, regular rate and rhythm with no murmurs, carotid auscultation reveals no bruits, abdominal aorta auscultation reveals no bruits and no prominent pulsation, femoral artery auscultation bilaterally reveals normal pulses, no bruits, no thrills  and normal pedal pulses bilaterally.  Abdomen Palpation/Percussion Palpation and Percussion of the abdomen reveal - Non Tender and No hepatosplenomegaly.  Peripheral Vascular Lower Extremity  Inspection: Note: larger than right due to swelling. Palpation - Radial Wood - Bilateral - Normal.  Neurologic Neurologic evaluation reveals -alert and oriented x 3 with no impairment of recent or remote memory. Motor-Grossly intact without any focal deficits.  Musculoskeletal Global Assessment Left Lower Extremity - no deformities, masses or tenderness, no known fractures. Right Lower Extremity - no deformities, masses or tenderness, no known fractures.    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 Normal left ventricular wall motion. LVEF 60%. Non invasive risk stratification: Low Current Plans Started Metoprolol Tartrate 25MG, 1 (one) Tablet twice a day, #60, 30 days starting 02/01/2018, Ref. x1. METABOLIC PANEL, COMPREHENSIVE (88110) CBC & PLATELETS (AUTO) (31594) Dyspnea on exertion (R06.09) Story: EKG 02/01/2018: Normal sinus rhythm at 60 bpm, normal axis, no evidence of ischemia. Normal EKG. 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 Some likely A noise intervals on some egms, markers  only. Hyperlipidemia, mixed (E78.2) Current Plans Started Rosuvastatin Calcium 10MG, 1 (one) Tablet daily, #30, 30 days starting 02/01/2018, Ref. x1. LIPID PANEL (30865) Laboratory examination (Z01.89) Story: Labs 11/15/2017: Serum glucose 13 mg, BUN 20, creatinine 1.33, eGFR 59 mL. AST 33, ALT mildly elevated at 59. CMP otherwise normal.  Labs 06/23/2016: Total cholesterol 239, triglycerides 112, HDL 37, LDL 180.  Note:. Recommendation:  Patient with hyperlipidemia, GERD, rheumatoid arthritis, aortic dilation, neurocardiogenic syncope s/p PPM placement in 1990, CAD by coronary angiogram in October 2018 at [facility] with moderate LAD stenosis 50-60% and mild nonobstructive disease in left circumflex and RCA, medical management was recommended. He presents as a self-referral for evaluation and management of CAD.  Patient has many complex conditions and comorbidities. I suspect that his chest pain is musculoskeletal etiology; however, it is concerning that he has dyspnea on exertion with minimal effort and in view of his known moderate CAD and without medical treatment, I am concerned regarding progression of disease. As he is recently had a nuclear stress test that was low risk, but continues to have significant symptoms, I feel that he would best benefit from coronary angiogram. I'll also perform right heart catheter for evaluation of any pulmonary hypertension that could be contributing to his dyspnea on exertion especially in view of his rheumatoid arthritis. Schedule for cardiac catheterization, and possible angioplasty. We discussed regarding risks, benefits, alternatives to this including stress testing, CTA and continued medical therapy. Patient wants to proceed. Understands <1-2% risk of death, stroke, MI, urgent CABG, bleeding, infection, renal failure  but not limited to these.  I've encouraged him to start daily 81 mg aspirin. He has not had recent lipids, but lipids one year ago was markedly elevated. He will have lipids along with CBC and CMP performed tomorrow to establish a baseline. He will start Crestor 10 mg daily after having blood work performed.  Have reviewed recent pacemaker interrogation, will take over managing his pacemaker. Blood pressure is stable, but depending upon findings will likely need at least beta blocker therapy. He will need risk factor modification with weight loss. Will further discuss at his next office visit.  I have reviewed test results from [facility] and [facility] regarding left upper extremity swelling. This appears to be a chronic issue and not acute. Has venous claudication symtoms. No DVT was noted. 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 I have extensively discussed this with the patient and his wife. Encouraged him to use left arm sleeve to help with his swelling. May need further evaluation at some point. I will see him back after his coronary angiogram for follow-up. This was a greater than 60 minute office visit with greater than 50% of the time spent with face-to-face encounter with patient and evaluation of complex medical issues, review of external records and coordination of care.  *I have discussed this case with Dr. Einar Gip and he personally examined the patient and participated in formulating the plan.*  CC: Tami Anchors, MD (PCP); CC: Dr. Posey Pronto ([place]he[place]) Addendum Note(Jagadeesh REinar Gip MD; 02/03/2018 7:32 AM) Labs 02/02/2018: Serum glucose 95 mg, BUN 15, creatinine 1.08, eGFR greater than 60 him up, CMP normal. HB 14.9/HCT 42.2, platelets 142. Normal indicis. Total cholesterol 229, triglycerides 125, HDL 37, LDL 167.  Labs 11/15/2017: Serum glucose 13 mg, BUN 20, creatinine 1.33, eGFR 59 mL. AST 33, ALT mildly elevated at 59. CMP otherwise  normal.  Labs 06/23/2016: Total cholesterol 239, triglycerides 112, HDL 37,  LDL 180.     Signed by Tami Maine, FNP-C (02/02/2018 10:10 AM)

## 2018-02-04 NOTE — Progress Notes (Signed)
Labs 01/30/2018: Glucose 84. BUN/Cr 9/0.7. eGFR normal. Na/K 137/3.9 H/H 12/26. MCV 88. Platelets 192.

## 2018-02-06 ENCOUNTER — Encounter (HOSPITAL_COMMUNITY)
Admission: RE | Disposition: A | Discharge status: Home / Self Care | Payer: Self-pay | Source: Ambulatory Visit | Attending: Cardiology

## 2018-02-06 ENCOUNTER — Ambulatory Visit (HOSPITAL_COMMUNITY): Payer: Medicare Other

## 2018-02-06 ENCOUNTER — Encounter (HOSPITAL_COMMUNITY): Payer: Self-pay | Admitting: *Deleted

## 2018-02-06 ENCOUNTER — Ambulatory Visit (HOSPITAL_COMMUNITY)
Admission: RE | Admit: 2018-02-06 | Discharge: 2018-02-06 | Disposition: A | Discharge status: Home / Self Care | Payer: Medicare Other | Source: Ambulatory Visit | Attending: Cardiology | Admitting: Cardiology

## 2018-02-06 ENCOUNTER — Other Ambulatory Visit: Payer: Self-pay

## 2018-02-06 DIAGNOSIS — I10 Essential (primary) hypertension: Secondary | ICD-10-CM | POA: Diagnosis not present

## 2018-02-06 DIAGNOSIS — I340 Nonrheumatic mitral (valve) insufficiency: Secondary | ICD-10-CM | POA: Diagnosis present

## 2018-02-06 DIAGNOSIS — I341 Nonrheumatic mitral (valve) prolapse: Secondary | ICD-10-CM | POA: Diagnosis not present

## 2018-02-06 DIAGNOSIS — M1990 Unspecified osteoarthritis, unspecified site: Secondary | ICD-10-CM | POA: Diagnosis not present

## 2018-02-06 DIAGNOSIS — I2510 Atherosclerotic heart disease of native coronary artery without angina pectoris: Secondary | ICD-10-CM | POA: Insufficient documentation

## 2018-02-06 HISTORY — PX: RIGHT/LEFT HEART CATH AND CORONARY ANGIOGRAPHY: CATH118266

## 2018-02-06 HISTORY — PX: TEE WITHOUT CARDIOVERSION: SHX5443

## 2018-02-06 LAB — POCT I-STAT 3, ART BLOOD GAS (G3+)
ACID-BASE EXCESS: 1 mmol/L (ref 0.0–2.0)
Bicarbonate: 26.9 mmol/L (ref 20.0–28.0)
O2 SAT: 98 %
TCO2: 28 mmol/L (ref 22–32)
pCO2 arterial: 48.4 mmHg — ABNORMAL HIGH (ref 32.0–48.0)
pH, Arterial: 7.353 (ref 7.350–7.450)
pO2, Arterial: 121 mmHg — ABNORMAL HIGH (ref 83.0–108.0)

## 2018-02-06 LAB — POCT I-STAT 3, VENOUS BLOOD GAS (G3P V)
Acid-Base Excess: 2 mmol/L (ref 0.0–2.0)
BICARBONATE: 28.5 mmol/L — AB (ref 20.0–28.0)
O2 SAT: 78 %
TCO2: 30 mmol/L (ref 22–32)
pCO2, Ven: 53.1 mmHg (ref 44.0–60.0)
pH, Ven: 7.338 (ref 7.250–7.430)
pO2, Ven: 46 mmHg — ABNORMAL HIGH (ref 32.0–45.0)

## 2018-02-06 SURGERY — ECHOCARDIOGRAM, TRANSESOPHAGEAL
Anesthesia: Moderate Sedation

## 2018-02-06 SURGERY — RIGHT/LEFT HEART CATH AND CORONARY ANGIOGRAPHY
Anesthesia: LOCAL

## 2018-02-06 MED ORDER — SODIUM CHLORIDE 0.9 % IV SOLN: INTRAVENOUS | Status: DC

## 2018-02-06 MED ORDER — ASPIRIN 81 MG PO CHEW: 81.0000 mg | CHEWABLE_TABLET | ORAL | Status: DC

## 2018-02-06 MED ORDER — BUTAMBEN-TETRACAINE-BENZOCAINE 2-2-14 % EX AERO
INHALATION_SPRAY | CUTANEOUS | Status: DC | PRN
  Administered 2018-02-06: 2 via TOPICAL

## 2018-02-06 MED ORDER — SODIUM CHLORIDE 0.9% FLUSH: 3.0000 mL | Freq: Two times a day (BID) | INTRAVENOUS | Status: DC

## 2018-02-06 MED ORDER — ACETAMINOPHEN 325 MG PO TABS: 650.0000 mg | ORAL_TABLET | ORAL | Status: DC | PRN

## 2018-02-06 MED ORDER — HEPARIN (PORCINE) IN NACL 1000-0.9 UT/500ML-% IV SOLN
INTRAVENOUS | Status: AC
  Filled 2018-02-06: qty 1000

## 2018-02-06 MED ORDER — HEPARIN SODIUM (PORCINE) 1000 UNIT/ML IJ SOLN
INTRAMUSCULAR | Status: DC | PRN
  Administered 2018-02-06: 4000 [IU] via INTRAVENOUS

## 2018-02-06 MED ORDER — LIDOCAINE HCL (PF) 1 % IJ SOLN
INTRAMUSCULAR | Status: AC
  Filled 2018-02-06: qty 30

## 2018-02-06 MED ORDER — SODIUM CHLORIDE 0.9% FLUSH: 3.0000 mL | INTRAVENOUS | Status: DC | PRN

## 2018-02-06 MED ORDER — ONDANSETRON HCL 4 MG/2ML IJ SOLN: 4.0000 mg | Freq: Four times a day (QID) | INTRAMUSCULAR | Status: DC | PRN

## 2018-02-06 MED ORDER — FENTANYL CITRATE (PF) 100 MCG/2ML IJ SOLN
INTRAMUSCULAR | Status: AC
  Filled 2018-02-06: qty 2

## 2018-02-06 MED ORDER — FENTANYL CITRATE (PF) 100 MCG/2ML IJ SOLN
INTRAMUSCULAR | Status: DC | PRN
  Administered 2018-02-06: 50 ug via INTRAVENOUS

## 2018-02-06 MED ORDER — SODIUM CHLORIDE 0.9 % IV SOLN: INTRAVENOUS | Status: AC

## 2018-02-06 MED ORDER — LIDOCAINE HCL (PF) 1 % IJ SOLN
INTRAMUSCULAR | Status: DC | PRN
  Administered 2018-02-06 (×2): 1 mL

## 2018-02-06 MED ORDER — FENTANYL CITRATE (PF) 100 MCG/2ML IJ SOLN
INTRAMUSCULAR | Status: DC | PRN
  Administered 2018-02-06 (×2): 50 ug via INTRAVENOUS

## 2018-02-06 MED ORDER — MIDAZOLAM HCL 2 MG/2ML IJ SOLN
INTRAMUSCULAR | Status: AC
  Filled 2018-02-06: qty 2

## 2018-02-06 MED ORDER — SODIUM CHLORIDE 0.9 % IV SOLN: 250.0000 mL | INTRAVENOUS | Status: DC | PRN

## 2018-02-06 MED ORDER — HEPARIN (PORCINE) IN NACL 1000-0.9 UT/500ML-% IV SOLN
INTRAVENOUS | Status: DC | PRN
  Administered 2018-02-06 (×2): 500 mL

## 2018-02-06 MED ORDER — MIDAZOLAM HCL (PF) 5 MG/ML IJ SOLN
INTRAMUSCULAR | Status: AC
  Filled 2018-02-06: qty 2

## 2018-02-06 MED ORDER — IOHEXOL 350 MG/ML SOLN
INTRAVENOUS | Status: DC | PRN
  Administered 2018-02-06: 40 mL via INTRA_ARTERIAL

## 2018-02-06 MED ORDER — SODIUM CHLORIDE 0.9 % IV SOLN
INTRAVENOUS | Status: AC | PRN
  Administered 2018-02-06: 75 mL/h via INTRAVENOUS

## 2018-02-06 MED ORDER — MIDAZOLAM HCL 2 MG/2ML IJ SOLN
INTRAMUSCULAR | Status: DC | PRN
  Administered 2018-02-06: 1 mg via INTRAVENOUS

## 2018-02-06 MED ORDER — MIDAZOLAM HCL (PF) 5 MG/ML IJ SOLN
INTRAMUSCULAR | Status: DC | PRN
  Administered 2018-02-06 (×2): 1 mg via INTRAVENOUS

## 2018-02-06 MED ORDER — VERAPAMIL HCL 2.5 MG/ML IV SOLN
INTRAVENOUS | Status: DC | PRN
  Administered 2018-02-06: 10 mL via INTRA_ARTERIAL

## 2018-02-06 SURGICAL SUPPLY — 13 items
CATH BALLN WEDGE 5F 110CM (CATHETERS) ×1 IMPLANT
CATH INFINITI 5 FR JL3.5 (CATHETERS) ×1 IMPLANT
CATH INFINITI 5FR JK (CATHETERS) ×1 IMPLANT
DEVICE RAD COMP TR BAND LRG (VASCULAR PRODUCTS) ×1 IMPLANT
GLIDESHEATH SLEND A-KIT 6F 22G (SHEATH) ×1 IMPLANT
GUIDEWIRE .025 260CM (WIRE) ×1 IMPLANT
GUIDEWIRE INQWIRE 1.5J.035X260 (WIRE) IMPLANT
INQWIRE 1.5J .035X260CM (WIRE) ×2
KIT HEART LEFT (KITS) ×2 IMPLANT
PACK CARDIAC CATHETERIZATION (CUSTOM PROCEDURE TRAY) ×2 IMPLANT
SHEATH GLIDE SLENDER 4/5FR (SHEATH) ×1 IMPLANT
TRANSDUCER W/STOPCOCK (MISCELLANEOUS) ×2 IMPLANT
TUBING CIL FLEX 10 FLL-RA (TUBING) ×2 IMPLANT

## 2018-02-06 NOTE — Discharge Instructions (Signed)

## 2018-02-06 NOTE — Progress Notes (Signed)
  Echocardiogram 2D Echocardiogram has been performed.  Tami Wood 02/06/2018, 8:58 AM

## 2018-02-06 NOTE — Interval H&P Note (Signed)
History and Physical Interval Note:  02/06/2018 8:06 AM  Tami Wood  has presented today for surgery, with the diagnosis of MITRAL REGURGITATION  The various methods of treatment have been discussed with the patient and family. After consideration of risks, benefits and other options for treatment, the patient has consented to  Procedure(s): TRANSESOPHAGEAL ECHOCARDIOGRAM (TEE) (N/A) as a surgical intervention .  The patient's history has been reviewed, patient examined, no change in status, stable for surgery.  I have reviewed the patient's chart and labs.  Questions were answered to the patient's satisfaction.     [place]

## 2018-02-06 NOTE — Interval H&P Note (Signed)
History and Physical Interval Note:  02/06/2018 10:35 AM  Tami Wood  has presented today for surgery, with the diagnosis of mitrial regurgetation  The various methods of treatment have been discussed with the patient and family. After consideration of risks, benefits and other options for treatment, the patient has consented to  Procedure(s): RIGHT/LEFT HEART CATH AND CORONARY ANGIOGRAPHY (N/A) as a surgical intervention .  The patient's history has been reviewed, patient examined, no change in status, stable for surgery.  I have reviewed the patient's chart and labs.  Questions were answered to the patient's satisfaction.    2012 Appropriate Use Criteria for Diagnostic Catheterization Home / Select Test of Interest Indication for RHC Valvular Disease Valvular Disease (Right and Left Heart Catheterization or Right Heart Catheterization Alone With or  Valvular Disease  (Right and Left Heart Catheterization or Right Heart Catheterization  Alone With or Without Left Ventriculography and Coronary Angiography) Link Here: [email or web address] Indication:  1. Preoperative assessment before valvular surgery A (7) Indication: 70; Score 7     Tami Wood

## 2018-02-06 NOTE — CV Procedure (Signed)
TEE: Under moderate sedation, TEE was performed without complications: LV: Normal. Normal EF. RV: Normal LA: Dilatedl. Left atrial appendage: Normal without thrombus. Normal function. Inter atrial septum is intact without defect.  RA: Normal  MV: P2 prolapse with severe anteriorly directed mitral regurgitation TV: Normal. No TR AV: Normal. No AI or AS. PV: Normal. No PI.  Thoracic and ascending aorta: Normal without significant plaque or atheromatous changes.  Conscious sedation protocol was followed, I personally administered conscious sedation and monitored the patient. Patient received 2 milligrams of Versed and 100 . Patient tolerated the procedure well and there was no complication from conscious sedation. Time administered was 30 min and procedure ended at 8:40 AM.  Nigel Mormon, MD [facility] Pager: [phone] Office: [phone] If no answer Cell [phone]

## 2018-02-07 ENCOUNTER — Encounter (HOSPITAL_COMMUNITY): Payer: Self-pay | Admitting: Cardiology

## 2018-02-08 DIAGNOSIS — I2510 Atherosclerotic heart disease of native coronary artery without angina pectoris: Secondary | ICD-10-CM | POA: Diagnosis not present

## 2018-02-08 DIAGNOSIS — I340 Nonrheumatic mitral (valve) insufficiency: Secondary | ICD-10-CM | POA: Diagnosis not present

## 2018-03-01 ENCOUNTER — Encounter: Payer: Self-pay | Admitting: *Deleted

## 2018-03-05 ENCOUNTER — Other Ambulatory Visit: Payer: Self-pay | Admitting: *Deleted

## 2018-03-05 ENCOUNTER — Encounter: Payer: Self-pay | Admitting: *Deleted

## 2018-03-05 ENCOUNTER — Encounter: Payer: Self-pay | Admitting: Thoracic Surgery (Cardiothoracic Vascular Surgery)

## 2018-03-05 ENCOUNTER — Institutional Professional Consult (permissible substitution) (INDEPENDENT_AMBULATORY_CARE_PROVIDER_SITE_OTHER): Payer: Medicare Other | Admitting: Thoracic Surgery (Cardiothoracic Vascular Surgery)

## 2018-03-05 ENCOUNTER — Other Ambulatory Visit: Payer: Self-pay

## 2018-03-05 VITALS — BP 138/81 | HR 73 | Resp 16 | Ht 65.0 in | Wt 170.0 lb

## 2018-03-05 DIAGNOSIS — I2510 Atherosclerotic heart disease of native coronary artery without angina pectoris: Secondary | ICD-10-CM | POA: Diagnosis not present

## 2018-03-05 DIAGNOSIS — I340 Nonrheumatic mitral (valve) insufficiency: Secondary | ICD-10-CM

## 2018-03-05 NOTE — Patient Instructions (Signed)
Continue all previous medications without any changes at this time  

## 2018-03-05 NOTE — Progress Notes (Signed)
[address]       [place] [address]             [phone]     CARDIOTHORACIC SURGERY CONSULTATION REPORT  Referring Provider is Patwardhan, Reynold Bowen, MD PCP is Biagio Borg, MD  Chief Complaint  Patient presents with  . Mitral Regurgitation    TEE 02/06/18, DENTAL PROCEDURE 12/25/17  . Coronary Artery Disease    CATH 02/06/18    HPI:  Patient is a 67 year old African-American female with long-standing history of heart murmur and essential hypertension who has been referred for surgical consultation to discuss treatment options for management of mitral valve prolapse with severe symptomatic primary mitral regurgitation and multivessel coronary artery disease.  Patient states that she has been told that she had a heart murmur all of her life.  However, she has never been formally evaluated by a cardiologist until recently.  She recently had some dental work done and her dentist insisted that she see a cardiologist to make certain that she was being treated appropriately for an endocarditis prophylaxis.  She has recently developed symptoms of exertional shortness of breath, although they occur only with more strenuous physical exertion such as going up and down a flight of stairs.  She was evaluated by Dr. Earnie Larsson, and routine transthoracic echocardiogram revealed normal left ventricular size and systolic function with severe prolapse involving the posterior leaflet of the mitral valve and severe mitral regurgitation.  Pulmonary artery pressures were mildly elevated.  Ejection fraction was estimated 55%.  The patient subsequently underwent transesophageal echocardiogram and diagnostic cardiac catheterization on February 06, 2018.  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  There was mild left atrial enlargement.  Ejection  fraction was estimated 55 to 60%.  Left and right heart catheterization revealed multivessel coronary artery disease including 80% stenosis of the proximal left anterior descending coronary artery and 70% stenosis of the distal right coronary artery.  Pulmonary artery pressures were minimally elevated.  There were large V waves on wedge tracing consistent with severe mitral regurgitation.  Cardiothoracic surgical consultation was requested.  Patient is single and lives alone locally in [place].  She has 3 sisters who she is very close with, all of whom accompany her for her office consultation visit today.  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  She drives an automobile and remains entirely functionally independent.  She does not exercise on a regular basis.  She states that recently she has developed shortness of breath with exertion, such as walking up a flight of stairs.  She complains that she gets tired more easily than she used to.  She denies any history of chest pain or chest tightness either with activity or at rest.  She denies any history of resting shortness of breath, PND, orthopnea, or lower extremity edema.  She has not had tachypalpitations, dizzy spells, nor syncope.  Past Medical History:  Diagnosis Date  . ALLERGIC RHINITIS 06/18/2009  . ANEMIA-IRON DEFICIENCY 11/28/2006  . ANXIETY 11/28/2006  . Coronary artery disease   . Cough 06/18/2009  . DEPRESSION 11/28/2006  . GERD 12/10/2007  . HYPERTENSION 11/28/2006  . MURMUR 11/28/2006  . PEPTIC ULCER DISEASE 12/10/2007    Past Surgical History:  Procedure Laterality  Date  . ABDOMINAL HYSTERECTOMY    . CHOLECYSTECTOMY    . RIGHT/LEFT HEART CATH AND CORONARY ANGIOGRAPHY N/A 02/06/2018   Procedure: RIGHT/LEFT HEART CATH AND CORONARY ANGIOGRAPHY;  Surgeon: Nigel Mormon, MD;  Location: [facility];  Service:  Cardiovascular;  Laterality: N/A;  . s/p multiple breast biopsy     negative  . TEE WITHOUT CARDIOVERSION N/A 02/06/2018   Procedure: TRANSESOPHAGEAL ECHOCARDIOGRAM (TEE);  Surgeon: Nigel Mormon, MD;  Location: [facility];  Service: Cardiovascular;  Laterality: N/A;    Family History  Problem Relation Age of Onset  . Transient ischemic attack Other   . Hypertension Other     Social History   Socioeconomic History  . Marital status: Single    Spouse name: Not on file  . Number of children: Not on file  . Years of education: Not on file  . Highest education level: Not on file  Occupational History  . Not on file  Social Needs  . Financial resource strain: Not on file  . Food insecurity:    Worry: Not on file    Inability: Not on file  . Transportation needs:    Medical: Not on file    Non-medical: Not on file  Tobacco Use  . Smoking status: Never Smoker  . Smokeless tobacco: Never Used  Substance and Sexual Activity  . Alcohol use: No  . Drug use: Never  . Sexual activity: Not on file  Lifestyle  . Physical activity:    Days per week: Not on file    Minutes per session: Not on file  . Stress: Not on file  Relationships  . Social connections:    Talks on phone: Not on file    Gets together: Not on file    Attends religious service: Not on file    Active member of club or organization: Not on file    Attends meetings of clubs or organizations: Not on file    Relationship status: Not on file  . Intimate partner violence:    Fear of current or ex partner: Not on file    Emotionally abused: Not on file    Physically abused: Not on file    Forced sexual activity: Not on file  Other Topics Concern  . Not on file  Social History Narrative  . Not on file    Current Outpatient Medications  Medication Sig Dispense Refill  . amoxicillin (AMOXIL) 500 MG capsule Take 2 capsules (1,000 mg total) by mouth 2 (two) times daily. (Patient taking differently: Take  2,000 mg by mouth See admin instructions. For Dental Procedures) 40 capsule 0  . aspirin 81 MG EC tablet Take 81 mg by mouth daily.      . lansoprazole (PREVACID) 15 MG capsule Take 15 mg by mouth daily as needed.     Marland Kitchen losartan-hydrochlorothiazide (HYZAAR) 50-12.5 MG tablet TAKE 1/2 TABLET BY MOUTH ONCE DAILY (Patient taking differently: Take 0.5 tablets by mouth daily. TAKE 1/2 TABLET BY MOUTH ONCE DAILY) 90 tablet 1  . Multiple Vitamins-Minerals (MULTIVITAMIN WITH MINERALS) tablet Take 1 tablet by mouth daily.    . naproxen sodium (ALEVE) 220 MG tablet Take 220 mg by mouth daily as needed (pain).    . TURMERIC PO Take 1,000 mg by mouth daily as needed (pain).     No current facility-administered medications for this visit.     No Known Allergies    Review of Systems:   General:  normal appetite, decreased  energy, no weight gain, no weight loss, no fever  Cardiac:  no chest pain with exertion, no chest pain at rest, +SOB with exertion, no resting SOB, no PND, no orthopnea, no palpitations, no arrhythmia, no atrial fibrillation, no LE edema, no dizzy spells, no syncope  Respiratory:  no shortness of breath, no home oxygen, no productive cough, no dry cough, no bronchitis, no wheezing, no hemoptysis, no asthma, no pain with inspiration or cough, no sleep apnea, no CPAP at night  GI:   no difficulty swallowing, + reflux, no frequent heartburn, no hiatal hernia, no abdominal pain, no constipation, no diarrhea, no hematochezia, no hematemesis, no melena  GU:   no dysuria,  no frequency, no urinary tract infection, no hematuria, no kidney stones, no kidney disease  Vascular:  no pain suggestive of claudication, no pain in feet, no leg cramps, no varicose veins, no DVT, no non-healing foot ulcer  Neuro:   no stroke, no TIA's, no seizures, no headaches, no temporary blindness one eye,  no slurred speech, no peripheral neuropathy, no chronic pain, no instability of gait, very mild memory/cognitive  dysfunction  Musculoskeletal: + arthritis primarily in right knee, no joint swelling, no myalgias, no difficulty walking, normal mobility   Skin:   no rash, no itching, no skin infections, no pressure sores or ulcerations  Psych:   no anxiety, no depression, + nervousness, + unusual recent stress  Eyes:   + blurry vision, no floaters, + recent vision changes, + wears glasses or contacts  ENT:   no hearing loss, no loose or painful teeth, partial dentures, last saw dentist 01/05/2018  Hematologic:  no easy bruising, no abnormal bleeding, no clotting disorder, no frequent epistaxis  Endocrine:  no diabetes, does not check CBG's at home     Physical Exam:   BP 138/81 (BP Location: Right Arm, Patient Position: Sitting, Cuff Size: Large)   Pulse 73   Resp 16   Ht 5\' 5"  (1.651 m)   Wt 170 lb (77.1 kg)   SpO2 98% Comment: ON RA  BMI 28.29 kg/m   General:    well-appearing  HEENT:  Unremarkable   Neck:   no JVD, no bruits, no adenopathy   Chest:   clear to auscultation, symmetrical breath sounds, no wheezes, no rhonchi   CV:   RRR, grade IV/VI holosystolic murmur best at apex  Abdomen:  soft, non-tender, no masses   Extremities:  warm, well-perfused, pulses diminished but palpable, no LE edema  Rectal/GU  Deferred  Neuro:   Grossly non-focal and symmetrical throughout  Skin:   Clean and dry, no rashes, no breakdown   Diagnostic Tests:  Transesophageal Echocardiography  Patient:    Tami Wood, Tami Wood MR #:       [id number] Study Date: 02/06/2018 Gender:     F Age:        68 Height:     165.1 cm Weight:     77.1 kg BSA:        1.9 m^2 Pt. Status: Room:   ADMITTING    Vernell Leep, MD  [place], MD  ORDERING     Vernell Leep, MD  PERFORMING   Vernell Leep, MD  Heflin, MD  SONOGRAPHER  Madelaine Etienne  cc:  ------------------------------------------------------------------- LV EF: 55% -    60%  ------------------------------------------------------------------- Indications:      Mitral regurgitation 424.0.  ------------------------------------------------------------------- Study Conclusions  - Left ventricle: The cavity size was  normal. Wall thickness was   normal. The estimated ejection fraction was in the range of 55%   to 60%. Wall motion was normal; there were no regional wall   motion abnormalities. - Mitral valve: Myxomatous degeneration with P2 scallop prolapse.   Severe primary mitral regurgitation, jet is eccentric and   anteriorly directed. - Left atrium: The atrium was mildly dilated. - Pulmonary veins: Systolic reversal in left upper and lower   pulmonary vein. (Seen in latter images)  ------------------------------------------------------------------- Study data:   Study status:  Routine.  Consent:  The risks, benefits, and alternatives to the procedure were explained to the patient and informed consent was obtained.  Procedure:  The patient reported no pain pre or post test. Initial setup. The patient was brought to the laboratory. Surface ECG leads were monitored. Sedation. Conscious sedation was administered by cardiology staff. Transesophageal echocardiography. Topical anesthesia was obtained using viscous lidocaine. An adult multiplane transesophageal probe was inserted by the attending cardiologistwithout difficulty. Image quality was good.  Study completion:  The patient tolerated the procedure well. There were no complications.  Administered medications:   Fentanyl.          Diagnostic transesophageal echocardiography.  2D and color Doppler.  Birthdate:  Patient birthdate: [birth year].  Age:  Patient is 68 yr old.  Sex:  Gender: female.    BMI: 28.3 kg/m^2.  Blood pressure:     181/104  Patient status:  Inpatient.  Study date:  Study date: 02/06/2018. Study time: 07:53 AM.  Location:   Endoscopy.  -------------------------------------------------------------------  ------------------------------------------------------------------- Left ventricle:  The cavity size was normal. Wall thickness was normal. The estimated ejection fraction was in the range of 55% to 60%. Wall motion was normal; there were no regional wall motion abnormalities.  ------------------------------------------------------------------- Aortic valve:   Structurally normal valve.    Doppler:  There was no regurgitation.  ------------------------------------------------------------------- Mitral valve:  Myxomatous degeneration with P2 scallop prolapse. Severe primary mitral regurgitation, jet is eccentric and anteriorly directed.  ------------------------------------------------------------------- Left atrium:  The atrium was mildly dilated. The appendage was morphologically a left appendage. Emptying velocity was normal.   ------------------------------------------------------------------- Pulmonary veins:  Systolic reversal in left upper and lower pulmonary vein. (Seen in latter images)  ------------------------------------------------------------------- Right ventricle:  The cavity size was normal. Systolic function was normal.  ------------------------------------------------------------------- Pulmonic valve:    Structurally normal valve.    Doppler:  There was no regurgitation.  ------------------------------------------------------------------- Tricuspid valve:   Structurally normal valve.    Doppler:  There was no regurgitation.  ------------------------------------------------------------------- Right atrium:  The atrium was normal in size.  ------------------------------------------------------------------- Pericardium:  The pericardium was normal in appearance.  ------------------------------------------------------------------- Measurements   Aorta                                     Value  Ascending aorta ID, A-P, S               32    mm    Mitral valve                             Value  Mitral maximal regurg velocity, PISA     503   cm/s  Mitral regurg VTI, PISA                  141   cm  Mitral ERO, PISA  0.12  cm^2  Mitral regurg volume, PISA               17    ml  Legend: (L)  and  (H)  mark values outside specified reference range.  ------------------------------------------------------------------- Prepared and Electronically Authenticated by  Vernell Leep, MD 2019-11-12T09:44:22   RIGHT/LEFT HEART CATH AND CORONARY ANGIOGRAPHY  Conclusion   LM: Normal LAD: Prox LAD/Diag bifurcation stenosis          Prox LAD 70%, D1 ostial 60% stenosis          Distal apical LAD 40% stenosis LCx: Normal RCA: Prox-mid RCA 30% stenosis          Distal RCA 70% stenosis  Normal LVEDP, PCWP  Recommendation: CVTS consultation for possible MV repair and CABG  Nigel Mormon, MD [facility] Pager: [phone] Office: [phone] If no answer Cell [phone]    Indications   Severe mitral regurgitation [I34.0 (ICD-10-CM)]  Procedural Details   Technical Details Procedures: 1. Right heart catheterization 2. Left heart catheterization 3. Selective left and right coronary angiography 4. Conscious sedation monitoring 32 min  Indication: Severe mitral regurgitation  History: 67 y/o [place] American female with posterior leaflet prolapse, severe anteriorly directed mitral regurgitation, abnormal exercise treadmill stress test.  Catheter/s advanced over guidewire under fluoroscopy 4 Fr balloon tipped wedge catheter 5 Fr JL 3.5 5 Fr TIG 4.0  Wires used: Inquire 0.025 " wire   Pressures tracings obtained in right atrium, right ventricle, pulmonary artery, and pulmonary capillary wedge position.   Anticoagulation:  4000 units heparin  Total contrast used: 40 cc    Total fluoro time: 5.6 min Air Kerma: 365 mGy  All wires and catheters removed out of the body at the end of the procedure Final angiogram showed no dissection/perforation         Estimated blood loss <50 mL.  During this procedure the patient was administered the following to achieve and maintain moderate conscious sedation: Versed 1 mg, Morphine 50 mg, while the patient's heart rate, blood pressure, and oxygen saturation were continuously monitored. The period of conscious sedation was 32 minutes, of which I was present face-to-face 100% of this time.  Medications  (Filter: Administrations occurring from 02/06/18 1026 to 02/06/18 1126)  Medication Rate/Dose/Volume Action  Date Time   fentaNYL (SUBLIMAZE) injection (mcg) 50 mcg Given 02/06/18 1046   Total dose as of 03/05/18 1044        50 mcg        midazolam (VERSED) injection (mg) 1 mg Given 02/06/18 1046   Total dose as of 03/05/18 1044        1 mg        lidocaine (PF) (XYLOCAINE) 1 % injection (mL) 1 mL Given 02/06/18 1052   Total dose as of 03/05/18 1044 1 mL Given 1053   2 mL        0.9 % sodium chloride infusion (mL/hr) 75 mL/hr New Bag/Given 02/06/18 1054   Dosing weight:  77.1 kg        Total dose as of 03/05/18 1044        Cannot be calculated        Radial Cocktail/Verapamil only (mL) 10 mL Given 02/06/18 1056   Total dose as of 03/05/18 1044        10 mL        heparin injection (Units) 4,000 Units Given 02/06/18 1110   Total dose as of 03/05/18 1044  4,000 Units        Heparin (Porcine) in NaCl 1000-0.9 UT/500ML-% SOLN (mL) 500 mL Given 02/06/18 1118   Total dose as of 03/05/18 1044 500 mL Given 1118   1,000 mL        iohexol (OMNIPAQUE) 350 MG/ML injection (mL) 40 mL Given 02/06/18 1118   Total dose as of 03/05/18 1044        40 mL        Sedation Time   Sedation Time Physician-1: 31 minutes 24 seconds  Complications   Complications documented before study signed (02/06/2018 5:54 PM EST)     RIGHT/LEFT HEART CATH AND CORONARY ANGIOGRAPHY   None Documented by Nigel Mormon, MD 02/06/2018 11:27 AM EST  Time Range: Intraprocedure      Coronary Findings   Diagnostic  Dominance: Right  Left Anterior Descending  Prox LAD lesion 80% stenosed  Prox LAD lesion is 80% stenosed.  Dist LAD lesion 40% stenosed  Dist LAD lesion is 40% stenosed.  First Diagonal Branch  Ost 1st Diag lesion 60% stenosed  Ost 1st Diag lesion is 60% stenosed.  Right Coronary Artery  Prox RCA to Mid RCA lesion 30% stenosed  Prox RCA to Mid RCA lesion is 30% stenosed.  Dist RCA lesion 70% stenosed  Dist RCA lesion is 70% stenosed.  Intervention   No interventions have been documented.  Right Heart   Right Heart Pressures RA: 2 mmHg RV: 32/1 mmhg, RVEDP 5 mmHg PA: 31/8 mmHg, mean PA 19 mmHg PCWP: 10-13 mmHg with tall v waves in left sided pulmonary vein  LVEDP 13 mmHg  CO: 7.5 L/min CI: 4 L/min/m2  Coronary Diagrams   Diagnostic  Dominance: Right    Intervention   Implants    No implant documentation for this case.  MERGE Images   Show images for CARDIAC CATHETERIZATION   Link to Procedure Log   Procedure Log    Hemo Data    Most Recent Value  Fick Cardiac Output 7.52 L/min  Fick Cardiac Output Index 4.07 (L/min)/BSA  QP/QS 1      Impression:  Patient has mitral valve prolapse with stage D severe symptomatic primary mitral regurgitation and multivessel coronary artery disease without angina pectoris.  She describes recent onset and progression of symptoms of exertional shortness of breath and fatigue consistent with chronic diastolic congestive heart failure, New York Heart Association functional class II.  I have personally reviewed the patient's recent transesophageal echocardiogram and diagnostic cardiac catheterization.   TEE reveals normal left ventricular systolic function with myxomatous degeneration of the mitral valve and a large segment involving the  middle scallop (P2) of the posterior leaflet causing severe mitral regurgitation.  There is left atrial enlargement.  There are multiple elongated chordae tendinae.  Diagnostic cardiac catheterization revealed severe multivessel coronary artery disease including high-grade 80% stenosis of the proximal left anterior descending coronary artery arising at takeoff of a medium-sized diagonal branch.  There is 70% stenosis of the distal right coronary artery and right dominant coronary circulation.  Right heart pressures were normal although there were large V waves on wedge tracing consistent with severe mitral regurgitation.  I feel the patient would best be treated with mitral valve repair and coronary artery bypass grafting.   Plan:  The patient and her 3 sisters were counseled at length regarding the indications, risks and potential benefits of mitral valve repair and coronary artery bypass grafting.  The rationale for elective surgery has been explained, including  a comparison between surgery and continued medical therapy with close follow-up.  The likelihood of successful and durable valve repair has been discussed with particular reference to the findings of their recent echocardiogram.  Based upon these findings and previous experience, I have quoted them a greater than 95 percent likelihood of successful valve repair.  Alternative treatment strategies were discussed.  Expectations for the patient's postoperative convalescence were discussed.  The patient understands and accepts all potential risks of surgery including but not limited to risk of death, stroke or other neurologic complication, myocardial infarction, congestive heart failure, respiratory failure, renal failure, bleeding requiring transfusion and/or reexploration, arrhythmia, infection or other wound complications, pneumonia, pleural and/or pericardial effusion, pulmonary embolus, aortic dissection or other major vascular complication, or  delayed complications related to valve repair or replacement including but not limited to structural valve deterioration and failure, thrombosis, embolization, endocarditis, or paravalvular leak.  All of their questions have been answered.  We tentatively plan for surgery on April 04, 2018.  The patient will return to our office for follow-up prior to surgery on April 02, 2018.   I spent in excess of 90 minutes during the conduct of this office consultation and >50% of this time involved direct face-to-face encounter with the patient for counseling and/or coordination of their care.    [name]. Roxy Manns, MD 03/05/2018 9:53 AM

## 2018-04-02 ENCOUNTER — Other Ambulatory Visit: Payer: Self-pay | Admitting: *Deleted

## 2018-04-02 ENCOUNTER — Ambulatory Visit (HOSPITAL_COMMUNITY): Payer: Medicare Other

## 2018-04-02 ENCOUNTER — Inpatient Hospital Stay (HOSPITAL_COMMUNITY): Admission: RE | Admit: 2018-04-02 | Payer: Medicare Other | Source: Ambulatory Visit

## 2018-04-02 ENCOUNTER — Encounter (HOSPITAL_COMMUNITY): Payer: Medicare Other

## 2018-04-02 ENCOUNTER — Ambulatory Visit: Payer: Medicare Other | Admitting: Thoracic Surgery (Cardiothoracic Vascular Surgery)

## 2018-04-04 ENCOUNTER — Inpatient Hospital Stay (HOSPITAL_COMMUNITY)
Admission: RE | Admit: 2018-04-04 | Payer: Medicare Other | Source: Home / Self Care | Admitting: Thoracic Surgery (Cardiothoracic Vascular Surgery)

## 2018-04-04 ENCOUNTER — Encounter (HOSPITAL_COMMUNITY): Admission: RE | Payer: Self-pay | Source: Home / Self Care

## 2018-04-04 ENCOUNTER — Other Ambulatory Visit: Payer: Self-pay | Admitting: Internal Medicine

## 2018-04-04 SURGERY — REPAIR, MITRAL VALVE
Anesthesia: General | Site: Chest

## 2018-04-04 MED ORDER — LOSARTAN POTASSIUM-HCTZ 50-12.5 MG PO TABS: ORAL_TABLET | ORAL | 1 refills | Status: DC

## 2018-04-04 MED ORDER — HYDROCHLOROTHIAZIDE 12.5 MG PO CAPS: 12.5000 mg | ORAL_CAPSULE | Freq: Every day | ORAL | 3 refills | Status: DC

## 2018-04-04 MED ORDER — LOSARTAN POTASSIUM 50 MG PO TABS: 50.0000 mg | ORAL_TABLET | Freq: Every day | ORAL | 3 refills | Status: DC

## 2018-04-04 NOTE — Telephone Encounter (Signed)
Copied from [place] [id number]. Topic: Quick Communication - Rx Refill/Question >> Apr 04, 2018 11:48 AM Judyann Munson wrote: Medication:losartan-hydrochlorothiazide (HYZAAR) 50-12.5 MG tablet  Has the patient contacted their pharmacy?yes   Preferred Pharmacy (with phone number or street name): [facility] [place], [place] [phone] (Phone) [phone] (Fax)    Agent: Please be advised that RX refills may take up to 3 business days. We ask that you follow-up with your pharmacy.

## 2018-04-04 NOTE — Addendum Note (Signed)
Addended by: Biagio Borg on: 04/04/2018 05:24 PM   Modules accepted: Orders

## 2018-04-04 NOTE — Telephone Encounter (Signed)
Done erx 

## 2018-04-04 NOTE — Telephone Encounter (Signed)
[facility] states the losartan-hydrochlorothiazide (HYZAAR) 50-12.5 MG tablet Is on back order and would have to be resent in either 2 separate meds or a different med will need to be sent in. Please advise.

## 2018-04-05 NOTE — Pre-Procedure Instructions (Signed)
Tami Wood  04/05/2018      [facility], [place] [address] Phone: [phone] Fax: [place], [place] - [address] [place] [address] Phone: [phone] Fax: [phone]    Your procedure is scheduled on Tuesday, January 14.  Report to [facility] Admitting at 5:30 AM                 Your surgery or procedure is scheduled for 7:30 A.M.   Call this number if you have problems the morning of surgery: [phone]  This is the number for the Pre- Surgical Desk.     Remember:  Do not eat or drink after midnight Monday, January 13.    Take these medicines the morning of surgery with A SIP OF WATER : If needed lansoprazole (PREVACID)   Follow Dr Guy Sandifer instructions on Aspirin   STOP taking  Aspirin Products (Goody Powder, Excedrin Migraine), Ibuprofen (Advil), Naproxen (Aleve), Vitamins and Herbal Products (ie Fish Oil, Tumeric).   Do not wear jewelry, make-up or nail polish.  Do not wear lotions, powders, or perfumes, or deodorant.  Do not shave 48 hours prior to surgery.  Men may shave face and neck.  Do not bring valuables to the hospital.  [facility] is not responsible for any belongings or valuables.  Contacts, dentures or bridgework may not be worn into surgery.  Leave your suitcase in the car.  After surgery it may be brought to your room.  For patients admitted to the hospital, discharge time will be determined by your treatment team.  Patients discharged the day of surgery will not be allowed to drive home.   Special instructions:  [place]- Preparing For Surgery  Before surgery, you can play an important role. Because skin is not sterile, your skin needs to be as free of germs as possible. You can reduce the number of germs on your skin by washing with CHG  (chlorahexidine gluconate) Soap before surgery.  CHG is an antiseptic cleaner which kills germs and bonds with the skin to continue killing germs even after washing.    Oral Hygiene is also important to reduce your risk of infection.  Remember - BRUSH YOUR TEETH THE MORNING OF SURGERY WITH YOUR REGULAR TOOTHPASTE  Please do not use if you have an allergy to CHG or antibacterial soaps. If your skin becomes reddened/irritated stop using the CHG.  Do not shave (including legs and underarms) for at least 48 hours prior to first CHG shower. It is OK to shave your face.  Please follow these instructions carefully.   1. Shower the NIGHT BEFORE SURGERY and the MORNING OF SURGERY with CHG.   2. If you chose to wash your hair, wash your hair first as usual with your normal shampoo.  3. After you shampoo, rinse your hair and body thoroughly to remove the shampoo.  4. Use CHG as you would any other liquid soap. You can apply CHG directly to the skin and wash gently with a scrungie or a clean washcloth.   5. Apply the CHG Soap to your body ONLY FROM THE NECK DOWN.  Do not use on open wounds or open sores. Avoid contact with your eyes, ears, mouth and genitals (private parts). Wash Face and genitals (private parts)  with  your normal soap.  6. Wash thoroughly, paying special attention to the area where your surgery will be performed.  7. Thoroughly rinse your body with warm water from the neck down.  8. DO NOT shower/wash with your normal soap after using and rinsing off the CHG Soap.  9. Pat yourself dry with a CLEAN TOWEL.  10. Wear CLEAN PAJAMAS to bed the night before surgery, wear comfortable clothes the morning of surgery  11. Place CLEAN SHEETS on your bed the night of your first shower and DO NOT SLEEP WITH PETS.  Day of Surgery:  Do not apply any deodorants/lotions.  Please wear clean clothes to the hospital/surgery center.   Remember to brush your teeth WITH YOUR REGULAR  TOOTHPASTE.  Please read over the following fact sheets that you were given.

## 2018-04-06 ENCOUNTER — Ambulatory Visit (HOSPITAL_COMMUNITY)
Admission: RE | Admit: 2018-04-06 | Discharge: 2018-04-06 | Disposition: A | Discharge status: Home / Self Care | Payer: Medicare Other | Source: Ambulatory Visit | Attending: Thoracic Surgery (Cardiothoracic Vascular Surgery) | Admitting: Thoracic Surgery (Cardiothoracic Vascular Surgery)

## 2018-04-06 ENCOUNTER — Encounter (HOSPITAL_COMMUNITY)
Admission: RE | Admit: 2018-04-06 | Discharge: 2018-04-06 | Disposition: A | Discharge status: Home / Self Care | Payer: Medicare Other | Source: Ambulatory Visit | Attending: Thoracic Surgery (Cardiothoracic Vascular Surgery) | Admitting: Thoracic Surgery (Cardiothoracic Vascular Surgery)

## 2018-04-06 ENCOUNTER — Other Ambulatory Visit: Payer: Self-pay | Admitting: *Deleted

## 2018-04-06 ENCOUNTER — Other Ambulatory Visit: Payer: Self-pay

## 2018-04-06 ENCOUNTER — Encounter (HOSPITAL_COMMUNITY): Payer: Self-pay

## 2018-04-06 DIAGNOSIS — J988 Other specified respiratory disorders: Secondary | ICD-10-CM | POA: Insufficient documentation

## 2018-04-06 DIAGNOSIS — I340 Nonrheumatic mitral (valve) insufficiency: Secondary | ICD-10-CM | POA: Diagnosis not present

## 2018-04-06 DIAGNOSIS — I517 Cardiomegaly: Secondary | ICD-10-CM | POA: Diagnosis not present

## 2018-04-06 DIAGNOSIS — I2510 Atherosclerotic heart disease of native coronary artery without angina pectoris: Secondary | ICD-10-CM

## 2018-04-06 HISTORY — DX: Personal history of urinary calculi: Z87.442

## 2018-04-06 HISTORY — DX: Unspecified osteoarthritis, unspecified site: M19.90

## 2018-04-06 LAB — BLOOD GAS, ARTERIAL
Acid-Base Excess: 2.2 mmol/L — ABNORMAL HIGH (ref 0.0–2.0)
Bicarbonate: 26.2 mmol/L (ref 20.0–28.0)
Drawn by: 421801
FIO2: 0.21
O2 Saturation: 96.9 %
PATIENT TEMPERATURE: 98.6
PO2 ART: 87.8 mmHg (ref 83.0–108.0)
pCO2 arterial: 40.8 mmHg (ref 32.0–48.0)
pH, Arterial: 7.424 (ref 7.350–7.450)

## 2018-04-06 LAB — PULMONARY FUNCTION TEST
DL/VA % pred: 92 %
DL/VA: 4.54 ml/min/mmHg/L
DLCO COR % PRED: 57 %
DLCO COR: 14.67 ml/min/mmHg
DLCO unc % pred: 54 %
DLCO unc: 14 ml/min/mmHg
FEF 25-75 Post: 2.84 L/sec
FEF 25-75 Pre: 2.75 L/sec
FEF2575-%Change-Post: 3 %
FEF2575-%PRED-PRE: 152 %
FEF2575-%Pred-Post: 157 %
FEV1-%Change-Post: 0 %
FEV1-%Pred-Post: 108 %
FEV1-%Pred-Pre: 108 %
FEV1-Post: 2.12 L
FEV1-Pre: 2.12 L
FEV1FVC-%Change-Post: 3 %
FEV1FVC-%PRED-PRE: 108 %
FEV6-%Change-Post: -2 %
FEV6-%Pred-Post: 100 %
FEV6-%Pred-Pre: 103 %
FEV6-POST: 2.43 L
FEV6-Pre: 2.49 L
FEV6FVC-%Change-Post: 1 %
FEV6FVC-%Pred-Post: 103 %
FEV6FVC-%Pred-Pre: 102 %
FVC-%CHANGE-POST: -3 %
FVC-%Pred-Post: 96 %
FVC-%Pred-Pre: 100 %
FVC-Post: 2.43 L
FVC-Pre: 2.52 L
Post FEV1/FVC ratio: 87 %
Post FEV6/FVC ratio: 100 %
Pre FEV1/FVC ratio: 84 %
Pre FEV6/FVC Ratio: 99 %
RV % pred: 87 %
RV: 1.93 L
TLC % pred: 85 %
TLC: 4.42 L

## 2018-04-06 LAB — URINALYSIS, ROUTINE W REFLEX MICROSCOPIC
Bilirubin Urine: NEGATIVE
GLUCOSE, UA: NEGATIVE mg/dL
Hgb urine dipstick: NEGATIVE
Ketones, ur: NEGATIVE mg/dL
Leukocytes, UA: NEGATIVE
Nitrite: NEGATIVE
Protein, ur: NEGATIVE mg/dL
Specific Gravity, Urine: 1.003 — ABNORMAL LOW (ref 1.005–1.030)
pH: 7 (ref 5.0–8.0)

## 2018-04-06 LAB — COMPREHENSIVE METABOLIC PANEL
ALT: 21 U/L (ref 0–44)
AST: 33 U/L (ref 15–41)
Albumin: 3.7 g/dL (ref 3.5–5.0)
Alkaline Phosphatase: 91 U/L (ref 38–126)
Anion gap: 7 (ref 5–15)
BUN: 9 mg/dL (ref 8–23)
CHLORIDE: 105 mmol/L (ref 98–111)
CO2: 22 mmol/L (ref 22–32)
Calcium: 9 mg/dL (ref 8.9–10.3)
Creatinine, Ser: 0.64 mg/dL (ref 0.44–1.00)
GFR calc Af Amer: >60 mL/min (ref >60)
GFR calc non Af Amer: >60 mL/min (ref >60)
Glucose, Bld: 96 mg/dL (ref 70–99)
Potassium: 3.9 mmol/L (ref 3.5–5.1)
Sodium: 134 mmol/L — ABNORMAL LOW (ref 135–145)
Total Bilirubin: 0.7 mg/dL (ref 0.3–1.2)
Total Protein: 8.7 g/dL — ABNORMAL HIGH (ref 6.5–8.1)

## 2018-04-06 LAB — PROTIME-INR
INR: 1.12
Prothrombin Time: 14.3 seconds (ref 11.4–15.2)

## 2018-04-06 LAB — CBC
HCT: 37.1 % (ref 36.0–46.0)
Hemoglobin: 12 g/dL (ref 12.0–15.0)
MCH: 29.4 pg (ref 26.0–34.0)
MCHC: 32.3 g/dL (ref 30.0–36.0)
MCV: 90.9 fL (ref 80.0–100.0)
Platelets: 155 10*3/uL (ref 150–400)
RBC: 4.08 MIL/uL (ref 3.87–5.11)
RDW: 13.1 % (ref 11.5–15.5)
WBC: 3.6 10*3/uL — ABNORMAL LOW (ref 4.0–10.5)
nRBC: 0 % (ref 0.0–0.2)

## 2018-04-06 LAB — HEMOGLOBIN A1C
HEMOGLOBIN A1C: 5.2 % (ref 4.8–5.6)
Mean Plasma Glucose: 102.54 mg/dL

## 2018-04-06 LAB — SURGICAL PCR SCREEN
MRSA, PCR: NEGATIVE
Staphylococcus aureus: NEGATIVE

## 2018-04-06 LAB — APTT: aPTT: 29 seconds (ref 24–36)

## 2018-04-06 LAB — ABO/RH: ABO/RH(D): O POS

## 2018-04-06 MED ORDER — ALBUTEROL SULFATE (2.5 MG/3ML) 0.083% IN NEBU: 2.5000 mg | INHALATION_SOLUTION | Freq: Once | RESPIRATORY_TRACT | Status: DC

## 2018-04-06 NOTE — Progress Notes (Signed)
PCP - Dr. Cathlean Cower Cardiologist - Dr. Virgina Jock, [facility]  Chest x-ray - 04/06/2018 EKG - 04/06/2018 Stress Test - 01/19/2018 ECHO - 01/11/2018 Cardiac Cath - 02/06/2018  Sleep Study - n/a CPAP -   Fasting Blood Sugar - n/a Checks Blood Sugar _____ times a day  Blood Thinner Instructions:  Aspirin Instructions: continue ASA until DOS  Anesthesia review: yes, hx of CAD  Patient denies shortness of breath, fever, cough and chest pain at PAT appointment   Patient verbalized understanding of instructions that were given to them at the PAT appointment. Patient was also instructed that they will need to review over the PAT instructions again at home before surgery.

## 2018-04-06 NOTE — Progress Notes (Signed)
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 More details please see preliminary notes on CV PROC under chart review.   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 Etiology unknown. Further evaluation needed if clinical concerns.  Aubery Douthat H Kellsie Grindle(RDMS RVT) 04/06/18 10:55 AM

## 2018-04-09 ENCOUNTER — Other Ambulatory Visit: Payer: Self-pay

## 2018-04-09 ENCOUNTER — Encounter: Payer: Self-pay | Admitting: Thoracic Surgery (Cardiothoracic Vascular Surgery)

## 2018-04-09 ENCOUNTER — Ambulatory Visit (INDEPENDENT_AMBULATORY_CARE_PROVIDER_SITE_OTHER): Payer: Medicare Other | Admitting: Thoracic Surgery (Cardiothoracic Vascular Surgery)

## 2018-04-09 VITALS — BP 133/76 | HR 76 | Resp 18 | Ht 65.0 in | Wt 169.8 lb

## 2018-04-09 DIAGNOSIS — I2510 Atherosclerotic heart disease of native coronary artery without angina pectoris: Secondary | ICD-10-CM | POA: Diagnosis not present

## 2018-04-09 DIAGNOSIS — I340 Nonrheumatic mitral (valve) insufficiency: Secondary | ICD-10-CM

## 2018-04-09 MED ORDER — SODIUM CHLORIDE 0.9 % IV SOLN
1.5000 g | INTRAVENOUS | Status: AC
  Administered 2018-04-10: 1.5 g via INTRAVENOUS
  Filled 2018-04-09: qty 1.5

## 2018-04-09 MED ORDER — KENNESTONE BLOOD CARDIOPLEGIA VIAL
13.0000 mL | Status: DC
  Filled 2018-04-09: qty 13

## 2018-04-09 MED ORDER — DEXMEDETOMIDINE HCL IN NACL 400 MCG/100ML IV SOLN
0.1000 ug/kg/h | INTRAVENOUS | Status: DC
  Filled 2018-04-09: qty 100

## 2018-04-09 MED ORDER — PLASMA-LYTE 148 IV SOLN
INTRAVENOUS | Status: AC
  Administered 2018-04-10: 500 mL
  Filled 2018-04-09: qty 2.5

## 2018-04-09 MED ORDER — POTASSIUM CHLORIDE 2 MEQ/ML IV SOLN
80.0000 meq | INTRAVENOUS | Status: DC
  Filled 2018-04-09: qty 40

## 2018-04-09 MED ORDER — VANCOMYCIN HCL 10 G IV SOLR
1250.0000 mg | INTRAVENOUS | Status: AC
  Administered 2018-04-10: 1250 mg via INTRAVENOUS
  Filled 2018-04-09: qty 1250

## 2018-04-09 MED ORDER — SODIUM CHLORIDE 0.9 % IV SOLN
750.0000 mg | INTRAVENOUS | Status: DC
  Filled 2018-04-09: qty 750

## 2018-04-09 MED ORDER — MAGNESIUM SULFATE 50 % IJ SOLN
40.0000 meq | INTRAMUSCULAR | Status: DC
  Filled 2018-04-09: qty 9.85

## 2018-04-09 MED ORDER — VANCOMYCIN HCL 1000 MG IV SOLR
INTRAVENOUS | Status: AC
  Administered 2018-04-10: 1000 mL
  Filled 2018-04-09: qty 1000

## 2018-04-09 MED ORDER — NITROGLYCERIN IN D5W 200-5 MCG/ML-% IV SOLN
2.0000 ug/min | INTRAVENOUS | Status: AC
  Administered 2018-04-10: 5 ug/min via INTRAVENOUS
  Filled 2018-04-09: qty 250

## 2018-04-09 MED ORDER — SODIUM CHLORIDE 0.9 % IV SOLN
INTRAVENOUS | Status: DC
  Filled 2018-04-09: qty 30

## 2018-04-09 MED ORDER — DOPAMINE-DEXTROSE 3.2-5 MG/ML-% IV SOLN
0.0000 ug/kg/min | INTRAVENOUS | Status: DC
  Filled 2018-04-09: qty 250

## 2018-04-09 MED ORDER — INSULIN REGULAR(HUMAN) IN NACL 100-0.9 UT/100ML-% IV SOLN
INTRAVENOUS | Status: DC
  Filled 2018-04-09: qty 100

## 2018-04-09 MED ORDER — KENNESTONE BLOOD CARDIOPLEGIA (KBC) MANNITOL SYRINGE (20%, 32ML)
32.0000 mL | Freq: Once | INTRAVENOUS | Status: DC
  Filled 2018-04-09: qty 32

## 2018-04-09 MED ORDER — MILRINONE LACTATE IN DEXTROSE 20-5 MG/100ML-% IV SOLN
0.3000 ug/kg/min | INTRAVENOUS | Status: DC
  Filled 2018-04-09: qty 100

## 2018-04-09 MED ORDER — TRANEXAMIC ACID (OHS) BOLUS VIA INFUSION
15.0000 mg/kg | INTRAVENOUS | Status: AC
  Administered 2018-04-10 (×2): 1155 mg via INTRAVENOUS
  Filled 2018-04-09: qty 1155

## 2018-04-09 MED ORDER — TRANEXAMIC ACID 1000 MG/10ML IV SOLN
1.5000 mg/kg/h | INTRAVENOUS | Status: AC
  Administered 2018-04-10: 1.5 mg/kg/h via INTRAVENOUS
  Filled 2018-04-09: qty 25

## 2018-04-09 MED ORDER — EPINEPHRINE PF 1 MG/ML IJ SOLN
0.0000 ug/min | INTRAVENOUS | Status: DC
  Filled 2018-04-09: qty 4

## 2018-04-09 MED ORDER — PHENYLEPHRINE HCL-NACL 20-0.9 MG/250ML-% IV SOLN
30.0000 ug/min | INTRAVENOUS | Status: DC
  Filled 2018-04-09 (×2): qty 250

## 2018-04-09 MED ORDER — NOREPINEPHRINE-SODIUM CHLORIDE 4-0.9 MG/250ML-% IV SOLN
0.0000 ug/min | INTRAVENOUS | Status: DC
  Filled 2018-04-09: qty 250

## 2018-04-09 MED ORDER — TRANEXAMIC ACID (OHS) PUMP PRIME SOLUTION
2.0000 mg/kg | INTRAVENOUS | Status: DC
  Filled 2018-04-09: qty 1.54

## 2018-04-09 NOTE — Patient Instructions (Signed)
   Continue taking all current medications without change through the day before surgery.  Have nothing to eat or drink after midnight the night before surgery.  On the morning of surgery take only Prevacid with a sip of water.    

## 2018-04-09 NOTE — H&P (Signed)
[address]       [place] [address]             [phone]          CARDIOTHORACIC SURGERY HISTORY AND PHYSICAL EXAM  Referring Provider is Patwardhan, Reynold Bowen, MD PCP is Biagio Borg, MD      Chief Complaint  Patient presents with  . Mitral Regurgitation    TEE 02/06/18, DENTAL PROCEDURE 12/25/17  . Coronary Artery Disease    CATH 02/06/18    HPI:  Patient is a 68 year old African-American female with long-standing history of heart murmur and essential hypertension who has been referred for surgical consultation to discuss treatment options for management of mitral valve prolapse with severe symptomatic primary mitral regurgitation and multivessel coronary artery disease.  Patient states that she has been told that she had a heart murmur all of her life.  However, she has never been formally evaluated by a cardiologist until recently.  She recently had some dental work done and her dentist insisted that she see a cardiologist to make certain that she was being treated appropriately for an endocarditis prophylaxis.  She has recently developed symptoms of exertional shortness of breath, although they occur only with more strenuous physical exertion such as going up and down a flight of stairs.  She was evaluated by Dr. Earnie Larsson, and routine transthoracic echocardiogram revealed normal left ventricular size and systolic function with severe prolapse involving the posterior leaflet of the mitral valve and severe mitral regurgitation.  Pulmonary artery pressures were mildly elevated.  Ejection fraction was estimated 55%.  The patient subsequently underwent transesophageal echocardiogram and diagnostic cardiac catheterization on February 06, 2018.  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  There was mild left atrial  enlargement.  Ejection fraction was estimated 55 to 60%.  Left and right heart catheterization revealed multivessel coronary artery disease including 80% stenosis of the proximal left anterior descending coronary artery and 70% stenosis of the distal right coronary artery.  Pulmonary artery pressures were minimally elevated.  There were large V waves on wedge tracing consistent with severe mitral regurgitation.  Cardiothoracic surgical consultation was requested.  Patient is single and lives alone locally in [place].  She has 3 sisters who she is very close with, all of whom accompany her for her office consultation visit today.  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  She drives an automobile and remains entirely functionally independent.  She does not exercise on a regular basis.  She states that recently she has developed shortness of breath with exertion, such as walking up a flight of stairs.  She complains that she gets tired more easily than she used to.  She denies any history of chest pain or chest tightness either with activity or at rest.  She denies any history of resting shortness of breath, PND, orthopnea, or lower extremity edema.  She has not had tachypalpitations, dizzy spells, nor syncope.  Patient is a 68 year old African-American female with long-standing history of heart murmur and essential hypertension who returns to the office today with tentative plans to proceed with elective mitral valve repair and CABG tomorrow.  She was originally seen in consultation on March 05, 2018.  She reports no new problems  or complaints.  She states that she still gets mild shortness of breath with exertion and she tires easily.  Symptoms have not progressed to any significant degree.  She denies any history of chest pain or chest tightness.  She has not had any recent fevers, chills,  or productive cough.   Past Medical History:  Diagnosis Date  . ALLERGIC RHINITIS 06/18/2009  . ANEMIA-IRON DEFICIENCY 11/28/2006  . ANXIETY 11/28/2006  . Arthritis   . Coronary artery disease   . Cough 06/18/2009  . DEPRESSION 11/28/2006  . GERD 12/10/2007  . History of kidney stones   . HYPERTENSION 11/28/2006  . MURMUR 11/28/2006  . PEPTIC ULCER DISEASE 12/10/2007    Past Surgical History:  Procedure Laterality Date  . ABDOMINAL HYSTERECTOMY    . CHOLECYSTECTOMY    . RIGHT/LEFT HEART CATH AND CORONARY ANGIOGRAPHY N/A 02/06/2018   Procedure: RIGHT/LEFT HEART CATH AND CORONARY ANGIOGRAPHY;  Surgeon: Nigel Mormon, MD;  Location: [facility];  Service: Cardiovascular;  Laterality: N/A;  . s/p multiple breast biopsy     negative  . TEE WITHOUT CARDIOVERSION N/A 02/06/2018   Procedure: TRANSESOPHAGEAL ECHOCARDIOGRAM (TEE);  Surgeon: Nigel Mormon, MD;  Location: [facility];  Service: Cardiovascular;  Laterality: N/A;    Family History  Problem Relation Age of Onset  . Transient ischemic attack Other   . Hypertension Other     Social History Social History   Tobacco Use  . Smoking status: Never Smoker  . Smokeless tobacco: Never Used  Substance Use Topics  . Alcohol use: No  . Drug use: Never    Prior to Admission medications   Medication Sig Start Date End Date Taking? Authorizing Provider  amoxicillin (AMOXIL) 500 MG capsule Take 2 capsules (1,000 mg total) by mouth 2 (two) times daily. Patient taking differently: Take 2,000 mg by mouth See admin instructions. For Dental Procedures 11/20/17   Biagio Borg, MD  aspirin 81 MG EC tablet Take 81 mg by mouth daily.      [provider]  hydrochlorothiazide (MICROZIDE) 12.5 MG capsule Take 1 capsule (12.5 mg total) by mouth daily. 04/04/18   Biagio Borg, MD  lansoprazole (PREVACID) 15 MG capsule Take 15 mg by mouth daily as needed (heartburn).     [provider]  losartan (COZAAR) 50 MG  tablet Take 1 tablet (50 mg total) by mouth daily. 04/04/18   Biagio Borg, MD  Multiple Vitamins-Minerals (MULTIVITAMIN WITH MINERALS) tablet Take 1 tablet by mouth daily.    [provider]  naproxen sodium (ALEVE) 220 MG tablet Take 440 mg by mouth daily as needed (pain).     [provider]  TURMERIC PO Take 1,000 mg by mouth daily as needed (pain).    [provider]    No Known Allergies    Review of Systems:              General:                      normal appetite, decreased energy, no weight gain, no weight loss, no fever             Cardiac:                       no chest pain with exertion, no chest pain at rest, +SOB with exertion, no resting SOB, no PND, no orthopnea, no palpitations, no arrhythmia, no atrial fibrillation,  no LE edema, no dizzy spells, no syncope             Respiratory:                 no shortness of breath, no home oxygen, no productive cough, no dry cough, no bronchitis, no wheezing, no hemoptysis, no asthma, no pain with inspiration or cough, no sleep apnea, no CPAP at night             GI:                               no difficulty swallowing, + reflux, no frequent heartburn, no hiatal hernia, no abdominal pain, no constipation, no diarrhea, no hematochezia, no hematemesis, no melena             GU:                              no dysuria,  no frequency, no urinary tract infection, no hematuria, no kidney stones, no kidney disease             Vascular:                     no pain suggestive of claudication, no pain in feet, no leg cramps, no varicose veins, no DVT, no non-healing foot ulcer             Neuro:                         no stroke, no TIA's, no seizures, no headaches, no temporary blindness one eye,  no slurred speech, no peripheral neuropathy, no chronic pain, no instability of gait, very mild memory/cognitive dysfunction             Musculoskeletal:         + arthritis primarily in right knee, no joint swelling, no  myalgias, no difficulty walking, normal mobility              Skin:                            no rash, no itching, no skin infections, no pressure sores or ulcerations             Psych:                         no anxiety, no depression, + nervousness, + unusual recent stress             Eyes:                           + blurry vision, no floaters, + recent vision changes, + wears glasses or contacts             ENT:                            no hearing loss, no loose or painful teeth, partial dentures, last saw dentist 01/05/2018             Hematologic:               no easy bruising, no abnormal bleeding, no clotting disorder,  no frequent epistaxis             Endocrine:                   no diabetes, does not check CBG's at home                           Physical Exam:              BP 138/81 (BP Location: Right Arm, Patient Position: Sitting, Cuff Size: Large)   Pulse 73   Resp 16   Ht 5\' 5"  (1.651 m)   Wt 170 lb (77.1 kg)   SpO2 98% Comment: ON RA  BMI 28.29 kg/m              General:                        well-appearing             HEENT:                       Unremarkable              Neck:                           no JVD, no bruits, no adenopathy              Chest:                          clear to auscultation, symmetrical breath sounds, no wheezes, no rhonchi              CV:                              RRR, grade IV/VI holosystolic murmur best at apex             Abdomen:                    soft, non-tender, no masses              Extremities:                 warm, well-perfused, pulses diminished but palpable, no LE edema             Rectal/GU                   Deferred             Neuro:                         Grossly non-focal and symmetrical throughout             Skin:                            Clean and dry, no rashes, no breakdown   Diagnostic Tests:  Transesophageal Echocardiography  Patient: Tami Wood, Tami Wood MR #: [id number] Study Date:  02/06/2018 Gender: F Age: 31 Height: 165.1 cm Weight: 77.1 kg BSA: 1.9 m^2 Pt. Status: Room:  ADMITTING Vernell Leep, MD ATTENDING Vernell Leep, MD ORDERING Vernell Leep, MD PERFORMING Vernell Leep, MD REFERRING Vernell Leep, MD SONOGRAPHER  Abigail Butts [facility]  cc:  ------------------------------------------------------------------- LV EF: 55% - 60%  ------------------------------------------------------------------- Indications: Mitral regurgitation 424.0.  ------------------------------------------------------------------- Study Conclusions  - Left ventricle: The cavity size was normal. Wall thickness was normal. The estimated ejection fraction was in the range of 55% to 60%. Wall motion was normal; there were no regional wall motion abnormalities. - Mitral valve: Myxomatous degeneration with P2 scallop prolapse. Severe primary mitral regurgitation, jet is eccentric and anteriorly directed. - Left atrium: The atrium was mildly dilated. - Pulmonary veins: Systolic reversal in left upper and lower pulmonary vein. (Seen in latter images)  ------------------------------------------------------------------- Study data: Study status: Routine. Consent: The risks, benefits, and alternatives to the procedure were explained to the patient and informed consent was obtained. Procedure: The patient reported no pain pre or post test. Initial setup. The patient was brought to the laboratory. Surface ECG leads were monitored. Sedation. Conscious sedation was administered by cardiology staff. Transesophageal echocardiography. Topical anesthesia was obtained using viscous lidocaine. An adult multiplane transesophageal probe was inserted by the attending cardiologistwithout difficulty. Image quality was good. Study completion: The patient tolerated the procedure well. There were no  complications. Administered medications: Fentanyl. Diagnostic transesophageal echocardiography. 2D and color Doppler. Birthdate: Patient birthdate: 08-Nov-1950. Age: Patient is 68 yr old. Sex: Gender: female. BMI: 28.3 kg/m^2. Blood pressure: 181/104 Patient status: Inpatient. Study date: Study date: 02/06/2018. Study time: 07:53 AM. Location: Endoscopy.  -------------------------------------------------------------------  ------------------------------------------------------------------- Left ventricle: The cavity size was normal. Wall thickness was normal. The estimated ejection fraction was in the range of 55% to 60%. Wall motion was normal; there were no regional wall motion abnormalities.  ------------------------------------------------------------------- Aortic valve: Structurally normal valve. Doppler: There was no regurgitation.  ------------------------------------------------------------------- Mitral valve: Myxomatous degeneration with P2 scallop prolapse. Severe primary mitral regurgitation, jet is eccentric and anteriorly directed.  ------------------------------------------------------------------- Left atrium: The atrium was mildly dilated. The appendage was morphologically a left appendage. Emptying velocity was normal.  ------------------------------------------------------------------- Pulmonary veins: Systolic reversal in left upper and lower pulmonary vein. (Seen in latter images)  ------------------------------------------------------------------- Right ventricle: The cavity size was normal. Systolic function was normal.  ------------------------------------------------------------------- Pulmonic valve: Structurally normal valve. Doppler: There was no regurgitation.  ------------------------------------------------------------------- Tricuspid valve: Structurally normal valve. Doppler:  There was no regurgitation.  ------------------------------------------------------------------- Right atrium: The atrium was normal in size.  ------------------------------------------------------------------- Pericardium: The pericardium was normal in appearance.  ------------------------------------------------------------------- Measurements  Aorta Value Ascending aorta ID, A-P, S 32 mm  Mitral valve Value Mitral maximal regurg velocity, PISA 503 cm/s Mitral regurg VTI, PISA 141 cm Mitral ERO, PISA 0.12 cm^2 Mitral regurg volume, PISA 17 ml  Legend: (L) and (H) mark values outside specified reference range.  ------------------------------------------------------------------- Prepared and Electronically Authenticated by  Vernell Leep, MD 2019-11-12T09:44:22   RIGHT/LEFT HEART CATH AND CORONARY ANGIOGRAPHY  Conclusion   LM: Normal LAD: Prox LAD/Diag bifurcation stenosis Prox LAD 70%, D1 ostial 60% stenosis Distal apical LAD 40% stenosis LCx: Normal RCA: Prox-mid RCA 30% stenosis Distal RCA 70% stenosis  Normal LVEDP, PCWP  Recommendation: CVTS consultation for possible MV repair and CABG  Nigel Mormon, MD [facility] [place] Pager: [phone] Office: [phone] If no answer Cell [phone]    Indications   Severe mitral regurgitation [I34.0 (ICD-10-CM)]  Procedural Details   Technical Details Procedures: 1. Right heart catheterization 2. Left heart catheterization 3. Selective left and right coronary angiography 4. Conscious sedation monitoring 32 min  Indication: Severe mitral regurgitation  History: 68 y/o [place] American female with posterior leaflet prolapse, severe anteriorly directed mitral  regurgitation,  abnormal exercise treadmill stress test.  Catheter/s advanced over guidewire under fluoroscopy 4 Fr balloon tipped wedge catheter 5 Fr JL 3.5 5 Fr TIG 4.0  Wires used: Inquire 0.025 " wire   Pressures tracings obtained in right atrium, right ventricle, pulmonary artery, and pulmonary capillary wedge position.   Anticoagulation:  4000 units heparin  Total contrast used: 40 cc   Total fluoro time: 5.6 min Air Kerma: 365 mGy  All wires and catheters removed out of the body at the end of the procedure Final angiogram showed no dissection/perforation         Estimated blood loss <50 mL.  During this procedure the patient was administered the following to achieve and maintain moderate conscious sedation: Versed 1 mg, Morphine 50 mg, while the patient's heart rate, blood pressure, and oxygen saturation were continuously monitored. The period of conscious sedation was 32 minutes, of which I was present face-to-face 100% of this time.  Medications  (Filter: Administrations occurring from 02/06/18 1026 to 02/06/18 1126)          Medication Rate/Dose/Volume Action  Date Time   fentaNYL (SUBLIMAZE) injection (mcg) 50 mcg Given 02/06/18 1046   Total dose as of 03/05/18 1044        50 mcg        midazolam (VERSED) injection (mg) 1 mg Given 02/06/18 1046   Total dose as of 03/05/18 1044        1 mg        lidocaine (PF) (XYLOCAINE) 1 % injection (mL) 1 mL Given 02/06/18 1052   Total dose as of 03/05/18 1044 1 mL Given 1053   2 mL        0.9 % sodium chloride infusion (mL/hr) 75 mL/hr New Bag/Given 02/06/18 1054   Dosing weight:  77.1 kg        Total dose as of 03/05/18 1044        Cannot be calculated        Radial Cocktail/Verapamil only (mL) 10 mL Given 02/06/18 1056   Total dose as of 03/05/18 1044        10 mL        heparin injection (Units) 4,000 Units Given 02/06/18 1110   Total dose as of 03/05/18  1044        4,000 Units        Heparin (Porcine) in NaCl 1000-0.9 UT/500ML-% SOLN (mL) 500 mL Given 02/06/18 1118   Total dose as of 03/05/18 1044 500 mL Given 1118   1,000 mL        iohexol (OMNIPAQUE) 350 MG/ML injection (mL) 40 mL Given 02/06/18 1118   Total dose as of 03/05/18 1044        40 mL        Sedation Time   Sedation Time Physician-1: 31 minutes 24 seconds  Complications   Complications documented before study signed (02/06/2018 5:54 PM EST)    RIGHT/LEFT HEART CATH AND CORONARY ANGIOGRAPHY   None Documented by Nigel Mormon, MD 02/06/2018 11:27 AM EST  Time Range: Intraprocedure      Coronary Findings   Diagnostic  Dominance: Right  Left Anterior Descending  Prox LAD lesion 80% stenosed  Prox LAD lesion is 80% stenosed.  Dist LAD lesion 40% stenosed  Dist LAD lesion is 40% stenosed.  First Diagonal Branch  Ost 1st Diag lesion 60% stenosed  Ost 1st Diag lesion is 60% stenosed.  Right Coronary Artery  Prox RCA to  Mid RCA lesion 30% stenosed  Prox RCA to Mid RCA lesion is 30% stenosed.  Dist RCA lesion 70% stenosed  Dist RCA lesion is 70% stenosed.  Intervention   No interventions have been documented.  Right Heart   Right Heart Pressures RA: 2 mmHg RV: 32/1 mmhg, RVEDP 5 mmHg PA: 31/8 mmHg, mean PA 19 mmHg PCWP: 10-13 mmHg with tall v waves in left sided pulmonary vein  LVEDP 13 mmHg  CO: 7.5 L/min CI: 4 L/min/m2  Coronary Diagrams   Diagnostic  Dominance: Right    Intervention   Implants       No implant documentation for this case.  MERGE Images   Show images for CARDIAC CATHETERIZATION   Link to Procedure Log   Procedure Log    Hemo Data    Most Recent Value  Fick Cardiac Output 7.52 L/min  Fick Cardiac Output Index 4.07 (L/min)/BSA  QP/QS 1      Impression:  Patient has mitral valve prolapse with stage D severe symptomatic primary mitral regurgitation and  multivessel coronary artery disease without angina pectoris. She describes recent onset and progression of symptoms of exertional shortness of breath and fatigue consistent with chronic diastolic congestive heart failure, [place] Heart Association functional class II. I have personally reviewed the patient's recent transesophageal echocardiogram and diagnostic cardiac catheterization. TEE reveals normal left ventricular systolic function with myxomatous degeneration of the mitral valve and a large segment involving the middle scallop (P2) of the posterior leaflet causing severe mitral regurgitation. There is left atrial enlargement. There are multiple elongated chordae tendinae.Diagnostic cardiac catheterization revealed severe multivessel coronary artery disease including high-grade 80% stenosis of the proximal left anterior descending coronary artery arising at takeoff of a medium-sized diagonal branch. There is 70% stenosis of the distal right coronary artery and right dominant coronary circulation. Right heart pressures were normal although there were large V waves on wedge tracing consistent with severe mitral regurgitation. I feel the patient would best be treated with mitral valve repair and coronary artery bypass grafting.    Plan:  The patientand one of her sisters were againcounseled at length regarding the indications, risks and potential benefits of mitral valve repair and coronary artery bypass grafting. The rationale for elective surgery has been explained, including a comparison between surgery and continued medical therapy with close follow-up. The likelihood of successful and durable valve repair has been discussed with particular reference to the findings of their recent echocardiogram. Based upon these findings and previous experience, I have quoted them a greater than 95percent likelihood of successful valve repair. Alternative treatment strategies were discussed.  Expectations for the patient's postoperative convalescence were discussed. The patient understands and accepts all potential risks of surgery including but not limited to risk of death, stroke or other neurologic complication, myocardial infarction, congestive heart failure, respiratory failure, renal failure, bleeding requiring transfusion and/or reexploration, arrhythmia, infection or other wound complications, pneumonia, pleural and/or pericardial effusion, pulmonary embolus, aortic dissection or other major vascular complication, or delayed complications related to valve repair or replacement including but not limited to structural valve deterioration and failure, thrombosis, embolization, endocarditis, or paravalvular leak. All of their questions have been answered.  We plan to proceed with surgery tomorrow.    [name]. Roxy Manns, MD 04/09/2018 9:05 AM

## 2018-04-09 NOTE — Progress Notes (Signed)
[address]       [place] [address]             [phone]     CARDIOTHORACIC SURGERY OFFICE NOTE  Referring Provider is Patwardhan, Reynold Bowen, MD PCP is Biagio Borg, MD   HPI:  Patient is a 68 year old African-American female with long-standing history of heart murmur and essential hypertension who returns to the office today with tentative plans to proceed with elective mitral valve repair and CABG tomorrow.  She was originally seen in consultation on March 05, 2018.  She reports no new problems or complaints.  She states that she still gets mild shortness of breath with exertion and she tires easily.  Symptoms have not progressed to any significant degree.  She denies any history of chest pain or chest tightness.  She has not had any recent fevers, chills, or productive cough.       Current Outpatient Medications  Medication Sig Dispense Refill  . amoxicillin (AMOXIL) 500 MG capsule Take 2 capsules (1,000 mg total) by mouth 2 (two) times daily. (Patient taking differently: Take 2,000 mg by mouth See admin instructions. For Dental Procedures) 40 capsule 0  . aspirin 81 MG EC tablet Take 81 mg by mouth daily.      . hydrochlorothiazide (MICROZIDE) 12.5 MG capsule Take 1 capsule (12.5 mg total) by mouth daily. 90 capsule 3  . lansoprazole (PREVACID) 15 MG capsule Take 15 mg by mouth daily as needed (heartburn).     . losartan (COZAAR) 50 MG tablet Take 1 tablet (50 mg total) by mouth daily. 90 tablet 3  . Multiple Vitamins-Minerals (MULTIVITAMIN WITH MINERALS) tablet Take 1 tablet by mouth daily.    . naproxen sodium (ALEVE) 220 MG tablet Take 440 mg by mouth daily as needed (pain).     . TURMERIC PO Take 1,000 mg by mouth daily as needed (pain).     No current facility-administered medications for this visit.       Physical Exam:   BP 133/76 (BP Location: Left Arm, Patient Position: Sitting, Cuff Size: Normal)   Pulse 76   Resp 18   Ht 5\' 5"   (1.651 m)   Wt 169 lb 12.8 oz (77 kg)   SpO2 94% Comment: RA  BMI 28.26 kg/m   General:  Well-appearing  Chest:   Clear to auscultation  CV:   Regular rate and rhythm with prominent holosystolic murmur  Incisions:  n/a  Abdomen:  Soft nontender  Extremities:  Warm and well-perfused  Diagnostic Tests:  CHEST - 2 VIEW  COMPARISON:  None.  FINDINGS: Mild cardiomegaly. Normal vascularity. Calcified granulomata in the left upper lobe have a benign appearance. Lungs are otherwise clear. No pneumothorax or pleural effusion.  IMPRESSION: Cardiomegaly without decompensation.   Electronically Signed   By: Marybelle Killings M.D.   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  She describes recent onset and progression of symptoms of exertional shortness of breath and fatigue consistent with chronic diastolic congestive heart failure, New York Heart Association functional class II.  I have personally reviewed the patient's recent transesophageal echocardiogram and diagnostic cardiac catheterization.   TEE reveals normal left ventricular systolic function with myxomatous degeneration of the mitral valve and a large segment involving the middle scallop (P2) of the posterior leaflet causing severe mitral regurgitation.  There is left atrial enlargement.  There are multiple elongated chordae tendinae.  Diagnostic cardiac catheterization revealed severe multivessel coronary artery disease including high-grade 80% stenosis of the proximal left anterior descending coronary artery arising at takeoff of a medium-sized diagonal branch.  There is 70% stenosis of the distal right coronary artery and right dominant coronary circulation.  Right heart pressures were normal although there were large V waves on wedge tracing consistent with severe mitral  regurgitation.  I feel the patient would best be treated with mitral valve repair and coronary artery bypass grafting.    Plan:  The patient and one of her sisters were again counseled at length regarding the indications, risks and potential benefits of mitral valve repair and coronary artery bypass grafting.  The rationale for elective surgery has been explained, including a comparison between surgery and continued medical therapy with close follow-up.  The likelihood of successful and durable valve repair has been discussed with particular reference to the findings of their recent echocardiogram.  Based upon these findings and previous experience, I have quoted them a greater than 95 percent likelihood of successful valve repair.  Alternative treatment strategies were discussed.  Expectations for the patient's postoperative convalescence were discussed.  The patient understands and accepts all potential risks of surgery including but not limited to risk of death, stroke or other neurologic complication, myocardial infarction, congestive heart failure, respiratory failure, renal failure, bleeding requiring transfusion and/or reexploration, arrhythmia, infection or other wound complications, pneumonia, pleural and/or pericardial effusion, pulmonary embolus, aortic dissection or other major vascular complication, or delayed complications related to valve repair or replacement including but not limited to structural valve deterioration and failure, thrombosis, embolization, endocarditis, or paravalvular leak.  All of their questions have been answered.  We plan to proceed with surgery tomorrow.  I spent in excess of 15 minutes during the conduct of this office consultation and >50% of this time involved direct face-to-face encounter with the patient for counseling and/or coordination of their care.    [name]. Roxy Manns, MD 04/09/2018 9:05 AM

## 2018-04-10 ENCOUNTER — Inpatient Hospital Stay (HOSPITAL_COMMUNITY): Payer: Medicare Other

## 2018-04-10 ENCOUNTER — Other Ambulatory Visit: Payer: Self-pay

## 2018-04-10 ENCOUNTER — Inpatient Hospital Stay (HOSPITAL_COMMUNITY): Payer: Medicare Other | Admitting: Physician Assistant

## 2018-04-10 ENCOUNTER — Encounter (HOSPITAL_COMMUNITY): Payer: Self-pay

## 2018-04-10 ENCOUNTER — Encounter (HOSPITAL_COMMUNITY)
Admission: RE | Disposition: A | Discharge status: Home / Self Care | Payer: Self-pay | Source: Home / Self Care | Attending: Thoracic Surgery (Cardiothoracic Vascular Surgery)

## 2018-04-10 ENCOUNTER — Inpatient Hospital Stay (HOSPITAL_COMMUNITY)
Admission: RE | Admit: 2018-04-10 | Discharge: 2018-04-15 | DRG: 220 | Disposition: A | Discharge status: Home / Self Care | Payer: Medicare Other | Attending: Thoracic Surgery (Cardiothoracic Vascular Surgery) | Admitting: Thoracic Surgery (Cardiothoracic Vascular Surgery)

## 2018-04-10 ENCOUNTER — Inpatient Hospital Stay (HOSPITAL_COMMUNITY): Payer: Medicare Other | Admitting: Anesthesiology

## 2018-04-10 DIAGNOSIS — I2510 Atherosclerotic heart disease of native coronary artery without angina pectoris: Secondary | ICD-10-CM | POA: Diagnosis not present

## 2018-04-10 DIAGNOSIS — Z9889 Other specified postprocedural states: Secondary | ICD-10-CM

## 2018-04-10 DIAGNOSIS — D696 Thrombocytopenia, unspecified: Secondary | ICD-10-CM | POA: Diagnosis not present

## 2018-04-10 DIAGNOSIS — J9811 Atelectasis: Secondary | ICD-10-CM | POA: Diagnosis not present

## 2018-04-10 DIAGNOSIS — Z87442 Personal history of urinary calculi: Secondary | ICD-10-CM | POA: Diagnosis not present

## 2018-04-10 DIAGNOSIS — I10 Essential (primary) hypertension: Secondary | ICD-10-CM | POA: Diagnosis present

## 2018-04-10 DIAGNOSIS — K219 Gastro-esophageal reflux disease without esophagitis: Secondary | ICD-10-CM | POA: Diagnosis present

## 2018-04-10 DIAGNOSIS — Z79899 Other long term (current) drug therapy: Secondary | ICD-10-CM

## 2018-04-10 DIAGNOSIS — J90 Pleural effusion, not elsewhere classified: Secondary | ICD-10-CM | POA: Diagnosis not present

## 2018-04-10 DIAGNOSIS — R000 Tachycardia, unspecified: Secondary | ICD-10-CM | POA: Diagnosis not present

## 2018-04-10 DIAGNOSIS — Z951 Presence of aortocoronary bypass graft: Secondary | ICD-10-CM

## 2018-04-10 DIAGNOSIS — I340 Nonrheumatic mitral (valve) insufficiency: Secondary | ICD-10-CM | POA: Diagnosis present

## 2018-04-10 DIAGNOSIS — E8770 Fluid overload, unspecified: Secondary | ICD-10-CM | POA: Diagnosis not present

## 2018-04-10 DIAGNOSIS — Z792 Long term (current) use of antibiotics: Secondary | ICD-10-CM | POA: Diagnosis not present

## 2018-04-10 DIAGNOSIS — R110 Nausea: Secondary | ICD-10-CM | POA: Diagnosis not present

## 2018-04-10 DIAGNOSIS — D62 Acute posthemorrhagic anemia: Secondary | ICD-10-CM | POA: Diagnosis not present

## 2018-04-10 DIAGNOSIS — Z7982 Long term (current) use of aspirin: Secondary | ICD-10-CM | POA: Diagnosis not present

## 2018-04-10 DIAGNOSIS — Z8249 Family history of ischemic heart disease and other diseases of the circulatory system: Secondary | ICD-10-CM

## 2018-04-10 DIAGNOSIS — I459 Conduction disorder, unspecified: Secondary | ICD-10-CM | POA: Diagnosis not present

## 2018-04-10 DIAGNOSIS — I083 Combined rheumatic disorders of mitral, aortic and tricuspid valves: Secondary | ICD-10-CM | POA: Diagnosis not present

## 2018-04-10 DIAGNOSIS — D509 Iron deficiency anemia, unspecified: Secondary | ICD-10-CM | POA: Diagnosis present

## 2018-04-10 DIAGNOSIS — I4949 Other premature depolarization: Secondary | ICD-10-CM | POA: Diagnosis not present

## 2018-04-10 DIAGNOSIS — I341 Nonrheumatic mitral (valve) prolapse: Principal | ICD-10-CM | POA: Diagnosis present

## 2018-04-10 DIAGNOSIS — J939 Pneumothorax, unspecified: Secondary | ICD-10-CM | POA: Diagnosis not present

## 2018-04-10 HISTORY — PX: MITRAL VALVE REPAIR: SHX2039

## 2018-04-10 HISTORY — DX: Presence of aortocoronary bypass graft: Z95.1

## 2018-04-10 HISTORY — DX: Other specified postprocedural states: Z98.890

## 2018-04-10 HISTORY — DX: Nonrheumatic mitral (valve) insufficiency: I34.0

## 2018-04-10 HISTORY — PX: TEE WITHOUT CARDIOVERSION: SHX5443

## 2018-04-10 HISTORY — PX: CORONARY ARTERY BYPASS GRAFT: SHX141

## 2018-04-10 LAB — POCT I-STAT, CHEM 8
BUN: 10 mg/dL (ref 8–23)
BUN: 9 mg/dL (ref 8–23)
BUN: 7 mg/dL — ABNORMAL LOW (ref 8–23)
BUN: 8 mg/dL (ref 8–23)
BUN: 8 mg/dL (ref 8–23)
BUN: 9 mg/dL (ref 8–23)
BUN: 8 mg/dL (ref 8–23)
BUN: 12 mg/dL (ref 8–23)
CREATININE: 0.4 mg/dL — AB (ref 0.44–1.00)
CREATININE: 0.7 mg/dL (ref 0.44–1.00)
Calcium, Ion: 1.21 mmol/L (ref 1.15–1.40)
Calcium, Ion: 1.19 mmol/L (ref 1.15–1.40)
Calcium, Ion: 0.89 mmol/L — CL (ref 1.15–1.40)
Calcium, Ion: 1.1 mmol/L — ABNORMAL LOW (ref 1.15–1.40)
Calcium, Ion: 1.06 mmol/L — ABNORMAL LOW (ref 1.15–1.40)
Calcium, Ion: 1.08 mmol/L — ABNORMAL LOW (ref 1.15–1.40)
Calcium, Ion: 1.09 mmol/L — ABNORMAL LOW (ref 1.15–1.40)
Calcium, Ion: 1.09 mmol/L — ABNORMAL LOW (ref 1.15–1.40)
Chloride: 104 mmol/L (ref 98–111)
Chloride: 106 mmol/L (ref 98–111)
Chloride: 102 mmol/L (ref 98–111)
Chloride: 105 mmol/L (ref 98–111)
Chloride: 107 mmol/L (ref 98–111)
Chloride: 108 mmol/L (ref 98–111)
Chloride: 110 mmol/L (ref 98–111)
Chloride: 110 mmol/L (ref 98–111)
Creatinine, Ser: 0.4 mg/dL — ABNORMAL LOW (ref 0.44–1.00)
Creatinine, Ser: 0.4 mg/dL — ABNORMAL LOW (ref 0.44–1.00)
Creatinine, Ser: 0.5 mg/dL (ref 0.44–1.00)
Creatinine, Ser: 0.4 mg/dL — ABNORMAL LOW (ref 0.44–1.00)
Creatinine, Ser: 0.4 mg/dL — ABNORMAL LOW (ref 0.44–1.00)
Creatinine, Ser: 0.4 mg/dL — ABNORMAL LOW (ref 0.44–1.00)
GLUCOSE: 104 mg/dL — AB (ref 70–99)
GLUCOSE: 147 mg/dL — AB (ref 70–99)
Glucose, Bld: 95 mg/dL (ref 70–99)
Glucose, Bld: 89 mg/dL (ref 70–99)
Glucose, Bld: 151 mg/dL — ABNORMAL HIGH (ref 70–99)
Glucose, Bld: 151 mg/dL — ABNORMAL HIGH (ref 70–99)
Glucose, Bld: 128 mg/dL — ABNORMAL HIGH (ref 70–99)
Glucose, Bld: 165 mg/dL — ABNORMAL HIGH (ref 70–99)
HCT: 32 % — ABNORMAL LOW (ref 36.0–46.0)
HCT: 32 % — ABNORMAL LOW (ref 36.0–46.0)
HCT: 26 % — ABNORMAL LOW (ref 36.0–46.0)
HCT: 23 % — ABNORMAL LOW (ref 36.0–46.0)
HCT: 20 % — ABNORMAL LOW (ref 36.0–46.0)
HCT: 21 % — ABNORMAL LOW (ref 36.0–46.0)
HCT: 25 % — ABNORMAL LOW (ref 36.0–46.0)
HCT: 24 % — ABNORMAL LOW (ref 36.0–46.0)
Hemoglobin: 10.9 g/dL — ABNORMAL LOW (ref 12.0–15.0)
Hemoglobin: 10.9 g/dL — ABNORMAL LOW (ref 12.0–15.0)
Hemoglobin: 8.8 g/dL — ABNORMAL LOW (ref 12.0–15.0)
Hemoglobin: 7.8 g/dL — ABNORMAL LOW (ref 12.0–15.0)
Hemoglobin: 6.8 g/dL — CL (ref 12.0–15.0)
Hemoglobin: 7.1 g/dL — ABNORMAL LOW (ref 12.0–15.0)
Hemoglobin: 8.5 g/dL — ABNORMAL LOW (ref 12.0–15.0)
Hemoglobin: 8.2 g/dL — ABNORMAL LOW (ref 12.0–15.0)
POTASSIUM: 4.9 mmol/L (ref 3.5–5.1)
Potassium: 3.9 mmol/L (ref 3.5–5.1)
Potassium: 3.7 mmol/L (ref 3.5–5.1)
Potassium: 3.9 mmol/L (ref 3.5–5.1)
Potassium: 5.2 mmol/L — ABNORMAL HIGH (ref 3.5–5.1)
Potassium: 5.2 mmol/L — ABNORMAL HIGH (ref 3.5–5.1)
Potassium: 4 mmol/L (ref 3.5–5.1)
Potassium: 5.4 mmol/L — ABNORMAL HIGH (ref 3.5–5.1)
Sodium: 139 mmol/L (ref 135–145)
Sodium: 141 mmol/L (ref 135–145)
Sodium: 142 mmol/L (ref 135–145)
Sodium: 138 mmol/L (ref 135–145)
Sodium: 138 mmol/L (ref 135–145)
Sodium: 139 mmol/L (ref 135–145)
Sodium: 142 mmol/L (ref 135–145)
Sodium: 143 mmol/L (ref 135–145)
TCO2: 31 mmol/L (ref 22–32)
TCO2: 29 mmol/L (ref 22–32)
TCO2: 28 mmol/L (ref 22–32)
TCO2: 28 mmol/L (ref 22–32)
TCO2: 25 mmol/L (ref 22–32)
TCO2: 26 mmol/L (ref 22–32)
TCO2: 24 mmol/L (ref 22–32)
TCO2: 25 mmol/L (ref 22–32)

## 2018-04-10 LAB — MAGNESIUM: Magnesium: 3 mg/dL — ABNORMAL HIGH (ref 1.7–2.4)

## 2018-04-10 LAB — CBC
HCT: 26.8 % — ABNORMAL LOW (ref 36.0–46.0)
HEMATOCRIT: 23.5 % — AB (ref 36.0–46.0)
Hemoglobin: 7.7 g/dL — ABNORMAL LOW (ref 12.0–15.0)
Hemoglobin: 8.6 g/dL — ABNORMAL LOW (ref 12.0–15.0)
MCH: 30.1 pg (ref 26.0–34.0)
MCH: 29.5 pg (ref 26.0–34.0)
MCHC: 32.8 g/dL (ref 30.0–36.0)
MCHC: 32.1 g/dL (ref 30.0–36.0)
MCV: 91.8 fL (ref 80.0–100.0)
MCV: 91.8 fL (ref 80.0–100.0)
Platelets: 74 10*3/uL — ABNORMAL LOW (ref 150–400)
Platelets: 143 10*3/uL — ABNORMAL LOW (ref 150–400)
RBC: 2.56 MIL/uL — ABNORMAL LOW (ref 3.87–5.11)
RBC: 2.92 MIL/uL — ABNORMAL LOW (ref 3.87–5.11)
RDW: 13.2 % (ref 11.5–15.5)
RDW: 13.3 % (ref 11.5–15.5)
WBC: 8.7 10*3/uL (ref 4.0–10.5)
WBC: 9.9 10*3/uL (ref 4.0–10.5)
nRBC: 0 % (ref 0.0–0.2)
nRBC: 0 % (ref 0.0–0.2)

## 2018-04-10 LAB — POCT I-STAT 3, ART BLOOD GAS (G3+)
Acid-Base Excess: 3 mmol/L — ABNORMAL HIGH (ref 0.0–2.0)
Acid-base deficit: 2 mmol/L (ref 0.0–2.0)
Acid-base deficit: 5 mmol/L — ABNORMAL HIGH (ref 0.0–2.0)
Acid-base deficit: 2 mmol/L (ref 0.0–2.0)
BICARBONATE: 20.8 mmol/L (ref 20.0–28.0)
Bicarbonate: 27.7 mmol/L (ref 20.0–28.0)
Bicarbonate: 24.5 mmol/L (ref 20.0–28.0)
Bicarbonate: 23.1 mmol/L (ref 20.0–28.0)
Bicarbonate: 23.8 mmol/L (ref 20.0–28.0)
O2 SAT: 100 %
O2 Saturation: 100 %
O2 Saturation: 99 %
O2 Saturation: 99 %
O2 Saturation: 99 %
PH ART: 7.452 — AB (ref 7.350–7.450)
PO2 ART: 314 mmHg — AB (ref 83.0–108.0)
Patient temperature: 36.4
Patient temperature: 36.1
TCO2: 29 mmol/L (ref 22–32)
TCO2: 26 mmol/L (ref 22–32)
TCO2: 24 mmol/L (ref 22–32)
TCO2: 22 mmol/L (ref 22–32)
TCO2: 25 mmol/L (ref 22–32)
pCO2 arterial: 39.7 mmHg (ref 32.0–48.0)
pCO2 arterial: 36.1 mmHg (ref 32.0–48.0)
pCO2 arterial: 38.2 mmHg (ref 32.0–48.0)
pCO2 arterial: 42.6 mmHg (ref 32.0–48.0)
pCO2 arterial: 45.9 mmHg (ref 32.0–48.0)
pH, Arterial: 7.441 (ref 7.350–7.450)
pH, Arterial: 7.378 (ref 7.350–7.450)
pH, Arterial: 7.293 — ABNORMAL LOW (ref 7.350–7.450)
pH, Arterial: 7.319 — ABNORMAL LOW (ref 7.350–7.450)
pO2, Arterial: 407 mmHg — ABNORMAL HIGH (ref 83.0–108.0)
pO2, Arterial: 124 mmHg — ABNORMAL HIGH (ref 83.0–108.0)
pO2, Arterial: 159 mmHg — ABNORMAL HIGH (ref 83.0–108.0)
pO2, Arterial: 132 mmHg — ABNORMAL HIGH (ref 83.0–108.0)

## 2018-04-10 LAB — PREPARE RBC (CROSSMATCH)

## 2018-04-10 LAB — POCT I-STAT 4, (NA,K, GLUC, HGB,HCT)
Glucose, Bld: 75 mg/dL (ref 70–99)
HCT: 20 % — ABNORMAL LOW (ref 36.0–46.0)
HEMOGLOBIN: 6.8 g/dL — AB (ref 12.0–15.0)
Potassium: 3.7 mmol/L (ref 3.5–5.1)
Sodium: 146 mmol/L — ABNORMAL HIGH (ref 135–145)

## 2018-04-10 LAB — CREATININE, SERUM
Creatinine, Ser: 0.67 mg/dL (ref 0.44–1.00)
GFR calc Af Amer: >60 mL/min (ref >60)
GFR calc non Af Amer: >60 mL/min (ref >60)

## 2018-04-10 LAB — PROTIME-INR
INR: 1.79
Prothrombin Time: 20.5 seconds — ABNORMAL HIGH (ref 11.4–15.2)

## 2018-04-10 LAB — GLUCOSE, CAPILLARY
Glucose-Capillary: 72 mg/dL (ref 70–99)
Glucose-Capillary: 80 mg/dL (ref 70–99)
Glucose-Capillary: 115 mg/dL — ABNORMAL HIGH (ref 70–99)
Glucose-Capillary: 156 mg/dL — ABNORMAL HIGH (ref 70–99)

## 2018-04-10 LAB — HEMOGLOBIN AND HEMATOCRIT, BLOOD
HCT: 20.3 % — ABNORMAL LOW (ref 36.0–46.0)
Hemoglobin: 6.7 g/dL — CL (ref 12.0–15.0)

## 2018-04-10 LAB — PLATELET COUNT: Platelets: 90 10*3/uL — ABNORMAL LOW (ref 150–400)

## 2018-04-10 LAB — APTT: aPTT: 45 seconds — ABNORMAL HIGH (ref 24–36)

## 2018-04-10 SURGERY — REPAIR, MITRAL VALVE
Anesthesia: General | Site: Chest

## 2018-04-10 MED ORDER — OXYCODONE HCL 5 MG PO TABS
5.0000 mg | ORAL_TABLET | ORAL | Status: DC | PRN
  Administered 2018-04-11 (×2): 10 mg via ORAL
  Administered 2018-04-12: 5 mg via ORAL
  Filled 2018-04-10 (×2): qty 2
  Filled 2018-04-10: qty 1

## 2018-04-10 MED ORDER — ROCURONIUM BROMIDE 50 MG/5ML IV SOSY
PREFILLED_SYRINGE | INTRAVENOUS | Status: AC
  Filled 2018-04-10: qty 5

## 2018-04-10 MED ORDER — SODIUM CHLORIDE 0.9% FLUSH: 3.0000 mL | INTRAVENOUS | Status: DC | PRN

## 2018-04-10 MED ORDER — ARTIFICIAL TEARS OPHTHALMIC OINT
TOPICAL_OINTMENT | OPHTHALMIC | Status: AC
  Filled 2018-04-10: qty 7

## 2018-04-10 MED ORDER — BISACODYL 5 MG PO TBEC
10.0000 mg | DELAYED_RELEASE_TABLET | Freq: Every day | ORAL | Status: DC
  Administered 2018-04-11 – 2018-04-12 (×2): 10 mg via ORAL
  Filled 2018-04-10 (×4): qty 2

## 2018-04-10 MED ORDER — NITROGLYCERIN IN D5W 200-5 MCG/ML-% IV SOLN: 0.0000 ug/min | INTRAVENOUS | Status: DC

## 2018-04-10 MED ORDER — DEXAMETHASONE SODIUM PHOSPHATE 10 MG/ML IJ SOLN
INTRAMUSCULAR | Status: AC
  Filled 2018-04-10: qty 1

## 2018-04-10 MED ORDER — PROTAMINE SULFATE 10 MG/ML IV SOLN
INTRAVENOUS | Status: AC
  Filled 2018-04-10: qty 25

## 2018-04-10 MED ORDER — SODIUM BICARBONATE 8.4 % IV SOLN
25.0000 meq | Freq: Once | INTRAVENOUS | Status: AC
  Administered 2018-04-10: 25 meq via INTRAVENOUS

## 2018-04-10 MED ORDER — MIDAZOLAM HCL 2 MG/2ML IJ SOLN: 2.0000 mg | INTRAMUSCULAR | Status: DC | PRN

## 2018-04-10 MED ORDER — SODIUM CHLORIDE 0.9 % IV SOLN
INTRAVENOUS | Status: DC | PRN
  Administered 2018-04-10: 750 mg via INTRAVENOUS

## 2018-04-10 MED ORDER — SODIUM CHLORIDE 0.9 % IR SOLN
Status: DC | PRN
  Administered 2018-04-10: 6000 mL

## 2018-04-10 MED ORDER — SODIUM CHLORIDE 0.9 % IV SOLN
INTRAVENOUS | Status: DC | PRN
  Administered 2018-04-10: .7 [IU]/h via INTRAVENOUS

## 2018-04-10 MED ORDER — SODIUM CHLORIDE 0.9 % IV SOLN
INTRAVENOUS | Status: AC
  Administered 2018-04-10: 14:00:00 via INTRAVENOUS

## 2018-04-10 MED ORDER — ACETAMINOPHEN 160 MG/5ML PO SOLN
650.0000 mg | Freq: Once | ORAL | Status: AC
  Administered 2018-04-10: 650 mg

## 2018-04-10 MED ORDER — GLYCOPYRROLATE PF 0.2 MG/ML IJ SOSY
PREFILLED_SYRINGE | INTRAMUSCULAR | Status: AC
  Filled 2018-04-10: qty 2

## 2018-04-10 MED ORDER — SODIUM CHLORIDE 0.9 % IV SOLN
1.5000 g | Freq: Two times a day (BID) | INTRAVENOUS | Status: AC
  Administered 2018-04-10 – 2018-04-12 (×4): 1.5 g via INTRAVENOUS
  Filled 2018-04-10 (×4): qty 1.5

## 2018-04-10 MED ORDER — PROTAMINE SULFATE 10 MG/ML IV SOLN
INTRAVENOUS | Status: AC
  Filled 2018-04-10: qty 5

## 2018-04-10 MED ORDER — ACETAMINOPHEN 650 MG RE SUPP: 650.0000 mg | Freq: Once | RECTAL | Status: AC

## 2018-04-10 MED ORDER — FAMOTIDINE IN NACL 20-0.9 MG/50ML-% IV SOLN
20.0000 mg | Freq: Two times a day (BID) | INTRAVENOUS | Status: AC
  Administered 2018-04-10 (×2): 20 mg via INTRAVENOUS
  Filled 2018-04-10: qty 50

## 2018-04-10 MED ORDER — CHLORHEXIDINE GLUCONATE 4 % EX LIQD: 30.0000 mL | CUTANEOUS | Status: DC

## 2018-04-10 MED ORDER — LACTATED RINGERS IV SOLN
INTRAVENOUS | Status: DC
  Administered 2018-04-10: 14:00:00 via INTRAVENOUS

## 2018-04-10 MED ORDER — LACTATED RINGERS IV SOLN
INTRAVENOUS | Status: DC | PRN
  Administered 2018-04-10: 07:00:00 via INTRAVENOUS

## 2018-04-10 MED ORDER — METOPROLOL TARTRATE 12.5 MG HALF TABLET
12.5000 mg | ORAL_TABLET | Freq: Once | ORAL | Status: AC
  Administered 2018-04-10: 12.5 mg via ORAL
  Filled 2018-04-10: qty 1

## 2018-04-10 MED ORDER — MIDAZOLAM HCL (PF) 10 MG/2ML IJ SOLN
INTRAMUSCULAR | Status: AC
  Filled 2018-04-10: qty 2

## 2018-04-10 MED ORDER — INSULIN REGULAR(HUMAN) IN NACL 100-0.9 UT/100ML-% IV SOLN: INTRAVENOUS | Status: DC

## 2018-04-10 MED ORDER — ONDANSETRON HCL 4 MG/2ML IJ SOLN
4.0000 mg | Freq: Four times a day (QID) | INTRAMUSCULAR | Status: DC | PRN
  Administered 2018-04-11 – 2018-04-12 (×4): 4 mg via INTRAVENOUS
  Filled 2018-04-10 (×4): qty 2

## 2018-04-10 MED ORDER — LACTATED RINGERS IV SOLN: INTRAVENOUS | Status: DC

## 2018-04-10 MED ORDER — ALBUMIN HUMAN 5 % IV SOLN
INTRAVENOUS | Status: DC | PRN
  Administered 2018-04-10: 13:00:00 via INTRAVENOUS

## 2018-04-10 MED ORDER — LACTATED RINGERS IV SOLN: 500.0000 mL | Freq: Once | INTRAVENOUS | Status: DC | PRN

## 2018-04-10 MED ORDER — FENTANYL CITRATE (PF) 250 MCG/5ML IJ SOLN
INTRAMUSCULAR | Status: DC | PRN
  Administered 2018-04-10: 200 ug via INTRAVENOUS
  Administered 2018-04-10: 300 ug via INTRAVENOUS
  Administered 2018-04-10: 150 ug via INTRAVENOUS
  Administered 2018-04-10: 100 ug via INTRAVENOUS
  Administered 2018-04-10: 250 ug via INTRAVENOUS
  Administered 2018-04-10: 50 ug via INTRAVENOUS
  Administered 2018-04-10: 200 ug via INTRAVENOUS

## 2018-04-10 MED ORDER — HEPARIN SODIUM (PORCINE) 1000 UNIT/ML IJ SOLN
INTRAMUSCULAR | Status: DC | PRN
  Administered 2018-04-10: 27000 [IU] via INTRAVENOUS

## 2018-04-10 MED ORDER — BISACODYL 10 MG RE SUPP: 10.0000 mg | Freq: Every day | RECTAL | Status: DC

## 2018-04-10 MED ORDER — INSULIN ASPART 100 UNIT/ML ~~LOC~~ SOLN
0.0000 [IU] | SUBCUTANEOUS | Status: DC
  Administered 2018-04-10: 4 [IU] via SUBCUTANEOUS
  Administered 2018-04-10 – 2018-04-11 (×2): 2 [IU] via SUBCUTANEOUS

## 2018-04-10 MED ORDER — METOPROLOL TARTRATE 5 MG/5ML IV SOLN: 2.5000 mg | INTRAVENOUS | Status: DC | PRN

## 2018-04-10 MED ORDER — SODIUM CHLORIDE 0.9% FLUSH
10.0000 mL | Freq: Two times a day (BID) | INTRAVENOUS | Status: DC
  Administered 2018-04-10 – 2018-04-14 (×6): 10 mL

## 2018-04-10 MED ORDER — FENTANYL CITRATE (PF) 250 MCG/5ML IJ SOLN
INTRAMUSCULAR | Status: AC
  Filled 2018-04-10: qty 5

## 2018-04-10 MED ORDER — GLUTARALDEHYDE 0.625% SOAKING SOLUTION
TOPICAL | Status: DC
  Filled 2018-04-10: qty 50

## 2018-04-10 MED ORDER — SODIUM CHLORIDE 0.9% FLUSH: 10.0000 mL | INTRAVENOUS | Status: DC | PRN

## 2018-04-10 MED ORDER — NITROGLYCERIN 0.2 MG/ML ON CALL CATH LAB
INTRAVENOUS | Status: DC | PRN
  Administered 2018-04-10: 40 ug via INTRAVENOUS

## 2018-04-10 MED ORDER — ONDANSETRON HCL 4 MG/2ML IJ SOLN
INTRAMUSCULAR | Status: DC | PRN
  Administered 2018-04-10: 4 mg via INTRAVENOUS

## 2018-04-10 MED ORDER — GLYCOPYRROLATE 0.2 MG/ML IJ SOLN
INTRAMUSCULAR | Status: DC | PRN
  Administered 2018-04-10 (×2): 0.2 mg via INTRAVENOUS

## 2018-04-10 MED ORDER — SODIUM CHLORIDE 0.9 % IV SOLN
INTRAVENOUS | Status: DC
  Administered 2018-04-10: 14:00:00 via INTRAVENOUS

## 2018-04-10 MED ORDER — SODIUM CHLORIDE 0.9 % IV SOLN
INTRAVENOUS | Status: DC | PRN
  Administered 2018-04-10: 13:00:00 via INTRAVENOUS

## 2018-04-10 MED ORDER — PROPOFOL 10 MG/ML IV BOLUS
INTRAVENOUS | Status: DC | PRN
  Administered 2018-04-10: 100 mg via INTRAVENOUS

## 2018-04-10 MED ORDER — TRAMADOL HCL 50 MG PO TABS: 50.0000 mg | ORAL_TABLET | ORAL | Status: DC | PRN

## 2018-04-10 MED ORDER — CHLORHEXIDINE GLUCONATE 0.12 % MT SOLN
15.0000 mL | OROMUCOSAL | Status: AC
  Administered 2018-04-10: 15 mL via OROMUCOSAL

## 2018-04-10 MED ORDER — SODIUM CHLORIDE 0.9% IV SOLUTION
Freq: Once | INTRAVENOUS | Status: AC
  Administered 2018-04-10: 16:00:00 via INTRAVENOUS

## 2018-04-10 MED ORDER — CHLORHEXIDINE GLUCONATE 0.12 % MT SOLN
15.0000 mL | Freq: Once | OROMUCOSAL | Status: AC
  Administered 2018-04-10: 15 mL via OROMUCOSAL
  Filled 2018-04-10: qty 15

## 2018-04-10 MED ORDER — PANTOPRAZOLE SODIUM 40 MG PO TBEC
40.0000 mg | DELAYED_RELEASE_TABLET | Freq: Every day | ORAL | Status: DC
  Administered 2018-04-12 – 2018-04-15 (×4): 40 mg via ORAL
  Filled 2018-04-10 (×4): qty 1

## 2018-04-10 MED ORDER — MILRINONE LACTATE IN DEXTROSE 20-5 MG/100ML-% IV SOLN
0.0000 ug/kg/min | INTRAVENOUS | Status: DC
  Administered 2018-04-10: 0.2 ug/kg/min via INTRAVENOUS
  Administered 2018-04-11: 0.13 ug/kg/min via INTRAVENOUS
  Filled 2018-04-10 (×2): qty 100

## 2018-04-10 MED ORDER — PROTAMINE SULFATE 10 MG/ML IV SOLN
INTRAVENOUS | Status: DC | PRN
  Administered 2018-04-10: 260 mg via INTRAVENOUS
  Administered 2018-04-10: 10 mg via INTRAVENOUS

## 2018-04-10 MED ORDER — ASPIRIN 81 MG PO CHEW: 324.0000 mg | CHEWABLE_TABLET | Freq: Every day | ORAL | Status: DC

## 2018-04-10 MED ORDER — ACETAMINOPHEN 500 MG PO TABS
1000.0000 mg | ORAL_TABLET | Freq: Four times a day (QID) | ORAL | Status: DC
  Administered 2018-04-11 – 2018-04-14 (×13): 1000 mg via ORAL
  Filled 2018-04-10 (×15): qty 2

## 2018-04-10 MED ORDER — SODIUM CHLORIDE (PF) 0.9 % IJ SOLN
OROMUCOSAL | Status: DC | PRN
  Administered 2018-04-10 (×2): 4 mL via TOPICAL

## 2018-04-10 MED ORDER — ROCURONIUM BROMIDE 100 MG/10ML IV SOLN
INTRAVENOUS | Status: DC | PRN
  Administered 2018-04-10 (×4): 50 mg via INTRAVENOUS

## 2018-04-10 MED ORDER — ACETAMINOPHEN 160 MG/5ML PO SOLN: 1000.0000 mg | Freq: Four times a day (QID) | ORAL | Status: DC

## 2018-04-10 MED ORDER — FENTANYL CITRATE (PF) 250 MCG/5ML IJ SOLN
INTRAMUSCULAR | Status: AC
  Filled 2018-04-10: qty 25

## 2018-04-10 MED ORDER — DOCUSATE SODIUM 100 MG PO CAPS
200.0000 mg | ORAL_CAPSULE | Freq: Every day | ORAL | Status: DC
  Administered 2018-04-11 – 2018-04-12 (×2): 200 mg via ORAL
  Filled 2018-04-10 (×4): qty 2

## 2018-04-10 MED ORDER — MAGNESIUM SULFATE 4 GM/100ML IV SOLN
4.0000 g | Freq: Once | INTRAVENOUS | Status: AC
  Administered 2018-04-10: 4 g via INTRAVENOUS
  Filled 2018-04-10: qty 100

## 2018-04-10 MED ORDER — INSULIN REGULAR BOLUS VIA INFUSION
0.0000 [IU] | Freq: Three times a day (TID) | INTRAVENOUS | Status: DC
  Filled 2018-04-10: qty 10

## 2018-04-10 MED ORDER — POTASSIUM CHLORIDE 10 MEQ/50ML IV SOLN
10.0000 meq | INTRAVENOUS | Status: AC
  Administered 2018-04-10 (×3): 10 meq via INTRAVENOUS

## 2018-04-10 MED ORDER — HEPARIN SODIUM (PORCINE) 1000 UNIT/ML IJ SOLN
INTRAMUSCULAR | Status: AC
  Filled 2018-04-10: qty 1

## 2018-04-10 MED ORDER — ONDANSETRON HCL 4 MG/2ML IJ SOLN
INTRAMUSCULAR | Status: AC
  Filled 2018-04-10: qty 2

## 2018-04-10 MED ORDER — PHENYLEPHRINE HCL-NACL 20-0.9 MG/250ML-% IV SOLN: 0.0000 ug/min | INTRAVENOUS | Status: DC

## 2018-04-10 MED ORDER — ASPIRIN EC 325 MG PO TBEC
325.0000 mg | DELAYED_RELEASE_TABLET | Freq: Every day | ORAL | Status: DC
  Administered 2018-04-11: 325 mg via ORAL
  Filled 2018-04-10: qty 1

## 2018-04-10 MED ORDER — SODIUM CHLORIDE 0.9% FLUSH
3.0000 mL | Freq: Two times a day (BID) | INTRAVENOUS | Status: DC
  Administered 2018-04-11 – 2018-04-14 (×6): 3 mL via INTRAVENOUS

## 2018-04-10 MED ORDER — ALBUMIN HUMAN 5 % IV SOLN
250.0000 mL | INTRAVENOUS | Status: AC | PRN
  Administered 2018-04-10 (×4): 12.5 g via INTRAVENOUS
  Filled 2018-04-10: qty 500

## 2018-04-10 MED ORDER — SODIUM CHLORIDE 0.45 % IV SOLN: INTRAVENOUS | Status: DC | PRN

## 2018-04-10 MED ORDER — SODIUM CHLORIDE 0.9 % IV SOLN
INTRAVENOUS | Status: DC | PRN
  Administered 2018-04-10: 0.2 ug/kg/h via INTRAVENOUS

## 2018-04-10 MED ORDER — DEXMEDETOMIDINE HCL IN NACL 200 MCG/50ML IV SOLN: 0.0000 ug/kg/h | INTRAVENOUS | Status: DC

## 2018-04-10 MED ORDER — ROCURONIUM BROMIDE 50 MG/5ML IV SOSY
PREFILLED_SYRINGE | INTRAVENOUS | Status: AC
  Filled 2018-04-10: qty 15

## 2018-04-10 MED ORDER — CHLORHEXIDINE GLUCONATE CLOTH 2 % EX PADS
6.0000 | MEDICATED_PAD | Freq: Every day | CUTANEOUS | Status: DC
  Administered 2018-04-11: 6 via TOPICAL

## 2018-04-10 MED ORDER — SODIUM CHLORIDE 0.9 % IV SOLN: 250.0000 mL | INTRAVENOUS | Status: DC

## 2018-04-10 MED ORDER — LACTATED RINGERS IV SOLN
INTRAVENOUS | Status: DC | PRN
  Administered 2018-04-10 (×3): via INTRAVENOUS

## 2018-04-10 MED ORDER — SODIUM CHLORIDE 0.9 % IV SOLN
INTRAVENOUS | Status: DC | PRN
  Administered 2018-04-10: 40 ug/min via INTRAVENOUS

## 2018-04-10 MED ORDER — MORPHINE SULFATE (PF) 2 MG/ML IV SOLN
1.0000 mg | INTRAVENOUS | Status: DC | PRN
  Administered 2018-04-10: 2 mg via INTRAVENOUS
  Administered 2018-04-11: 4 mg via INTRAVENOUS
  Administered 2018-04-12: 2 mg via INTRAVENOUS
  Filled 2018-04-10 (×2): qty 1
  Filled 2018-04-10: qty 2

## 2018-04-10 MED ORDER — SODIUM CHLORIDE 0.9 % IV SOLN
INTRAVENOUS | Status: DC | PRN
  Administered 2018-04-10: 20 ug/min via INTRAVENOUS

## 2018-04-10 MED ORDER — VANCOMYCIN HCL IN DEXTROSE 1-5 GM/200ML-% IV SOLN
1000.0000 mg | Freq: Once | INTRAVENOUS | Status: AC
  Administered 2018-04-10: 1000 mg via INTRAVENOUS
  Filled 2018-04-10: qty 200

## 2018-04-10 MED ORDER — MIDAZOLAM HCL 5 MG/5ML IJ SOLN
INTRAMUSCULAR | Status: DC | PRN
  Administered 2018-04-10: 1 mg via INTRAVENOUS
  Administered 2018-04-10 (×2): 3 mg via INTRAVENOUS
  Administered 2018-04-10: 2 mg via INTRAVENOUS
  Administered 2018-04-10: 3 mg via INTRAVENOUS
  Administered 2018-04-10: 2 mg via INTRAVENOUS
  Administered 2018-04-10: 3 mg via INTRAVENOUS
  Administered 2018-04-10 (×2): 1 mg via INTRAVENOUS
  Administered 2018-04-10 (×2): 2 mg via INTRAVENOUS

## 2018-04-10 MED ORDER — DEXAMETHASONE SODIUM PHOSPHATE 10 MG/ML IJ SOLN
INTRAMUSCULAR | Status: DC | PRN
  Administered 2018-04-10: 10 mg via INTRAVENOUS

## 2018-04-10 MED ORDER — PROPOFOL 10 MG/ML IV BOLUS
INTRAVENOUS | Status: AC
  Filled 2018-04-10: qty 20

## 2018-04-10 SURGICAL SUPPLY — 145 items
ADAPTER CARDIO PERF ANTE/RETRO (ADAPTER) ×3 IMPLANT
ADH SKN CLS APL DERMABOND .7 (GAUZE/BANDAGES/DRESSINGS) ×2
ADPR PRFSN 84XANTGRD RTRGD (ADAPTER) ×2
APPLICATOR COTTON TIP 6IN STRL (MISCELLANEOUS) IMPLANT
BAG DECANTER FOR FLEXI CONT (MISCELLANEOUS) ×8 IMPLANT
BANDAGE ACE 4X5 VEL STRL LF (GAUZE/BANDAGES/DRESSINGS) ×3 IMPLANT
BANDAGE ACE 6X5 VEL STRL LF (GAUZE/BANDAGES/DRESSINGS) ×3 IMPLANT
BANDAGE ELASTIC 4 VELCRO ST LF (GAUZE/BANDAGES/DRESSINGS) ×1 IMPLANT
BANDAGE ELASTIC 6 VELCRO ST LF (GAUZE/BANDAGES/DRESSINGS) ×1 IMPLANT
BASKET HEART (ORDER IN 25'S) (MISCELLANEOUS) ×1
BASKET HEART (ORDER IN 25S) (MISCELLANEOUS) ×2 IMPLANT
BLADE CLIPPER SURG (BLADE) IMPLANT
BLADE STERNUM SYSTEM 6 (BLADE) ×5 IMPLANT
BLADE SURG 11 STRL SS (BLADE) ×3 IMPLANT
BNDG GAUZE ELAST 4 BULKY (GAUZE/BANDAGES/DRESSINGS) ×3 IMPLANT
CANISTER SUCT 3000ML PPV (MISCELLANEOUS) ×5 IMPLANT
CANN PRFSN 3/8X14X24FR PCFC (MISCELLANEOUS)
CANN PRFSN 3/8XCNCT ST RT ANG (MISCELLANEOUS)
CANNULA EZ GLIDE AORTIC 21FR (CANNULA) ×7 IMPLANT
CANNULA GUNDRY RCSP 15FR (MISCELLANEOUS) IMPLANT
CANNULA PRFSN 3/8X14X24FR PCFC (MISCELLANEOUS) IMPLANT
CANNULA PRFSN 3/8XCNCT RT ANG (MISCELLANEOUS) IMPLANT
CANNULA VEN MTL TIP RT (MISCELLANEOUS)
CATH CPB KIT OWEN (MISCELLANEOUS) ×3 IMPLANT
CATH FOLEY 2WAY SLVR  5CC 14FR (CATHETERS)
CATH FOLEY 2WAY SLVR 5CC 14FR (CATHETERS) IMPLANT
CATH THORACIC 28FR RT ANG (CATHETERS) IMPLANT
CATH THORACIC 36FR (CATHETERS) ×3 IMPLANT
CLIP FOGARTY SPRING 6M (CLIP) IMPLANT
CLIP VESOCCLUDE MED 24/CT (CLIP) IMPLANT
CLIP VESOCCLUDE SM WIDE 24/CT (CLIP) IMPLANT
CONN 1/2X1/2X1/2  BEN (MISCELLANEOUS) ×1
CONN 1/2X1/2X1/2 BEN (MISCELLANEOUS) ×2 IMPLANT
CONN 3/8X1/2 ST GISH (MISCELLANEOUS) ×6 IMPLANT
CONN ST 1/4X3/8  BEN (MISCELLANEOUS) ×3
CONN ST 1/4X3/8 BEN (MISCELLANEOUS) IMPLANT
COVER SURGICAL LIGHT HANDLE (MISCELLANEOUS) ×6 IMPLANT
COVER WAND RF STERILE (DRAPES) ×4 IMPLANT
CRADLE DONUT ADULT HEAD (MISCELLANEOUS) ×5 IMPLANT
DERMABOND ADVANCED (GAUZE/BANDAGES/DRESSINGS) ×1
DERMABOND ADVANCED .7 DNX12 (GAUZE/BANDAGES/DRESSINGS) IMPLANT
DEVICE SUT CK QUICK LOAD MINI (Prosthesis & Implant Heart) ×2 IMPLANT
DRAIN CHANNEL 32F RND 10.7 FF (WOUND CARE) ×6 IMPLANT
DRAPE BILATERAL SPLIT (DRAPES) IMPLANT
DRAPE CARDIOVASCULAR INCISE (DRAPES) ×3
DRAPE CV SPLIT W-CLR ANES SCRN (DRAPES) IMPLANT
DRAPE INCISE IOBAN 66X45 STRL (DRAPES) ×5 IMPLANT
DRAPE SLUSH/WARMER DISC (DRAPES) ×3 IMPLANT
DRAPE SRG 135X102X78XABS (DRAPES) ×2 IMPLANT
DRSG AQUACEL AG ADV 3.5X14 (GAUZE/BANDAGES/DRESSINGS) ×1 IMPLANT
DRSG COVADERM 4X14 (GAUZE/BANDAGES/DRESSINGS) ×4 IMPLANT
ELECT BLADE 4.0 EZ CLEAN MEGAD (MISCELLANEOUS) ×3
ELECT REM PT RETURN 9FT ADLT (ELECTROSURGICAL) ×6
ELECTRODE BLDE 4.0 EZ CLN MEGD (MISCELLANEOUS) ×2 IMPLANT
ELECTRODE REM PT RTRN 9FT ADLT (ELECTROSURGICAL) ×8 IMPLANT
FELT TEFLON 1X6 (MISCELLANEOUS) ×9 IMPLANT
GAUZE SPONGE 4X4 12PLY STRL (GAUZE/BANDAGES/DRESSINGS) ×10 IMPLANT
GAUZE SPONGE 4X4 12PLY STRL LF (GAUZE/BANDAGES/DRESSINGS) ×1 IMPLANT
GLOVE BIO SURGEON STRL SZ 6 (GLOVE) IMPLANT
GLOVE BIO SURGEON STRL SZ 6.5 (GLOVE) IMPLANT
GLOVE BIO SURGEON STRL SZ7 (GLOVE) IMPLANT
GLOVE BIO SURGEON STRL SZ7.5 (GLOVE) IMPLANT
GLOVE ORTHO TXT STRL SZ7.5 (GLOVE) ×6 IMPLANT
GOWN STRL REUS W/ TWL LRG LVL3 (GOWN DISPOSABLE) ×16 IMPLANT
GOWN STRL REUS W/TWL LRG LVL3 (GOWN DISPOSABLE) ×24
HEMOSTAT POWDER SURGIFOAM 1G (HEMOSTASIS) ×15 IMPLANT
INSERT FOGARTY XLG (MISCELLANEOUS) ×5 IMPLANT
KIT BASIN OR (CUSTOM PROCEDURE TRAY) ×6 IMPLANT
KIT DILATOR VASC 18G NDL (KITS) ×2 IMPLANT
KIT DRAINAGE VACCUM ASSIST (KITS) ×1 IMPLANT
KIT SUCTION CATH 14FR (SUCTIONS) ×16 IMPLANT
KIT SUT CK MINI COMBO 4X17 (Prosthesis & Implant Heart) ×1 IMPLANT
KIT TURNOVER KIT B (KITS) ×5 IMPLANT
KIT VASOVIEW HEMOPRO 2 VH 4000 (KITS) ×3 IMPLANT
LEAD PACING MYOCARDI (MISCELLANEOUS) ×3 IMPLANT
LINE VENT (MISCELLANEOUS) ×1 IMPLANT
MARKER GRAFT CORONARY BYPASS (MISCELLANEOUS) ×9 IMPLANT
NS IRRIG 1000ML POUR BTL (IV SOLUTION) ×24 IMPLANT
PACK E OPEN HEART (SUTURE) ×3 IMPLANT
PACK OPEN HEART (CUSTOM PROCEDURE TRAY) ×5 IMPLANT
PAD ARMBOARD 7.5X6 YLW CONV (MISCELLANEOUS) ×10 IMPLANT
PAD ELECT DEFIB RADIOL ZOLL (MISCELLANEOUS) ×3 IMPLANT
PENCIL BUTTON HOLSTER BLD 10FT (ELECTRODE) ×3 IMPLANT
PUNCH AORTIC ROTATE 4.0MM (MISCELLANEOUS) IMPLANT
PUNCH AORTIC ROTATE 4.5MM 8IN (MISCELLANEOUS) IMPLANT
PUNCH AORTIC ROTATE 5MM 8IN (MISCELLANEOUS) IMPLANT
RING MITRAL MEMO 4D 28 (Prosthesis & Implant Heart) ×1 IMPLANT
SET CARDIOPLEGIA MPS 5001102 (MISCELLANEOUS) ×1 IMPLANT
SET IRRIG TUBING LAPAROSCOPIC (IRRIGATION / IRRIGATOR) ×3 IMPLANT
SOLUTION ANTI FOG 6CC (MISCELLANEOUS) IMPLANT
SPONGE LAP 18X18 X RAY DECT (DISPOSABLE) ×1 IMPLANT
SPONGE LAP 4X18 RFD (DISPOSABLE) ×1 IMPLANT
SUCKER INTRACARDIAC WEIGHTED (SUCKER) ×3 IMPLANT
SUT BONE WAX W31G (SUTURE) ×3 IMPLANT
SUT ETHIBOND (SUTURE) ×2 IMPLANT
SUT ETHIBOND 2 0 SH (SUTURE) ×7 IMPLANT
SUT ETHIBOND 2 0 SH 36X2 (SUTURE) ×4 IMPLANT
SUT ETHIBOND 2 0 V4 (SUTURE) IMPLANT
SUT ETHIBOND 2 0V4 GREEN (SUTURE) IMPLANT
SUT ETHIBOND 2-0 RB-1 WHT (SUTURE) ×2 IMPLANT
SUT ETHIBOND 4 0 TF (SUTURE) IMPLANT
SUT ETHIBOND 5 0 C 1 30 (SUTURE) ×3 IMPLANT
SUT ETHIBOND X763 2 0 SH 1 (SUTURE) ×6 IMPLANT
SUT MNCRL AB 3-0 PS2 18 (SUTURE) ×6 IMPLANT
SUT MNCRL AB 4-0 PS2 18 (SUTURE) IMPLANT
SUT PDS AB 1 CTX 36 (SUTURE) ×6 IMPLANT
SUT PROLENE 2 0 SH DA (SUTURE) IMPLANT
SUT PROLENE 3 0 SH 1 (SUTURE) ×4 IMPLANT
SUT PROLENE 3 0 SH DA (SUTURE) ×7 IMPLANT
SUT PROLENE 3 0 SH1 36 (SUTURE) ×1 IMPLANT
SUT PROLENE 4 0 RB 1 (SUTURE) ×6
SUT PROLENE 4 0 SH DA (SUTURE) ×7 IMPLANT
SUT PROLENE 4-0 RB1 .5 CRCL 36 (SUTURE) ×4 IMPLANT
SUT PROLENE 5 0 C 1 36 (SUTURE) IMPLANT
SUT PROLENE 6 0 C 1 30 (SUTURE) ×3 IMPLANT
SUT PROLENE 7.0 RB 3 (SUTURE) ×9 IMPLANT
SUT PROLENE 8 0 BV175 6 (SUTURE) IMPLANT
SUT PROLENE BLUE 7 0 (SUTURE) ×3 IMPLANT
SUT PROLENE POLY MONO (SUTURE) IMPLANT
SUT PTFE CHORD X 20MM (SUTURE) ×2 IMPLANT
SUT SILK  1 MH (SUTURE) ×1
SUT SILK 1 MH (SUTURE) ×2 IMPLANT
SUT STEEL 6MS V (SUTURE) IMPLANT
SUT STEEL STERNAL CCS#1 18IN (SUTURE) IMPLANT
SUT STEEL SZ 6 DBL 3X14 BALL (SUTURE) IMPLANT
SUT VIC AB 1 CTX 36 (SUTURE)
SUT VIC AB 1 CTX36XBRD ANBCTR (SUTURE) IMPLANT
SUT VIC AB 2-0 CT1 27 (SUTURE)
SUT VIC AB 2-0 CT1 TAPERPNT 27 (SUTURE) IMPLANT
SUT VIC AB 2-0 CTX 27 (SUTURE) IMPLANT
SUT VIC AB 3-0 SH 27 (SUTURE)
SUT VIC AB 3-0 SH 27X BRD (SUTURE) IMPLANT
SUT VIC AB 3-0 X1 27 (SUTURE) IMPLANT
SUT VICRYL 4-0 PS2 18IN ABS (SUTURE) IMPLANT
SYSTEM SAHARA CHEST DRAIN ATS (WOUND CARE) ×6 IMPLANT
TAPE CLOTH SOFT 2X10 (GAUZE/BANDAGES/DRESSINGS) ×1 IMPLANT
TAPE CLOTH SURG 4X10 WHT LF (GAUZE/BANDAGES/DRESSINGS) ×1 IMPLANT
TOWEL GREEN STERILE (TOWEL DISPOSABLE) ×5 IMPLANT
TOWEL GREEN STERILE FF (TOWEL DISPOSABLE) ×5 IMPLANT
TRAY FOLEY SLVR 16FR TEMP STAT (SET/KITS/TRAYS/PACK) ×5 IMPLANT
TUBING INSUFFLATION (TUBING) ×3 IMPLANT
TUBING INSUFFLATION 10FT LAP (TUBING) ×3 IMPLANT
TUBING STERILIZER 6 (MISCELLANEOUS) ×1 IMPLANT
UNDERPAD 30X30 (UNDERPADS AND DIAPERS) ×5 IMPLANT
WATER STERILE IRR 1000ML POUR (IV SOLUTION) ×10 IMPLANT

## 2018-04-10 NOTE — Progress Notes (Signed)
Patient ID: Tami Wood, female   DOB: May 25, 1950, 68 y.o.   MRN: [id number] EVENING ROUNDS NOTE :     [address]       ,[place] [address]             [phone]                 Day of Surgery Procedure(s) (LRB): MITRAL VALVE REPAIR (MVR) USING 28 MEMO 4D (N/A) CORONARY ARTERY BYPASS GRAFTING (CABG), ON PUMP, TIMES TWO, LIMA TO LAD AND SVG TO RCA USING LEFT INTERNAL MAMMARY ARTERY AND RIGHT GREATER SAPHENOUS VEIN HARVESTED ENDOSCOPICALLY (N/A) TRANSESOPHAGEAL ECHOCARDIOGRAM (TEE) (N/A)  Total Length of Stay:  LOS: 0 days  BP 113/63   Pulse 87   Temp 97.7 F (36.5 C)   Resp 20   SpO2 100%   .Intake/Output      01/13 0701 - 01/14 0700 01/14 0701 - 01/15 0700   I.V.  3216.2   Blood  1768   IV Piggyback  787.1   Total Intake  5771.4   Urine  1715   Blood  650   Chest Tube  310   Total Output  2675   Net  +3096.4          . sodium chloride    . sodium chloride 100 mL/hr at 04/10/18 1500  . [START ON 04/11/2018] sodium chloride    . sodium chloride 10 mL/hr at 04/10/18 1350  . cefUROXime (ZINACEF)  IV    . dexmedetomidine (PRECEDEX) IV infusion 0.5 mcg/kg/hr (04/10/18 1512)  . famotidine (PEPCID) IV Stopped (04/10/18 1421)  . insulin 0.5 mL/hr at 04/10/18 1500  . lactated ringers    . lactated ringers 10 mL/hr at 04/10/18 1348  . lactated ringers 20 mL/hr at 04/10/18 1500  . magnesium sulfate 20 mL/hr at 04/10/18 1500  . milrinone 0.2 mcg/kg/min (04/10/18 1541)  . nitroGLYCERIN    . phenylephrine (NEO-SYNEPHRINE) Adult infusion 30 mcg/min (04/10/18 1500)  . vancomycin       Lab Results  Component Value Date   WBC 8.7 04/10/2018   HGB 6.8 (LL) 04/10/2018   HCT 20.0 (L) 04/10/2018   PLT 74 (L) 04/10/2018   GLUCOSE 75 04/10/2018   CHOL 152 08/16/2017   TRIG 90.0 08/16/2017   HDL 51.30 08/16/2017   LDLCALC 83 08/16/2017   ALT 21 04/06/2018   AST 33 04/06/2018   NA 146 (H) 04/10/2018   K 3.7 04/10/2018   CL 110 04/10/2018   CREATININE  0.40 (L) 04/10/2018   BUN 8 04/10/2018   CO2 22 04/06/2018   TSH 0.47 08/16/2017   INR 1.79 04/10/2018   HGBA1C 5.2 04/06/2018    Ci up to 1.7-1.8 on milrinone, sleepy but wakes up  Chest drainage toatal 500 total , inr up, plst anf hgb low was given prbc, plts, ffp Starting to wean vent   Grace Isaac MD  Beeper [phone] Office [phone] 04/10/2018 6:36 PM

## 2018-04-10 NOTE — Anesthesia Procedure Notes (Signed)
Procedure Name: Intubation Date/Time: 04/10/2018 7:48 AM Performed by: Carney Living, CRNA Pre-anesthesia Checklist: Patient identified, Emergency Drugs available, Suction available, Patient being monitored and Timeout performed Patient Re-evaluated:Patient Re-evaluated prior to induction Oxygen Delivery Method: Circle system utilized Preoxygenation: Pre-oxygenation with 100% oxygen Induction Type: IV induction Ventilation: Mask ventilation without difficulty and Oral airway inserted - appropriate to patient size Laryngoscope Size: Sabra Heck and 2 Grade View: Grade I Tube type: Oral Tube size: 8.0 mm Number of attempts: 1 Airway Equipment and Method: Stylet Placement Confirmation: ETT inserted through vocal cords under direct vision,  positive ETCO2 and breath sounds checked- equal and bilateral Secured at: 22 cm Tube secured with: Tape Dental Injury: Teeth and Oropharynx as per pre-operative assessment  Comments: Intubation performed by Yevonne Aline, SRNA

## 2018-04-10 NOTE — Anesthesia Procedure Notes (Signed)
Central Venous Catheter Insertion Performed by: Roderic Palau, MD, anesthesiologist Start/End1/14/2020 6:45 AM, 04/10/2018 7:00 AM Patient location: Pre-op. Preanesthetic checklist: patient identified, IV checked, site marked, risks and benefits discussed, surgical consent, monitors and equipment checked, pre-op evaluation, timeout performed and anesthesia consent Position: Trendelenburg Lidocaine 1% used for infiltration and patient sedated Hand hygiene performed , maximum sterile barriers used  and Seldinger technique used Catheter size: 9 Fr Total catheter length 10. Central line was placed.MAC introducer Procedure performed using ultrasound guided technique. Ultrasound Notes:anatomy identified, needle tip was noted to be adjacent to the nerve/plexus identified, no ultrasound evidence of intravascular and/or intraneural injection and image(s) printed for medical record Attempts: 1 Following insertion, line sutured, dressing applied and Biopatch. Post procedure assessment: blood return through all ports, free fluid flow and no air  Patient tolerated the procedure well with no immediate complications.

## 2018-04-10 NOTE — Anesthesia Procedure Notes (Signed)
Arterial Line Insertion Start/End1/14/2020 6:40 AM, 04/10/2018 6:50 AM Performed by: Carney Living, CRNA, CRNA  Patient location: Pre-op. Preanesthetic checklist: patient identified, IV checked, pre-op evaluation and timeout performed Left, radial was placed Catheter size: 20 G Hand hygiene performed  and maximum sterile barriers used   Attempts: 1 Procedure performed without using ultrasound guided technique. Following insertion, dressing applied and Biopatch. Post procedure assessment: normal and unchanged  Patient tolerated the procedure well with no immediate complications. Additional procedure comments: A-Line placed by Yevonne Aline, SRNA.

## 2018-04-10 NOTE — Procedures (Signed)
Extubation Procedure Note  Patient Details:   Name: Tami Wood DOB: [birth year] MRN: [id number]   Airway Documentation:    Vent end date: 04/10/18 Vent end time: 2010   Evaluation  O2 sats: stable throughout Complications: No apparent complications Patient did tolerate procedure well. Bilateral Breath Sounds: Clear, Diminished   Patient extubated to 4lpm [name].  VC 775ml  NIF -22  Positive Cuff Leak Noted.  Patient able to vocalize post extubation.   Catha Brow 04/10/2018, 9:55 PM

## 2018-04-10 NOTE — Op Note (Signed)
CARDIOTHORACIC SURGERY OPERATIVE NOTE  Date of Procedure:  04/10/2018  Preoperative Diagnosis:   Severe Mitral Regurgitation  Severe Multi-vessel Coronary Artery Disease  Postoperative Diagnosis: Same  Procedure:    Mitral Valve Repair  Complex valvuloplasty including artificial Gore-tex neochord placement x8  Sorin Memo 4D ring annuloplasty (size 33mm, catalog #4DM-28, serial [id number])   Coronary Artery Bypass Grafting x 2   Left Internal Mammary Artery to Distal Left Anterior Descending Coronary Artery  Saphenous Vein Graft to Posterior Descending Coronary Artery  Endoscopic Vein Harvest from Right Thigh    Surgeon: [name]. 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  Multiple elongated primary chordae tendinae with severe prolapse of P2 segment of posterior leaflet  Type II mitral valve dysfunction with severe mitral regurgitation  Normal LV systolic function  Good quality left internal mammary artery conduit for grafting  Fair to good quality saphenous vein conduit for grafting  Good quality target vessels for grafting  No residual mitral regurgitation after successful valve repair       BRIEF CLINICAL NOTE AND INDICATIONS FOR SURGERY  Patient is a 68 year old African-American female with long-standing history of heart murmur and essential hypertension who has been referred for surgical consultation to discuss treatment options for management of mitral valve prolapse with severe symptomatic primary mitral regurgitation and multivessel coronary artery disease.  Patient states that she has been told that she had a heart murmur all of her life. However, she has never been formally evaluated by a cardiologist until recently. She recently had some dental work done and her dentist insisted that she see a cardiologist to make  certain that she was being treated appropriately for an endocarditis prophylaxis. She has recently developed symptoms of exertional shortness of breath, although they occur only with more strenuous physical exertion such as going up and down a flight of stairs. She was evaluated by Dr. Lavone Orn routine transthoracic echocardiogram revealed normal left ventricular size and systolic function with severe prolapse involving the posterior leaflet of the mitral valve and severe mitral regurgitation. Pulmonary artery pressures were mildly elevated. Ejection fraction was estimated 55%. The patient subsequently underwent transesophageal echocardiogram and diagnostic cardiac catheterization on February 06, 2018. 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 There was mild left atrial enlargement. Ejection fraction was estimated 55 to 60%. Left and right heart catheterization revealed multivessel coronary artery disease including 80% stenosis of the proximal left anterior descending coronary artery and 70% stenosis of the distal right coronary artery. Pulmonary artery pressures were minimally elevated. There were large V waves on wedge tracing consistent with severe mitral regurgitation. Cardiothoracic surgical consultation was requested.  The patient has been seen in consultation and counseled at length regarding the indications, risks and potential benefits of surgery.  All questions have been answered, and the patient provides full informed consent for the operation as described.    DETAILS OF THE OPERATIVE PROCEDURE  Preparation:  The patient is brought to the operating room on the above mentioned date and central monitoring was established by the anesthesia team including placement of Swan-Ganz catheter and radial arterial line. The patient is placed in the  supine position on the operating table.  Intravenous antibiotics are administered. General endotracheal anesthesia is induced uneventfully. A Foley catheter is placed.  Baseline transesophageal  echocardiogram was performed.  Findings were notable for myxomatous degenerative disease of the mitral valve.  There was an obvious prolapsing segment involving the middle scallop (P2) of the posterior leaflet of the mitral valve.  The jet of mitral regurgitation was directed eccentrically.  There was severe mitral regurgitation.  There was normal left ventricular systolic function.  There was trace central aortic insufficiency.  Right ventricular size and function was normal.  There was trace to mild central tricuspid regurgitation.  The patient's chest, abdomen, both groins, and both lower extremities are prepared and draped in a sterile manner. A time out procedure is performed.   Surgical Approach and Conduit Harvest:  A median sternotomy incision was performed and the left internal mammary artery is dissected from the chest wall and prepared for bypass grafting. The left internal mammary artery is notably good quality conduit. Simultaneously, the greater saphenous vein is obtained from the patient's right thigh using endoscopic vein harvest technique. The saphenous vein is notably fairly good quality conduit. After removal of the saphenous vein, the small surgical incisions in the lower extremity are closed with absorbable suture. Following systemic heparinization, the left internal mammary artery was transected distally noted to have excellent flow.   Extracorporeal Cardiopulmonary Bypass and Myocardial Protection:  The right common femoral vein is cannulated using the Seldinger technique and a guidewire advanced into the right atrium using TEE guidance.  The patient is heparinized systemically and the right common femoral vein cannulated using a 22 Fr long femoral venous cannula.  The ascending aorta is  cannulated for cardiopulmonary bypass.  Adequate heparinization is verified.   A retrograde cardioplegia cannula is placed through the right atrium into the coronary sinus.  The entire pre-bypass portion of the operation was notable for stable hemodynamics.  Cardiopulmonary bypass was begun and the surface of the heart is inspected.  A second venous cannula is placed directly into the superior vena cava.   A cardioplegia cannula is placed in the ascending aorta.  A temperature probe was placed in the interventricular septum.  The operative field is continuously flooded with carbon dioxide.  The patient is cooled to 32C systemic temperature.  The aortic cross clamp is applied and cardioplegia is delivered initially in an antegrade fashion through the aortic root using modified [place] cold blood cardioplegia (Kenn[place] blood cardioplegia protocol).   The initial cardioplegic arrest is rapid with early diastolic arrest.  Repeat doses of cardioplegia are administered at 90 minutes and every 30 minutes thereafter through the coronary sinus catheter and down the subsequently placed vein graft in order to maintain completely flat electrocardiogram.  Myocardial protection was felt to be excellent.   Coronary Artery Bypass Grafting:   The posterior descending branch of the right coronary artery was grafted using a reversed saphenous vein graft in an end-to-side fashion.  At the site of distal anastomosis the target vessel was good quality and measured approximately 2.0 mm in diameter.  The distal left anterior coronary artery was grafted with the left internal mammary artery in an end-to-side fashion.  At the site of distal anastomosis the target vessel was good quality and measured approximately 2.0 mm in diameter.   Mitral Valve Repair:  A left atriotomy incision was performed through the interatrial groove and extended partially across the back wall of the left atrium after opening the oblique sinus  inferiorly.  The mitral valve is exposed using a self-retaining retractor.  The mitral valve was inspected and notable for fibroelastic deficiency type  myxomatous degenerative disease.  There is severe prolapse involving the middle scallop of the posterior leaflet.  There are multiple elongated primary chordae tendinae, but there are no ruptured chordae tendinae.  The remainder of the valve appears essentially normal with minimal leaflet fibrosis and no significant calcification.  The overall size of the valve including the total surface area of the anterior leaflet is relatively small.  There is not a large amount of redundant leaflet tissue in the prolapsing segment.  Therefore a decision is made to proceed with valve repair without any associated leaflet resection.   Artificial neochord placement was performed using Chord-X multi-strand CV-4 Goretex pre-measured loops.  The appropriate cord length was measured from corresponding normal length primary cords from the P3 segment of the posterior leaflet. The papillary muscle suture of a Chord-X multi-strand suture was placed through the head of the posterior papillary muscle in a horizontal mattress fashion and tied over Teflon felt pledgets. Two of the three pre-measured loops were then reimplanted into the free margin of the P2 segment of the posterior leaflet on the posterior side of midline. The papillary muscle suture of a second Chord-X multi-strand suture was placed through the head of the anterior papillary muscle in a horizontal mattress fashion and tied over Teflon felt pledgets. Two of the three pre-measured loops were then reimplanted into the free margin of the P2 segment of the posterior leaflet on the anterior side of midline.   Interrupted 2-0 Ethibond horizontal mattress sutures were placed circumferentially around the entire mitral annulus.  These sutures will ultimately be utilized for ring annuloplasty, and at this juncture they are utilized  to suspend the valve symmetrically.  The valve was tested with saline and appeared competent even without ring annuloplasty complete. The valve was sized to a 28 mm annuloplasty ring, based upon the transverse distance between the left and right commissures and the height of the anterior leaflet, corresponding to a size just slightly larger than the overall surface area of the anterior leaflet.  A Sorin Memo 4D annuloplasty ring (size 28 mm, catalog #4DM-28, serial [id number]) was secured in place uneventfully. The valve was tested with saline and appeared competent.   The valve is again tested with saline and appears to be perfectly competent with a broad symmetrical line of coaptation of the anterior and posterior leaflet. There is no residual leak.  Rewarming is begun.   Procedure Completion:  The atriotomy was closed using a 2-layer closure of running 3-0 Prolene suture after placing a sump drain across the mitral valve to serve as a left ventricular vent.  The single proximal vein graft anastomosis was placed directly to the ascending aorta prior to removal of the aortic cross clamp.  The septal myocardial temperature rose rapidly after reperfusion of the left internal mammary artery graft.  One final dose of warm retrograde "reanimation dose" cardioplegia was administered retrograde through the coronary sinus catheter while all air was evacuated through the aortic root.  The aortic cross clamp was removed after a total cross clamp time of 124 minutes.  Epicardial pacing wires are fixed to the right ventricular outflow tract and to the right atrial appendage. The patient is rewarmed to 37C temperature. The aortic and left ventricular vents are removed.  The patient is weaned and disconnected from cardiopulmonary bypass.  The patient's rhythm at separation from bypass was AV paced.  The patient was weaned from cardiopulmonary bypass without any inotropic support. Total cardiopulmonary bypass time for the  operation  was 152 minutes.  Followup transesophageal echocardiogram performed after separation from bypass revealed a well-seated mitral annuloplasty ring and a mitral valve that was functioning normally and without any residual mitral regurgitation.  Left ventricular function was unchanged from preoperatively.  The aortic and superior vena cava cannula were removed uneventfully. Protamine was administered to reverse the anticoagulation. The femoral venous cannula was removed and manual pressure held on the groin for 30 minutes.  The mediastinum and pleural space were inspected for hemostasis and irrigated with saline solution. The mediastinum and both pleural spaces were drained using 4 chest tubes placed through separate stab incisions inferiorly.  The soft tissues anterior to the aorta were reapproximated loosely. The sternum is closed with double strength sternal wire. The soft tissues anterior to the sternum were closed in multiple layers and the skin is closed with a running subcuticular skin closure.   The post-bypass portion of the operation was notable for stable rhythm and hemodynamics.   No blood products were administered during the operation.   Disposition:  The patient tolerated the procedure well.  The patient was transported to the surgical intensive care unit in stable condition. There were no intraoperative complications. All sponge instrument and needle counts are verified correct at completion of the operation.    [name]. Roxy Manns MD 04/10/2018 1:23 PM

## 2018-04-10 NOTE — Brief Op Note (Signed)
04/10/2018  12:06 PM  PATIENT:  Tami Wood  68 y.o. female  PRE-OPERATIVE DIAGNOSIS:  1. 2 VESSEL CAD 2. SEVERE MR  POST-OPERATIVE DIAGNOSIS:  1. 2 VESSEL CAD 2. SEVERE MR   PROCEDURE:  TRANSESOPHAGEAL ECHOCARDIOGRAM (TEE), MEDIAN STERNOTOMY for CORONARY ARTERY BYPASS GRAFTING (CABG) x 2 (LIMA to LAD and SVG to RCA) with EVH from [place] and MITRAL VALVE REPAIR (MVR) (using a Sorin Memo 4D Annuloplasty ring Model# 4DM, Serial# [id number], size 28)  SURGEON:  Surgeon(s) and Role:    Rexene Alberts, MD - Primary  PHYSICIAN ASSISTANT: Lars Pinks PA-C  ANESTHESIA:   general  EBL:  800 mL  BLOOD ADMINISTERED:none  DRAINS: Chest tubes placed in the mediastinal and pleural spaces   COUNTS CORRECT :  YES  DICTATION: .Dragon Dictation  PLAN OF CARE: Admit to inpatient   PATIENT DISPOSITION:  ICU - intubated and hemodynamically stable.   Delay start of Pharmacological VTE agent (>24hrs) due to surgical blood loss or risk of bleeding: yes

## 2018-04-10 NOTE — Anesthesia Procedure Notes (Signed)
Central Venous Catheter Insertion Performed by: Roderic Palau, MD, anesthesiologist Start/End1/14/2020 6:45 AM, 04/10/2018 7:00 AM Patient location: Pre-op. Preanesthetic checklist: patient identified, IV checked, site marked, risks and benefits discussed, surgical consent, monitors and equipment checked, pre-op evaluation, timeout performed and anesthesia consent Hand hygiene performed  and maximum sterile barriers used  PA cath was placed.Swan type:thermodilution PA Cath depth:45 Procedure performed without using ultrasound guided technique. Attempts: 1 Patient tolerated the procedure well with no immediate complications.

## 2018-04-10 NOTE — Interval H&P Note (Signed)
History and Physical Interval Note:  04/10/2018 6:42 AM  Tami Wood  has presented today for surgery, with the diagnosis of MR CAD  The various methods of treatment have been discussed with the patient and family. After consideration of risks, benefits and other options for treatment, the patient has consented to  Procedure(s): MITRAL VALVE REPAIR (MVR) (N/A) CORONARY ARTERY BYPASS GRAFTING (CABG) (N/A) TRANSESOPHAGEAL ECHOCARDIOGRAM (TEE) (N/A) as a surgical intervention .  The patient's history has been reviewed, patient examined, no change in status, stable for surgery.  I have reviewed the patient's chart and labs.  Questions were answered to the patient's satisfaction.     Rexene Alberts

## 2018-04-10 NOTE — Transfer of Care (Signed)
Immediate Anesthesia Transfer of Care Note  Patient: Tami Wood  Procedure(s) Performed: MITRAL VALVE REPAIR (MVR) USING 28 MEMO 4D (N/A Chest) CORONARY ARTERY BYPASS GRAFTING (CABG), ON PUMP, TIMES TWO, LIMA TO LAD AND SVG TO RCA USING LEFT INTERNAL MAMMARY ARTERY AND RIGHT GREATER SAPHENOUS VEIN HARVESTED ENDOSCOPICALLY (N/A Chest) TRANSESOPHAGEAL ECHOCARDIOGRAM (TEE) (N/A )  Patient Location: SICU  Anesthesia Type:General  Level of Consciousness: sedated and Patient remains intubated per anesthesia plan  Airway & Oxygen Therapy: Patient remains intubated per anesthesia plan and Patient placed on Ventilator (see vital sign flow sheet for setting)  Post-op Assessment: Report given to RN and Post -op Vital signs reviewed and stable  Post vital signs: Reviewed and stable  Last Vitals:  Vitals Value Taken Time  BP 102/81 04/10/2018  1:54 PM  Temp 34.9 C 04/10/2018  1:59 PM  Pulse 80 04/10/2018  1:59 PM  Resp 14 04/10/2018  1:59 PM  SpO2 100 % 04/10/2018  1:59 PM  Vitals shown include unvalidated device data.  Last Pain:  Vitals:   04/10/18 0618  TempSrc:   PainSc: 0-No pain      Patients Stated Pain Goal: 4 (13/08/65 [id number])  Complications: No apparent anesthesia complications

## 2018-04-10 NOTE — Progress Notes (Signed)
Pt's pH after weaning protocol 7.29. Pt passed all other weaning parameters. Dr. Servando Snare paged about result before extubation. Verbal orders given to give 1/2amp of Bicarb before extubation. Will give & monitor pt closely.

## 2018-04-10 NOTE — Anesthesia Preprocedure Evaluation (Addendum)
Anesthesia Evaluation  Patient identified by MRN, date of birth, ID band Patient awake    Reviewed: Allergy & Precautions, H&P , NPO status , Patient's Chart, lab work & pertinent test results, reviewed documented beta blocker date and time   Airway Mallampati: II   Neck ROM: full    Dental  (+) Teeth Intact, Partial Upper, Dental Advisory Given   Pulmonary neg pulmonary ROS,    breath sounds clear to auscultation       Cardiovascular hypertension, Pt. on medications and Pt. on home beta blockers + CAD  + Valvular Problems/Murmurs MR  Rhythm:regular Rate:Normal     Neuro/Psych PSYCHIATRIC DISORDERS Anxiety Depression    GI/Hepatic PUD, GERD  Medicated and Controlled,  Endo/Other    Renal/GU      Musculoskeletal  (+) Arthritis , Osteoarthritis,    Abdominal   Peds  Hematology  (+) anemia ,   Anesthesia Other Findings   Reproductive/Obstetrics                            Anesthesia Physical Anesthesia Plan  ASA: III  Anesthesia Plan: General   Post-op Pain Management:    Induction: Intravenous  PONV Risk Score and Plan: 3 and Ondansetron, Dexamethasone, Midazolam and Treatment may vary due to age or medical condition  Airway Management Planned: Oral ETT  Additional Equipment: Arterial line, CVP, PA Cath, TEE, 3D TEE and Ultrasound Guidance Line Placement  Intra-op Plan:   Post-operative Plan: Post-operative intubation/ventilation  Informed Consent: I have reviewed the patients History and Physical, chart, labs and discussed the procedure including the risks, benefits and alternatives for the proposed anesthesia with the patient or authorized representative who has indicated his/her understanding and acceptance.   Dental advisory given  Plan Discussed with: CRNA, Anesthesiologist and Surgeon  Anesthesia Plan Comments:        Anesthesia Quick Evaluation

## 2018-04-10 NOTE — Progress Notes (Signed)
  Echocardiogram Echocardiogram Transesophageal has been performed.  Darlina Sicilian M 04/10/2018, 8:05 AM

## 2018-04-11 ENCOUNTER — Inpatient Hospital Stay (HOSPITAL_COMMUNITY): Payer: Medicare Other

## 2018-04-11 ENCOUNTER — Encounter (HOSPITAL_COMMUNITY): Payer: Self-pay | Admitting: Thoracic Surgery (Cardiothoracic Vascular Surgery)

## 2018-04-11 LAB — GLUCOSE, CAPILLARY
GLUCOSE-CAPILLARY: 153 mg/dL — AB (ref 70–99)
Glucose-Capillary: 161 mg/dL — ABNORMAL HIGH (ref 70–99)
Glucose-Capillary: 113 mg/dL — ABNORMAL HIGH (ref 70–99)
Glucose-Capillary: 144 mg/dL — ABNORMAL HIGH (ref 70–99)
Glucose-Capillary: 191 mg/dL — ABNORMAL HIGH (ref 70–99)
Glucose-Capillary: 200 mg/dL — ABNORMAL HIGH (ref 70–99)
Glucose-Capillary: 166 mg/dL — ABNORMAL HIGH (ref 70–99)
Glucose-Capillary: 130 mg/dL — ABNORMAL HIGH (ref 70–99)

## 2018-04-11 LAB — BPAM FFP
Blood Product Expiration Date: 202001152359
Blood Product Expiration Date: 202001152359
ISSUE DATE / TIME: 202001141552
ISSUE DATE / TIME: 202001141552
Unit Type and Rh: 600
Unit Type and Rh: 6200

## 2018-04-11 LAB — CREATININE, SERUM
CREATININE: 0.99 mg/dL (ref 0.44–1.00)
GFR calc Af Amer: >60 mL/min (ref >60)
GFR calc non Af Amer: 58 mL/min — ABNORMAL LOW (ref >60)

## 2018-04-11 LAB — CBC
HCT: 27.1 % — ABNORMAL LOW (ref 36.0–46.0)
HCT: 26.2 % — ABNORMAL LOW (ref 36.0–46.0)
Hemoglobin: 8.8 g/dL — ABNORMAL LOW (ref 12.0–15.0)
Hemoglobin: 8.5 g/dL — ABNORMAL LOW (ref 12.0–15.0)
MCH: 29.3 pg (ref 26.0–34.0)
MCH: 30.1 pg (ref 26.0–34.0)
MCHC: 32.5 g/dL (ref 30.0–36.0)
MCHC: 32.4 g/dL (ref 30.0–36.0)
MCV: 90.3 fL (ref 80.0–100.0)
MCV: 92.9 fL (ref 80.0–100.0)
PLATELETS: 131 10*3/uL — AB (ref 150–400)
Platelets: 157 10*3/uL (ref 150–400)
RBC: 3 MIL/uL — ABNORMAL LOW (ref 3.87–5.11)
RBC: 2.82 MIL/uL — ABNORMAL LOW (ref 3.87–5.11)
RDW: 13.6 % (ref 11.5–15.5)
RDW: 14 % (ref 11.5–15.5)
WBC: 12.4 10*3/uL — AB (ref 4.0–10.5)
WBC: 16.4 10*3/uL — ABNORMAL HIGH (ref 4.0–10.5)
nRBC: 0 % (ref 0.0–0.2)
nRBC: 0 % (ref 0.0–0.2)

## 2018-04-11 LAB — BASIC METABOLIC PANEL
Anion gap: 5 (ref 5–15)
BUN: 11 mg/dL (ref 8–23)
CO2: 21 mmol/L — ABNORMAL LOW (ref 22–32)
Calcium: 7.8 mg/dL — ABNORMAL LOW (ref 8.9–10.3)
Chloride: 114 mmol/L — ABNORMAL HIGH (ref 98–111)
Creatinine, Ser: 0.7 mg/dL (ref 0.44–1.00)
GFR calc non Af Amer: >60 mL/min (ref >60)
Glucose, Bld: 144 mg/dL — ABNORMAL HIGH (ref 70–99)
Potassium: 4.1 mmol/L (ref 3.5–5.1)
Sodium: 140 mmol/L (ref 135–145)

## 2018-04-11 LAB — PREPARE PLATELET PHERESIS
Unit division: 0
Unit division: 0

## 2018-04-11 LAB — MAGNESIUM
Magnesium: 2.4 mg/dL (ref 1.7–2.4)
Magnesium: 2.3 mg/dL (ref 1.7–2.4)

## 2018-04-11 LAB — POCT I-STAT, CHEM 8
BUN: 17 mg/dL (ref 8–23)
Calcium, Ion: 1.19 mmol/L (ref 1.15–1.40)
Chloride: 105 mmol/L (ref 98–111)
Creatinine, Ser: 0.8 mg/dL (ref 0.44–1.00)
Glucose, Bld: 171 mg/dL — ABNORMAL HIGH (ref 70–99)
HCT: 27 % — ABNORMAL LOW (ref 36.0–46.0)
Hemoglobin: 9.2 g/dL — ABNORMAL LOW (ref 12.0–15.0)
Potassium: 4.5 mmol/L (ref 3.5–5.1)
Sodium: 139 mmol/L (ref 135–145)
TCO2: 23 mmol/L (ref 22–32)

## 2018-04-11 LAB — PREPARE FRESH FROZEN PLASMA
Unit division: 0
Unit division: 0

## 2018-04-11 LAB — BPAM PLATELET PHERESIS
Blood Product Expiration Date: 202001142359
Blood Product Expiration Date: 202001162359
ISSUE DATE / TIME: 202001141556
ISSUE DATE / TIME: 202001141556
Unit Type and Rh: 5100
Unit Type and Rh: 6200

## 2018-04-11 MED ORDER — WARFARIN - PHYSICIAN DOSING INPATIENT
Freq: Every day | Status: DC
  Administered 2018-04-11: 17:00:00

## 2018-04-11 MED ORDER — ASPIRIN EC 81 MG PO TBEC: 81.0000 mg | DELAYED_RELEASE_TABLET | Freq: Every day | ORAL | Status: DC

## 2018-04-11 MED ORDER — WARFARIN SODIUM 2.5 MG PO TABS
2.5000 mg | ORAL_TABLET | Freq: Every day | ORAL | Status: DC
  Administered 2018-04-11 – 2018-04-12 (×2): 2.5 mg via ORAL
  Filled 2018-04-11 (×2): qty 1

## 2018-04-11 MED ORDER — POTASSIUM CHLORIDE CRYS ER 20 MEQ PO TBCR
20.0000 meq | EXTENDED_RELEASE_TABLET | Freq: Once | ORAL | Status: AC
  Administered 2018-04-11: 20 meq via ORAL
  Filled 2018-04-11: qty 1

## 2018-04-11 MED ORDER — ENOXAPARIN SODIUM 30 MG/0.3ML ~~LOC~~ SOLN
30.0000 mg | SUBCUTANEOUS | Status: DC
  Administered 2018-04-12 – 2018-04-14 (×3): 30 mg via SUBCUTANEOUS
  Filled 2018-04-11 (×3): qty 0.3

## 2018-04-11 MED ORDER — FUROSEMIDE 10 MG/ML IJ SOLN
20.0000 mg | Freq: Once | INTRAMUSCULAR | Status: AC
  Administered 2018-04-11: 20 mg via INTRAVENOUS
  Filled 2018-04-11: qty 2

## 2018-04-11 MED ORDER — WARFARIN VIDEO
Freq: Once | Status: AC
  Administered 2018-04-11: 1

## 2018-04-11 MED ORDER — INSULIN ASPART 100 UNIT/ML ~~LOC~~ SOLN
0.0000 [IU] | SUBCUTANEOUS | Status: DC
  Administered 2018-04-11 (×2): 2 [IU] via SUBCUTANEOUS
  Administered 2018-04-11 (×2): 4 [IU] via SUBCUTANEOUS

## 2018-04-11 MED ORDER — PATIENT'S GUIDE TO USING COUMADIN BOOK
Freq: Once | Status: AC
  Administered 2018-04-11: 1
  Filled 2018-04-11: qty 1

## 2018-04-11 NOTE — Progress Notes (Addendum)
[facility] DAILY ICU PROGRESS NOTE                   [address]            [place] [address]          [phone]   1 Day Post-Op Procedure(s) (LRB): MITRAL VALVE REPAIR (MVR) USING 28 MEMO 4D (N/A) CORONARY ARTERY BYPASS GRAFTING (CABG), ON PUMP, TIMES TWO, LIMA TO LAD AND SVG TO RCA USING LEFT INTERNAL MAMMARY ARTERY AND RIGHT GREATER SAPHENOUS VEIN HARVESTED ENDOSCOPICALLY (N/A) TRANSESOPHAGEAL ECHOCARDIOGRAM (TEE) (N/A)  Total Length of Stay:  LOS: 1 day   Subjective: Patient awake and alert. She states she can no longer hear her heartbeat in her ears.  Objective: Vital signs in last 24 hours: Temp:  [94.6 F (34.8 C)-99 F (37.2 C)] 99 F (37.2 C) (01/15 0600) Pulse Rate:  [64-90] 89 (01/15 0600) Cardiac Rhythm: Atrial paced (01/15 0400) Resp:  [0-30] 16 (01/15 0600) BP: (97-133)/(63-92) 100/64 (01/15 0600) SpO2:  [99 %-100 %] 99 % (01/15 0600) Arterial Line BP: (96-133)/(57-81) 117/63 (01/15 0600) FiO2 (%):  [4 %-50 %] 4 % (01/14 1930) Weight:  [80 kg] 80 kg (01/15 0500)  Filed Weights   04/11/18 0500  Weight: 80 kg    Weight change:    Hemodynamic parameters for last 24 hours: PAP: (16-34)/(6-22) 24/10 CO:  [1.8 L/min-4 L/min] 4 L/min CI:  [1 L/min/m2-2.2 L/min/m2] 2.2 L/min/m2  Intake/Output from previous day: 01/14 0701 - 01/15 0700 In: 7658 [P.O.:90; I.V.:4459.5; Blood:1768; IV Piggyback:1340.5] Out: 6160 [Urine:2570; Blood:650; Chest Tube:850]  Intake/Output this shift: No intake/output data recorded.  Current Meds: Scheduled Meds: . acetaminophen  1,000 mg Oral Q6H   Or  . acetaminophen (TYLENOL) oral liquid 160 mg/5 mL  1,000 mg Per Tube Q6H  . aspirin EC  325 mg Oral Daily   Or  . aspirin  324 mg Per Tube Daily  . bisacodyl  10 mg Oral Daily   Or  . bisacodyl  10 mg Rectal Daily  . Chlorhexidine Gluconate Cloth  6 each Topical Daily  . docusate sodium  200 mg Oral Daily  . insulin aspart  0-24 Units Subcutaneous Q4H  .  insulin regular  0-10 Units Intravenous TID WC  . [START ON 04/12/2018] pantoprazole  40 mg Oral Daily  . sodium chloride flush  10-40 mL Intracatheter Q12H  . sodium chloride flush  3 mL Intravenous Q12H   Continuous Infusions: . sodium chloride    . sodium chloride    . sodium chloride 10 mL/hr at 04/10/18 1350  . cefUROXime (ZINACEF)  IV Stopped (04/10/18 2140)  . dexmedetomidine (PRECEDEX) IV infusion Stopped (04/10/18 1810)  . insulin Stopped (04/10/18 1700)  . lactated ringers    . lactated ringers 10 mL/hr at 04/10/18 1348  . lactated ringers 20 mL/hr at 04/11/18 0600  . milrinone 0.2 mcg/kg/min (04/11/18 0600)  . nitroGLYCERIN    . phenylephrine (NEO-SYNEPHRINE) Adult infusion Stopped (04/10/18 1906)   PRN Meds:.sodium chloride, lactated ringers, metoprolol tartrate, midazolam, morphine injection, ondansetron (ZOFRAN) IV, oxyCODONE, sodium chloride flush, sodium chloride flush, traMADol  General appearance: alert, cooperative and no distress Neurologic: intact Heart: RRR, no murmur Lungs: Rub with chest tubes in place. Abdomen: Soft, non tender, sporadic bowel sounds Extremities: Trace LE edema Wound: Aquacel intact;RLE dressing is clean and dry  Lab Results: CBC: Recent Labs    04/10/18 1950 04/10/18 1951 04/11/18 0504  WBC 9.9  --  12.4*  HGB 8.6* 8.2* 8.8*  HCT 26.8* 24.0* 27.1*  PLT 143*  --  157   BMET:  Recent Labs    04/10/18 1951 04/11/18 0504  NA 143 140  K 5.4* 4.1  CL 110 114*  CO2  --  21*  GLUCOSE 165* 144*  BUN 12 11  CREATININE 0.70 0.70  CALCIUM  --  7.8*    CMET: Lab Results  Component Value Date   WBC 12.4 (H) 04/11/2018   HGB 8.8 (L) 04/11/2018   HCT 27.1 (L) 04/11/2018   PLT 157 04/11/2018   GLUCOSE 144 (H) 04/11/2018   CHOL 152 08/16/2017   TRIG 90.0 08/16/2017   HDL 51.30 08/16/2017   LDLCALC 83 08/16/2017   ALT 21 04/06/2018   AST 33 04/06/2018   NA 140 04/11/2018   K 4.1 04/11/2018   CL 114 (H) 04/11/2018    CREATININE 0.70 04/11/2018   BUN 11 04/11/2018   CO2 21 (L) 04/11/2018   TSH 0.47 08/16/2017   INR 1.79 04/10/2018   HGBA1C 5.2 04/06/2018      PT/INR:  Recent Labs    04/10/18 1402  LABPROT 20.5*  INR 1.79   Radiology: Dg Chest Port 1 View  Result Date: 04/10/2018 CLINICAL DATA:  Post mitral valve repair, and CABG EXAM: PORTABLE CHEST 1 VIEW COMPARISON:  Chest x-ray of 04/06/2018 FINDINGS: The tip of the endotracheal tube is approximately 2.8 cm above the carina. Swan-Ganz catheter tip is in the pulmonary outflow tract and NG tube extends below the hemidiaphragm. Mediastinal and chest tubes are present. No pneumothorax is seen. The lungs appear clear. Mild cardiomegaly is stable. Median sternotomy sutures are present. IMPRESSION: 1. Post CABG, endotracheal tube tip is 2.8 cm above the carina. 2. Bilateral chest tubes are present with no pneumothorax. Electronically Signed   By: Ivar Drape M.D.   On: 04/10/2018 14:15     Assessment/Plan: S/P Procedure(s) (LRB): MITRAL VALVE REPAIR (MVR) USING 28 MEMO 4D (N/A) CORONARY ARTERY BYPASS GRAFTING (CABG), ON PUMP, TIMES TWO, LIMA TO LAD AND SVG TO RCA USING LEFT INTERNAL MAMMARY ARTERY AND RIGHT GREATER SAPHENOUS VEIN HARVESTED ENDOSCOPICALLY (N/A) TRANSESOPHAGEAL ECHOCARDIOGRAM (TEE) (N/A)   1. CV-SR in the 70-80s. CO/CI 4/2.2. On Milrinone drip at 0.25. Hope to wean off Milrinone dripl. 2. Pulmonary-on 2 liters of oxygen via [place]. Chest tubes with 850 cc. CXR this am appears to show patient rotated to the right, trace right apical pneumothorax, bibasilar atelectasis, and gas. Chest tubes to remain for now. Encourage incentive spirometer.  3. Volume overload-will give 20 mg IV Lasix this am 4. ABL anemia- H and H this am 8.8 and 27.1 5. Please see progression orders    Donielle Liston Alba PA-C 04/11/2018 7:15 AM    I have seen and examined the patient and agree with the assessment and plan as outlined.  Doing very well and  maintaining NSR w/ stable hemodynamics off all drips except milrinone 0.2.  Routine postop.  Start Coumadin slowly.  Rexene Alberts, MD 04/11/2018 8:59 AM

## 2018-04-11 NOTE — Progress Notes (Signed)
      [address]       [place] [address]             [phone]      POD # 1 mitral repair, CABG x 2  Resting comfortably  BP 106/67   Pulse 86   Temp 99 F (37.2 C) (Oral)   Resp (!) 28   Wt 80 kg   SpO2 97%   BMI 29.35 kg/m  on 3L [place]  Intake/Output Summary (Last 24 hours) at 04/11/2018 1808 Last data filed at 04/11/2018 1700 Gross per 24 hour  Intake 1652.84 ml  Output 1690 ml  Net -37.16 ml   Hct=26, K= 4.5  Doing well  Remo Lipps C. Roxan Hockey, MD [facility] [phone]

## 2018-04-11 NOTE — Discharge Summary (Signed)
Physician Discharge Summary       [address]       ,[place] [address]             [phone]    Patient ID: Tami Wood MRN: [id number] DOB/AGE: Apr 25, 1950 68 y.o.  Admit date: 04/10/2018 Discharge date: 04/17/2018  Admission Diagnoses: 1. Severe multivessel coronary artery disease 2. Severe mitral regurgitation  Discharge Diagnoses:  1. S/P mitral valve repair + CABG x2 2. History of iron deficiency anemia 3. History of essential hypertension 4. History of depression 5. History of GERD 6. History of peptic ulcer disease  Procedure (s):   Mitral Valve Repair             Complex valvuloplasty including artificial Gore-tex neochord placement x8             Sorin Memo 4D ring annuloplasty (size 42mm, catalog #[id number], serial [id number])   Coronary Artery Bypass Grafting x 2              Left Internal Mammary Artery to Distal Left Anterior Descending Coronary Artery             Saphenous Vein Graft to Posterior Descending Coronary Artery             Endoscopic Vein Harvest from Right Thigh by Dr. Roxy Manns on 04/10/2018.  History of Presenting Illness: Patient is a 68 year old African-American female with long-standing history of heart murmur and essential hypertension who has been referred for surgical consultation to discuss treatment options for management of mitral valve prolapse with severe symptomatic primary mitral regurgitation and multivessel coronary artery disease.  Patient states that she has been told that she had a heart murmur all of her life. However, she has never been formally evaluated by a cardiologist until recently. She recently had some dental work done and her dentist insisted that she see a cardiologist to make certain that she was being treated appropriately for an endocarditis prophylaxis. She has recently developed symptoms of exertional shortness of breath, although they occur only with more strenuous physical exertion such as going  up and down a flight of stairs. She was evaluated by Dr. Lavone Orn routine transthoracic echocardiogram revealed normal left ventricular size and systolic function with severe prolapse involving the posterior leaflet of the mitral valve and severe mitral regurgitation. Pulmonary artery pressures were mildly elevated. Ejection fraction was estimated 55%. The patient subsequently underwent transesophageal echocardiogram and diagnostic cardiac catheterization on February 06, 2018. 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 There was mild left atrial enlargement. Ejection fraction was estimated 55 to 60%. Left and right heart catheterization revealed multivessel coronary artery disease including 80% stenosis of the proximal left anterior descending coronary artery and 70% stenosis of the distal right coronary artery. Pulmonary artery pressures were minimally elevated. There were large V waves on wedge tracing consistent with severe mitral regurgitation. Cardiothoracic surgical consultation was requested.  Patient is single and lives alone locally in [place]. She has 3 sisters who she is very close with, all of whom accompany her for her office consultation visit today. 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 She drives an automobile and remains entirely functionally independent. She does not exercise on a regular basis. She states that recently she has developed shortness of  breath with exertion, such as walking up a flight of stairs. She complains that she gets tired more easily than she used to. She denies any history of chest pain or chest tightness either with activity or at rest. She denies any history of resting shortness of breath,  PND, orthopnea, or lower extremity edema. She has not had tachypalpitations, dizzy spells, nor syncope.  Patient is a 68 year old African-American female with long-standing history of heart murmur and essential hypertension whoreturns to the office today with tentative plans to proceed with elective mitral valve repair and CABG tomorrow.She was originally seen in consultation on March 05, 2018. She reports no new problems or complaints. She states that she still gets mild shortness of breath with exertion and she tires easily. Symptoms have not progressed to any significant degree. She denies any history of chest pain or chest tightness. She has not had any recent fevers, chills, or productive cough. Patient has mitral valve prolapse with stage D severe symptomatic primary mitral regurgitation and multivessel coronary artery disease without angina pectoris. She describes recent onset and progression of symptoms of exertional shortness of breath and fatigue consistent with chronic diastolic congestive heart failure, New York Heart Association functional class II. Diagnostic cardiac catheterization revealed severe multivessel coronary artery disease including high-grade 80% stenosis of the proximal left anterior descending coronary artery arising at takeoff of a medium-sized diagonal branch. There is 70% stenosis of the distal right coronary artery and right dominant coronary circulation.  The patientandone ofher sisters were againcounseled at length regarding the indications, risks and potential benefits of mitral valve repair and coronary artery bypass grafting. The rationale for elective surgery has been explained, including a comparison between surgery and continued medical therapy with close follow-up. The likelihood of successful and durable valve repair has been discussed with particular reference to the findings of their recent echocardiogram. Based upon these findings and previous  experience, I have quoted them a greater than 95percent likelihood of successful valve repair. Alternative treatment strategies were discussed. Expectations for the patient's postoperative convalescence were discussed. The patient understands and accepts all potential risks of surgery. Patient underwent a CABG x 2 and mitral valve repair on 04/10/2018.  Brief Hospital Course:  The patient was extubated late the evening of surgery without difficulty. She remained afebrile and hemodynamically stable. Gordy Councilman, a line, chest tubes, and foley were removed early in the post operative course. She was started on Coumadin the evening of 01/15. Daily PT and INR were obtained. Lopressor was started on 01/16.  She was volume over loaded and diuresed. She had ABL anemia. She did not require a post op transfusion. Last H and H was 7.4/23.6.  She did have nausea early in her post operative course. Zofran and Reglan did help resolve this. She was weaned off the insulin drip.  The patient's HGA1C pre op was  5.2. The patient was felt surgically stable for transfer from the ICU to [facility] for further convalescence on 04/12/2018. She continues to progress with cardiac rehab. She was ambulating on room air. She has been tolerating a diet and has had a bowel movement. Epicardial pacing wires were removed on 04/14/2018. Chest tube sutures will be removed the day of discharge. The patient is felt surgically stable for discharge today.    Latest Vital Signs: Blood pressure (!) 141/89, pulse 83, temperature 98.2 F (36.8 C), temperature source Oral, resp. rate (!) 28, height 5\' 5"  (1.651 m), weight 80.8 kg, SpO2 98 %.  Physical Exam:  General appearance: alert, cooperative and no distress Heart: regular rate and rhythm and occ extrasystole Lungs: min dim in bases Abdomen: benign Extremities: no edema Wound: incis healing well  Discharge Condition: Stable and discharged to home.  Recent laboratory studies:  Lab  Results  Component Value Date   WBC 12.0 (H) 04/14/2018   HGB 7.4 (L) 04/14/2018   HCT 23.6 (L) 04/14/2018   MCV 93.3 04/14/2018   PLT 100 (L) 04/14/2018   Lab Results  Component Value Date   NA 135 04/12/2018   K 4.5 04/12/2018   CL 107 04/12/2018   CO2 25 04/12/2018   CREATININE 0.87 04/12/2018   GLUCOSE 117 (H) 04/12/2018      Diagnostic Studies: Dg Chest 2 View  Result Date: 04/13/2018 CLINICAL DATA:  Follow-up atelectasis EXAM: CHEST - 2 VIEW COMPARISON:  04/12/2018 FINDINGS: Cardiac shadow is enlarged but stable. Postsurgical changes are again seen. Bilateral thoracostomy catheters, mediastinal drain and pericardial drain have been removed in the interval. No pneumothorax is seen. The overall inspiratory effort is poor with bibasilar atelectatic changes. No bony abnormality is noted. IMPRESSION: Bibasilar atelectasis somewhat improved from the prior exam. Interval tube removal without pneumothorax. Electronically Signed   By: Inez Catalina M.D.   On: 04/13/2018 07:55   Dg Chest 2 View  Result Date: 04/06/2018 CLINICAL DATA:  Preop for cardiac surgery EXAM: CHEST - 2 VIEW COMPARISON:  None. FINDINGS: Mild cardiomegaly. Normal vascularity. Calcified granulomata in the left upper lobe have a benign appearance. Lungs are otherwise clear. No pneumothorax or pleural effusion. IMPRESSION: Cardiomegaly without decompensation. Electronically Signed   By: Marybelle Killings M.D.   On: 04/06/2018 13:19   Dg Chest Port 1 View  Result Date: 04/12/2018 CLINICAL DATA:  Shortness of breath. Pleural effusion. Postop from CABG and mitral valve replacement. EXAM: PORTABLE CHEST 1 VIEW COMPARISON:  04/11/2018 FINDINGS: Swan-Ganz catheter has been removed. Bilateral chest tubes and mediastinal drain remain in place. A tiny less than 5% left apical pneumothorax is seen. Heart size is stable. Diffuse interstitial prominence is suspicious for mild interstitial edema. Mild increase in bilateral pleural effusions  and bibasilar atelectasis. IMPRESSION: 1. Mild increase in bilateral pleural effusions and bibasilar atelectasis. 2. Tiny less than 5% left apical pneumothorax. 3. Diffuse interstitial prominence, suspicious for mild interstitial edema. Electronically Signed   By: Earle Gell M.D.   On: 04/12/2018 08:08   Dg Chest Port 1 View  Result Date: 04/11/2018 CLINICAL DATA:  Status postextubation EXAM: PORTABLE CHEST 1 VIEW COMPARISON:  April 10, 2018 FINDINGS: Endotracheal tube and nasogastric tube have been removed. Chest tubes and mediastinal drain remain in place. Swan-Ganz catheter tip is in the main pulmonary outflow tract. Temporary pacemaker wires are attached to the right heart. No evident pneumothorax. There is a new right pleural effusion with a smaller left pleural effusion. There is atelectatic change in both lower lobes. Heart is upper normal in size with pulmonary vascularity normal. No adenopathy. No bone lesions. IMPRESSION: Tube and catheter positions as described. No pneumothorax. Pleural effusions bilaterally, new, larger on the right than on the left. Atelectatic change in both lower lobes. Stable cardiac silhouette. Electronically Signed   By: Lowella Grip III M.D.   On: 04/11/2018 08:16   Dg Chest Port 1 View  Result Date: 04/10/2018 CLINICAL DATA:  Post mitral valve repair, and CABG EXAM: PORTABLE CHEST 1 VIEW COMPARISON:  Chest x-ray of 04/06/2018 FINDINGS: The tip of the endotracheal tube is approximately 2.8 cm above the  carina. Swan-Ganz catheter tip is in the pulmonary outflow tract and NG tube extends below the hemidiaphragm. Mediastinal and chest tubes are present. No pneumothorax is seen. The lungs appear clear. Mild cardiomegaly is stable. Median sternotomy sutures are present. IMPRESSION: 1. Post CABG, endotracheal tube tip is 2.8 cm above the carina. 2. Bilateral chest tubes are present with no pneumothorax. Electronically Signed   By: Ivar Drape M.D.   On: 04/10/2018 14:15     Vas US Doppler Pre Cabg  Result Date: 04/06/2018 PREOPERATIVE VASCULAR EVALUATION  Indications:      Pre MVR evaluation. Comparison Study: No prior study available Performing Technologist: Rudell Cobb RDMS RVT  Examination Guidelines: A complete evaluation includes B-mode imaging, spectral Doppler, color Doppler, and power Doppler as needed of all accessible portions of each vessel. Bilateral testing is considered an integral part of a complete examination. Limited examinations for reoccurring indications may be performed as noted.  Right Carotid Findings: +----------+--------+--------+--------+--------+--------+           PSV cm/sEDV cm/sStenosisDescribeComments +----------+--------+--------+--------+--------+--------+ CCA Prox  75      19                               +----------+--------+--------+--------+--------+--------+ CCA Distal58      21                               +----------+--------+--------+--------+--------+--------+ ICA Prox  77      34      1-39%           tortuous +----------+--------+--------+--------+--------+--------+ ICA Mid   87      41                               +----------+--------+--------+--------+--------+--------+ ICA Distal81      29                               +----------+--------+--------+--------+--------+--------+ ECA       86      17                               +----------+--------+--------+--------+--------+--------+ Portions of this table do not appear on this page. +----------+--------+-------+--------+------------+           PSV cm/sEDV cmsDescribeArm Pressure +----------+--------+-------+--------+------------+ Subclavian49                     154          +----------+--------+-------+--------+------------+ +---------+--------+--+--------+--+---------------+ VertebralPSV cm/s26EDV cm/s11Bi- directional +---------+--------+--+--------+--+---------------+ Left Carotid Findings:  +----------+--------+--------+--------+--------+--------+           PSV cm/sEDV cm/sStenosisDescribeComments +----------+--------+--------+--------+--------+--------+ CCA Prox  90      27                               +----------+--------+--------+--------+--------+--------+ CCA Mid   94      24                               +----------+--------+--------+--------+--------+--------+ CCA Distal63      18                               +----------+--------+--------+--------+--------+--------+  ICA Prox  87      33      1-39%           tortuous +----------+--------+--------+--------+--------+--------+ ICA Mid   79      31                               +----------+--------+--------+--------+--------+--------+ ICA Distal68      30                               +----------+--------+--------+--------+--------+--------+ ECA       52      10                               +----------+--------+--------+--------+--------+--------+ +----------+--------+--------+--------+------------+ SubclavianPSV cm/sEDV cm/sDescribeArm Pressure +----------+--------+--------+--------+------------+           96                      152          +----------+--------+--------+--------+------------+ +---------+--------+--+--------+--+---------+ VertebralPSV cm/s39EDV cm/s15Antegrade +---------+--------+--+--------+--+---------+  ABI Findings: +--------+------------------+-----+---------+--------+ Right   Rt Pressure (mmHg)IndexWaveform Comment  +--------+------------------+-----+---------+--------+ [id number]                    triphasic         +--------+------------------+-----+---------+--------+ +--------+------------------+-----+---------+-------+ Left    Lt Pressure (mmHg)IndexWaveform Comment +--------+------------------+-----+---------+-------+ [id number]                    triphasic        +--------+------------------+-----+---------+-------+   Right Doppler Findings: +--------+--------+-----+---------+--------+ Site    PressureIndexDoppler  Comments +--------+--------+-----+---------+--------+ [id number]          triphasic         +--------+--------+-----+---------+--------+ Forearm              triphasic         +--------+--------+-----+---------+--------+ Radial               triphasic         +--------+--------+-----+---------+--------+  Left Doppler Findings: +--------+--------+-----+---------+--------+ Site    PressureIndexDoppler  Comments +--------+--------+-----+---------+--------+ [id number]          triphasic         +--------+--------+-----+---------+--------+ Radial               triphasic         +--------+--------+-----+---------+--------+ Ulnar                triphasic         +--------+--------+-----+---------+--------+  Summary: Right Carotid: Velocities in the right ICA are consistent with a 1-39% stenosis. Left Carotid: Velocities in the left ICA are consistent with a 1-39% stenosis. Vertebrals: Left vertebral artery demonstrates antegrade flow. Right vertebral             artery demonstrates bidirectional flow. Right Upper Extremity: Doppler waveforms remain within normal limits with right radial compression. Doppler waveforms remain within normal limits with right ulnar compression. Left Upper Extremity: Doppler waveforms remain within normal limits with left radial compression. Doppler waveform obliterate with left ulnar compression.  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 Etiology unknown. Further evaluation needed if clinical concerns. Electronically signed by Monica Martinez MD on 04/06/2018 at 12:26:53 PM.    Final    Discharge Instructions    Amb Referral to Cardiac  Rehabilitation   Complete by:  As directed    Diagnosis:   CABG Valve Repair     Valve:  Mitral   CABG X ___:  2   Discharge patient    Complete by:  As directed    Discharge disposition:  01-Home or Self Care   Discharge patient date:  04/15/2018      Discharge Medications: Allergies as of 04/15/2018   No Known Allergies     Medication List    STOP taking these medications   naproxen sodium 220 MG tablet Commonly known as:  ALEVE     TAKE these medications   amoxicillin 500 MG capsule Commonly known as:  AMOXIL Take 2 capsules (1,000 mg total) by mouth 2 (two) times daily. What changed:    how much to take  when to take this  additional instructions   aspirin 81 MG EC tablet Take 81 mg by mouth daily.   atorvastatin 80 MG tablet Commonly known as:  LIPITOR Take 1 tablet (80 mg total) by mouth daily at 6 PM.   folic acid-pyridoxine-cyancobalamin 2.5-25-2 MG Tabs tablet Commonly known as:  FOLTX Take 1 tablet by mouth daily.   hydrochlorothiazide 12.5 MG capsule Commonly known as:  MICROZIDE Take 1 capsule (12.5 mg total) by mouth daily.   lansoprazole 15 MG capsule Commonly known as:  PREVACID Take 15 mg by mouth daily as needed (heartburn).   losartan 50 MG tablet Commonly known as:  COZAAR Take 1 tablet (50 mg total) by mouth daily.   metoprolol tartrate 25 MG tablet Commonly known as:  LOPRESSOR Take 1 tablet (25 mg total) by mouth 2 (two) times daily.   multivitamin with minerals tablet Take 1 tablet by mouth daily.   oxyCODONE 5 MG immediate release tablet Commonly known as:  Oxy IR/ROXICODONE Take 1-2 tablets (5-10 mg total) by mouth every 3 (three) hours as needed for up to 7 days for severe pain.   TURMERIC PO Take 1,000 mg by mouth daily as needed (pain).   warfarin 1 MG tablet Commonly known as:  COUMADIN Take 2.5 tablets (2.5 mg total) by mouth daily at 6 PM. As directed by coumadin clinic      The patient has been discharged on:   1.Beta Blocker:  Yes [ x  ]                              No   [   ]                              If No, reason:  2.Ace  Inhibitor/ARB: Yes [  Y ]                                     No  [    ]                                     If No, reason:  3.Statin:   Yes [   ]                  No  [ x  ]  If No, reason:No CAD  4.Shela Commons:  Yes  [ x  ]                  No   [   ]                  If No, reason:  Follow Up Appointments: Follow-up Information    Nigel Mormon, MD. Go on 04/18/2018.   Specialty:  Cardiology Why:  Appointment time is at 10:00 am and is for PT and INR (on Coumadin for mitral valve repair, goal is 2-2.5). She has a routine follow up appointment on February 10th at 11:30 am as well Contact information: [place] [address] [phone]        Rexene Alberts, MD. Go on 05/14/2018.   Specialty:  Cardiothoracic Surgery Why:  PA/LAT CXR to be taken (at [facility] which is in the same building as Dr. Guy Sandifer office on 02/17 at 1:00 pm;Appointment time is at 1:30 pm Contact information: [place] [address] [phone]           Signed: Gaspar Bidding 04/17/2018, 10:30 AM

## 2018-04-12 ENCOUNTER — Inpatient Hospital Stay (HOSPITAL_COMMUNITY): Payer: Medicare Other

## 2018-04-12 LAB — PROTIME-INR
INR: 1.48
Prothrombin Time: 17.8 seconds — ABNORMAL HIGH (ref 11.4–15.2)

## 2018-04-12 LAB — BASIC METABOLIC PANEL
Anion gap: 3 — ABNORMAL LOW (ref 5–15)
BUN: 16 mg/dL (ref 8–23)
CO2: 25 mmol/L (ref 22–32)
Calcium: 8 mg/dL — ABNORMAL LOW (ref 8.9–10.3)
Chloride: 107 mmol/L (ref 98–111)
Creatinine, Ser: 0.87 mg/dL (ref 0.44–1.00)
GFR calc Af Amer: >60 mL/min (ref >60)
GFR calc non Af Amer: >60 mL/min (ref >60)
Glucose, Bld: 117 mg/dL — ABNORMAL HIGH (ref 70–99)
POTASSIUM: 4.5 mmol/L (ref 3.5–5.1)
Sodium: 135 mmol/L (ref 135–145)

## 2018-04-12 LAB — CBC
HCT: 25.6 % — ABNORMAL LOW (ref 36.0–46.0)
Hemoglobin: 8.4 g/dL — ABNORMAL LOW (ref 12.0–15.0)
MCH: 30.7 pg (ref 26.0–34.0)
MCHC: 32.8 g/dL (ref 30.0–36.0)
MCV: 93.4 fL (ref 80.0–100.0)
Platelets: 108 10*3/uL — ABNORMAL LOW (ref 150–400)
RBC: 2.74 MIL/uL — AB (ref 3.87–5.11)
RDW: 14.4 % (ref 11.5–15.5)
WBC: 16.4 10*3/uL — ABNORMAL HIGH (ref 4.0–10.5)
nRBC: 0 % (ref 0.0–0.2)

## 2018-04-12 LAB — GLUCOSE, CAPILLARY
Glucose-Capillary: 109 mg/dL — ABNORMAL HIGH (ref 70–99)
Glucose-Capillary: 136 mg/dL — ABNORMAL HIGH (ref 70–99)

## 2018-04-12 MED ORDER — FUROSEMIDE 10 MG/ML IJ SOLN
20.0000 mg | Freq: Four times a day (QID) | INTRAMUSCULAR | Status: AC
  Administered 2018-04-12 (×3): 20 mg via INTRAVENOUS
  Filled 2018-04-12 (×3): qty 2

## 2018-04-12 MED ORDER — METOCLOPRAMIDE HCL 5 MG/5ML PO SOLN
10.0000 mg | Freq: Four times a day (QID) | ORAL | Status: AC
  Administered 2018-04-12 – 2018-04-13 (×2): 10 mg via ORAL
  Filled 2018-04-12 (×2): qty 10

## 2018-04-12 MED ORDER — ASPIRIN EC 81 MG PO TBEC
81.0000 mg | DELAYED_RELEASE_TABLET | Freq: Every day | ORAL | Status: DC
  Administered 2018-04-12 – 2018-04-15 (×4): 81 mg via ORAL
  Filled 2018-04-12 (×4): qty 1

## 2018-04-12 MED ORDER — METOCLOPRAMIDE HCL 5 MG/5ML PO SOLN
10.0000 mg | Freq: Four times a day (QID) | ORAL | Status: DC
  Administered 2018-04-12 (×2): 10 mg via ORAL
  Filled 2018-04-12 (×4): qty 10

## 2018-04-12 MED ORDER — METOPROLOL TARTRATE 12.5 MG HALF TABLET
12.5000 mg | ORAL_TABLET | Freq: Two times a day (BID) | ORAL | Status: DC
  Administered 2018-04-12 – 2018-04-13 (×4): 12.5 mg via ORAL
  Filled 2018-04-12 (×4): qty 1

## 2018-04-12 MED ORDER — ASPIRIN 81 MG PO TBEC: 81.0000 mg | DELAYED_RELEASE_TABLET | Freq: Every day | ORAL | Status: DC
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
## 2018-04-13 ENCOUNTER — Inpatient Hospital Stay (HOSPITAL_COMMUNITY): Payer: Medicare Other

## 2018-04-13 LAB — CBC
HCT: 24 % — ABNORMAL LOW (ref 36.0–46.0)
Hemoglobin: 8 g/dL — ABNORMAL LOW (ref 12.0–15.0)
MCH: 30.9 pg (ref 26.0–34.0)
MCHC: 33.3 g/dL (ref 30.0–36.0)
MCV: 92.7 fL (ref 80.0–100.0)
Platelets: 91 10*3/uL — ABNORMAL LOW (ref 150–400)
RBC: 2.59 MIL/uL — ABNORMAL LOW (ref 3.87–5.11)
RDW: 14.3 % (ref 11.5–15.5)
WBC: 17.4 10*3/uL — ABNORMAL HIGH (ref 4.0–10.5)
nRBC: 0 % (ref 0.0–0.2)

## 2018-04-13 LAB — PROTIME-INR
INR: 1.39
Prothrombin Time: 16.9 seconds — ABNORMAL HIGH (ref 11.4–15.2)

## 2018-04-13 MED ORDER — FUROSEMIDE 40 MG PO TABS
40.0000 mg | ORAL_TABLET | Freq: Every day | ORAL | Status: AC
  Administered 2018-04-13 – 2018-04-15 (×3): 40 mg via ORAL
  Filled 2018-04-13 (×3): qty 1

## 2018-04-13 MED ORDER — POTASSIUM CHLORIDE CRYS ER 20 MEQ PO TBCR
20.0000 meq | EXTENDED_RELEASE_TABLET | Freq: Every day | ORAL | Status: AC
  Administered 2018-04-13 – 2018-04-15 (×3): 20 meq via ORAL
  Filled 2018-04-13 (×3): qty 1

## 2018-04-13 MED ORDER — FA-PYRIDOXINE-CYANOCOBALAMIN 2.5-25-2 MG PO TABS
1.0000 | ORAL_TABLET | Freq: Every day | ORAL | Status: DC
  Administered 2018-04-14 – 2018-04-15 (×2): 1 via ORAL
  Filled 2018-04-13 (×2): qty 1

## 2018-04-13 MED ORDER — ATORVASTATIN CALCIUM 80 MG PO TABS
80.0000 mg | ORAL_TABLET | Freq: Every day | ORAL | Status: DC
  Administered 2018-04-14: 80 mg via ORAL
  Filled 2018-04-13: qty 1

## 2018-04-13 MED ORDER — WARFARIN SODIUM 4 MG PO TABS
4.0000 mg | ORAL_TABLET | Freq: Every day | ORAL | Status: DC
  Administered 2018-04-13 – 2018-04-14 (×2): 4 mg via ORAL
  Filled 2018-04-13 (×2): qty 1

## 2018-04-13 MED FILL — Magnesium Sulfate Inj 50%: INTRAMUSCULAR | Qty: 10 | Status: AC

## 2018-04-13 MED FILL — Potassium Chloride Inj 2 mEq/ML: INTRAVENOUS | Qty: 40 | Status: AC

## 2018-04-13 MED FILL — Heparin Sodium (Porcine) Inj 1000 Unit/ML: INTRAMUSCULAR | Qty: 30 | Status: AC

## 2018-04-13 MED FILL — Dexmedetomidine HCl in NaCl 0.9% IV Soln 400 MCG/100ML: INTRAVENOUS | Qty: 100 | Status: AC

## 2018-04-13 NOTE — Progress Notes (Addendum)
[address]       [place] [address]             [phone]      3 Days Post-Op Procedure(s) (LRB): MITRAL VALVE REPAIR (MVR) USING 28 MEMO 4D (N/A) CORONARY ARTERY BYPASS GRAFTING (CABG), ON PUMP, TIMES TWO, LIMA TO LAD AND SVG TO RCA USING LEFT INTERNAL MAMMARY ARTERY AND RIGHT GREATER SAPHENOUS VEIN HARVESTED ENDOSCOPICALLY (N/A) TRANSESOPHAGEAL ECHOCARDIOGRAM (TEE) (N/A) Subjective: conts to feel better overall  Objective: Vital signs in last 24 hours: Temp:  [98.2 F (36.8 C)-99.2 F (37.3 C)] 98.6 F (37 C) (01/17 0730) Pulse Rate:  [79-93] 84 (01/17 0730) Cardiac Rhythm: Normal sinus rhythm (01/17 0700) Resp:  [14-22] 17 (01/17 0730) BP: (105-131)/(68-86) 130/85 (01/17 0730) SpO2:  [86 %-99 %] 98 % (01/17 0730) Weight:  [83.7 kg] 83.7 kg (01/17 0304)  Hemodynamic parameters for last 24 hours:    Intake/Output from previous day: 01/16 0701 - 01/17 0700 In: 716.5 [P.O.:590; I.V.:26.5; IV Piggyback:100] Out: 1026 [Urine:726; Stool:300] Intake/Output this shift: No intake/output data recorded.  General appearance: alert, cooperative and no distress Heart: regular rate and rhythm, no rub and no murmur Lungs: dimin in lower fields Abdomen: soft, nontender Extremities: no edema Wound: incis healing well  Lab Results: Recent Labs    04/12/18 0415 04/13/18 0254  WBC 16.4* 17.4*  HGB 8.4* 8.0*  HCT 25.6* 24.0*  PLT 108* 91*   BMET:  Recent Labs    04/11/18 0504  04/11/18 1646 04/12/18 0415  NA 140  --  139 135  K 4.1  --  4.5 4.5  CL 114*  --  105 107  CO2 21*  --   --  25  GLUCOSE 144*  --  171* 117*  BUN 11  --  17 16  CREATININE 0.70   < > 0.80 0.87  CALCIUM 7.8*  --   --  8.0*   < > = values in this interval not displayed.    PT/INR:  Recent Labs    04/13/18 0254  LABPROT 16.9*  INR 1.39   ABG    Component Value Date/Time   PHART 7.319 (L) 04/10/2018 2121   HCO3 23.8 04/10/2018 2121   TCO2 23 04/11/2018 1646     ACIDBASEDEF 2.0 04/10/2018 2121   O2SAT 99.0 04/10/2018 2121   CBG (last 3)  Recent Labs    04/11/18 2329 04/12/18 0316 04/12/18 0717  GLUCAP 130* 109* 136*    Meds Scheduled Meds: . acetaminophen  1,000 mg Oral Q6H  . aspirin EC  81 mg Oral Daily  . [START ON 04/14/2018] atorvastatin  80 mg Oral q1800  . bisacodyl  10 mg Oral Daily   Or  . bisacodyl  10 mg Rectal Daily  . Chlorhexidine Gluconate Cloth  6 each Topical Daily  . docusate sodium  200 mg Oral Daily  . enoxaparin (LOVENOX) injection  30 mg Subcutaneous Q24H  . furosemide  40 mg Oral Daily  . metoprolol tartrate  12.5 mg Oral BID  . pantoprazole  40 mg Oral Daily  . potassium chloride  20 mEq Oral Q breakfast  . sodium chloride flush  10-40 mL Intracatheter Q12H  . sodium chloride flush  3 mL Intravenous Q12H  . warfarin  2.5 mg Oral q1800  . Warfarin - Physician Dosing Inpatient   Does not apply q1800   Continuous Infusions: . sodium chloride    . lactated ringers 10 mL/hr  at 04/10/18 1348   PRN Meds:.metoprolol tartrate, morphine injection, ondansetron (ZOFRAN) IV, oxyCODONE, sodium chloride flush, sodium chloride flush, traMADol  Xrays Dg Chest 2 View  Result Date: 04/13/2018 CLINICAL DATA:  Follow-up atelectasis EXAM: CHEST - 2 VIEW COMPARISON:  04/12/2018 FINDINGS: Cardiac shadow is enlarged but stable. Postsurgical changes are again seen. Bilateral thoracostomy catheters, mediastinal drain and pericardial drain have been removed in the interval. No pneumothorax is seen. The overall inspiratory effort is poor with bibasilar atelectatic changes. No bony abnormality is noted. IMPRESSION: Bibasilar atelectasis somewhat improved from the prior exam. Interval tube removal without pneumothorax. Electronically Signed   By: Inez Catalina M.D.   On: 04/13/2018 07:55   Dg Chest Port 1 View  Result Date: 04/12/2018 CLINICAL DATA:  Shortness of breath. Pleural effusion. Postop from CABG and mitral valve replacement.  EXAM: PORTABLE CHEST 1 VIEW COMPARISON:  04/11/2018 FINDINGS: Swan-Ganz catheter has been removed. Bilateral chest tubes and mediastinal drain remain in place. A tiny less than 5% left apical pneumothorax is seen. Heart size is stable. Diffuse interstitial prominence is suspicious for mild interstitial edema. Mild increase in bilateral pleural effusions and bibasilar atelectasis. IMPRESSION: 1. Mild increase in bilateral pleural effusions and bibasilar atelectasis. 2. Tiny less than 5% left apical pneumothorax. 3. Diffuse interstitial prominence, suspicious for mild interstitial edema. Electronically Signed   By: Earle Gell M.D.   On: 04/12/2018 08:08    Assessment/Plan: S/P Procedure(s) (LRB): MITRAL VALVE REPAIR (MVR) USING 28 MEMO 4D (N/A) CORONARY ARTERY BYPASS GRAFTING (CABG), ON PUMP, TIMES TWO, LIMA TO LAD AND SVG TO RCA USING LEFT INTERNAL MAMMARY ARTERY AND RIGHT GREATER SAPHENOUS VEIN HARVESTED ENDOSCOPICALLY (N/A) TRANSESOPHAGEAL ECHOCARDIOGRAM (TEE) (N/A)  1 steady overall progress POD # 3 2 hemodyn stable in sinus rhythm 3 cont to wean O2/pulm toilet for atx 4 volume status improving-lasix to 40 mg po daily 5 no fevers, leukocytosis trend slightly up, monitor clinically  6 INR lower - 1.39, will increase coumadin dose 7 H/H lower to 8/24- follow clinically as is close to transfusion threshold 8 BS controlled 9 thrombocytopenia a little worse, platelets 91K- monitor , poss stop lovenox  10 routine rehab   LOS: 3 days    John Giovanni PA-C  04/13/2018 Pager [phone]  I have seen and examined the patient and agree with the assessment and plan as outlined.  Overall doing very well.  Watch platelet count and stop lovenox if it drops further.  Mobilize.  Diuresis.  Coumadin.  Consider restarting ARB if BP trends up any further.  Possible d/c home 2-3 days.  Rexene Alberts, MD 04/13/2018 11:41 AM

## 2018-04-13 NOTE — Anesthesia Postprocedure Evaluation (Signed)
Anesthesia Post Note  Patient: Tami Wood  Procedure(s) Performed: MITRAL VALVE REPAIR (MVR) USING 28 MEMO 4D (N/A Chest) CORONARY ARTERY BYPASS GRAFTING (CABG), ON PUMP, TIMES TWO, LIMA TO LAD AND SVG TO RCA USING LEFT INTERNAL MAMMARY ARTERY AND RIGHT GREATER SAPHENOUS VEIN HARVESTED ENDOSCOPICALLY (N/A Chest) TRANSESOPHAGEAL ECHOCARDIOGRAM (TEE) (N/A )     Patient location during evaluation: SICU Anesthesia Type: General Level of consciousness: sedated Pain management: pain level controlled Vital Signs Assessment: post-procedure vital signs reviewed and stable Respiratory status: patient remains intubated per anesthesia plan Cardiovascular status: stable Postop Assessment: no apparent nausea or vomiting Anesthetic complications: no    Last Vitals:  Vitals:   04/13/18 1100 04/13/18 1415  BP: 123/81 138/79  Pulse: 81   Resp: 20 20  Temp: 36.7 C   SpO2: 100% 97%    Last Pain:  Vitals:   04/13/18 1100  TempSrc: Oral  PainSc: 0-No pain                 Donold Marotto S

## 2018-04-13 NOTE — Care Management Important Message (Signed)
Important Message  Patient Details  Name: Tami Wood MRN: [id number] Date of Birth: [birth year]   Medicare Important Message Given:  Yes    Barb Merino Skie Vitrano 04/13/2018, 4:59 PM

## 2018-04-13 NOTE — Progress Notes (Signed)
CARDIAC REHAB PHASE I   PRE:  Rate/Rhythm: 82 SR  BP:  Supine:   Sitting: 135/85  Standing:    SaO2: 97 % 2L     97%RA  MODE:  Ambulation: 300 ft   POST:  Rate/Rhythm: 95 SR  BP:  Supine:   Sitting: 138/79  Standing:    SaO2: 96%RA 1340-1440 Left pt off oxygen and maintained above 94%. Pt walked 300 ft on RA with rolling walker and asst x 1. Got a little close to objects on left of hall. Stopped once to rest. To bed after walk. Family in room. Left off oxygen as sats good on RA. Discussed staying in the tube, IS,flutter valve, showering , keeping feet up, CRP 2. Referred to [place] CRP 2. Will complete ed next visit. Pt has rollator and rolling walker at home if needed. Going to stay with her sister.   Graylon Good, RN BSN  04/13/2018 2:30 PM

## 2018-04-14 LAB — CBC
HCT: 23.6 % — ABNORMAL LOW (ref 36.0–46.0)
Hemoglobin: 7.4 g/dL — ABNORMAL LOW (ref 12.0–15.0)
MCH: 29.2 pg (ref 26.0–34.0)
MCHC: 31.4 g/dL (ref 30.0–36.0)
MCV: 93.3 fL (ref 80.0–100.0)
Platelets: 100 10*3/uL — ABNORMAL LOW (ref 150–400)
RBC: 2.53 MIL/uL — ABNORMAL LOW (ref 3.87–5.11)
RDW: 14.2 % (ref 11.5–15.5)
WBC: 12 10*3/uL — ABNORMAL HIGH (ref 4.0–10.5)
nRBC: 0.3 % — ABNORMAL HIGH (ref 0.0–0.2)

## 2018-04-14 LAB — TYPE AND SCREEN
ABO/RH(D): O POS
Antibody Screen: NEGATIVE
Unit division: 0
Unit division: 0
Unit division: 0
Unit division: 0

## 2018-04-14 LAB — BPAM RBC
Blood Product Expiration Date: 202002122359
Blood Product Expiration Date: 202002122359
Blood Product Expiration Date: 202002122359
Blood Product Expiration Date: 202002122359
ISSUE DATE / TIME: 202001141550
ISSUE DATE / TIME: 202001141550
Unit Type and Rh: 5100
Unit Type and Rh: 5100
Unit Type and Rh: 5100
Unit Type and Rh: 5100

## 2018-04-14 LAB — PROTIME-INR
INR: 1.47
Prothrombin Time: 17.7 seconds — ABNORMAL HIGH (ref 11.4–15.2)

## 2018-04-14 MED ORDER — LOSARTAN POTASSIUM 25 MG PO TABS
25.0000 mg | ORAL_TABLET | Freq: Every day | ORAL | Status: DC
  Administered 2018-04-14 – 2018-04-15 (×2): 25 mg via ORAL
  Filled 2018-04-14 (×2): qty 1

## 2018-04-14 MED ORDER — METOPROLOL TARTRATE 25 MG PO TABS
25.0000 mg | ORAL_TABLET | Freq: Two times a day (BID) | ORAL | Status: DC
  Administered 2018-04-14 – 2018-04-15 (×3): 25 mg via ORAL
  Filled 2018-04-14 (×3): qty 1

## 2018-04-14 NOTE — Progress Notes (Addendum)
[address]       [place] [address]             [phone]      4 Days Post-Op Procedure(s) (LRB): MITRAL VALVE REPAIR (MVR) USING 28 MEMO 4D (N/A) CORONARY ARTERY BYPASS GRAFTING (CABG), ON PUMP, TIMES TWO, LIMA TO LAD AND SVG TO RCA USING LEFT INTERNAL MAMMARY ARTERY AND RIGHT GREATER SAPHENOUS VEIN HARVESTED ENDOSCOPICALLY (N/A) TRANSESOPHAGEAL ECHOCARDIOGRAM (TEE) (N/A) Subjective: Feels pretty well   Objective: Vital signs in last 24 hours: Temp:  [97.7 F (36.5 C)-98.6 F (37 C)] 98.6 F (37 C) (01/18 0741) Pulse Rate:  [81-104] 104 (01/18 0741) Cardiac Rhythm: Normal sinus rhythm;Heart block (01/18 0700) Resp:  [19-23] 19 (01/18 0741) BP: (121-155)/(70-93) 140/82 (01/18 0741) SpO2:  [97 %-100 %] 100 % (01/18 0741) Weight:  [70.1 kg] 70.1 kg (01/18 0424)  Hemodynamic parameters for last 24 hours:    Intake/Output from previous day: No intake/output data recorded. Intake/Output this shift: No intake/output data recorded.  General appearance: alert, cooperative and no distress Heart: regular rate and rhythm and tachy Lungs: dim in bases Abdomen: benign Extremities: no edema Wound: dressings CDI  Lab Results: Recent Labs    04/13/18 0254 04/14/18 0404  WBC 17.4* 12.0*  HGB 8.0* 7.4*  HCT 24.0* 23.6*  PLT 91* 100*   BMET:  Recent Labs    04/11/18 1646 04/12/18 0415  NA 139 135  K 4.5 4.5  CL 105 107  CO2  --  25  GLUCOSE 171* 117*  BUN 17 16  CREATININE 0.80 0.87  CALCIUM  --  8.0*    PT/INR:  Recent Labs    04/14/18 0404  LABPROT 17.7*  INR 1.47   ABG    Component Value Date/Time   PHART 7.319 (L) 04/10/2018 2121   HCO3 23.8 04/10/2018 2121   TCO2 23 04/11/2018 1646   ACIDBASEDEF 2.0 04/10/2018 2121   O2SAT 99.0 04/10/2018 2121   CBG (last 3)  Recent Labs    04/11/18 2329 04/12/18 0316 04/12/18 0717  GLUCAP 130* 109* 136*    Meds Scheduled Meds: . acetaminophen  1,000 mg Oral Q6H  . aspirin EC   81 mg Oral Daily  . atorvastatin  80 mg Oral q1800  . bisacodyl  10 mg Oral Daily   Or  . bisacodyl  10 mg Rectal Daily  . Chlorhexidine Gluconate Cloth  6 each Topical Daily  . docusate sodium  200 mg Oral Daily  . enoxaparin (LOVENOX) injection  30 mg Subcutaneous Q24H  . folic acid-pyridoxine-cyancobalamin  1 tablet Oral Daily  . furosemide  40 mg Oral Daily  . metoprolol tartrate  12.5 mg Oral BID  . pantoprazole  40 mg Oral Daily  . potassium chloride  20 mEq Oral Q breakfast  . sodium chloride flush  10-40 mL Intracatheter Q12H  . sodium chloride flush  3 mL Intravenous Q12H  . warfarin  4 mg Oral q1800  . Warfarin - Physician Dosing Inpatient   Does not apply q1800   Continuous Infusions: . sodium chloride    . lactated ringers 10 mL/hr at 04/10/18 1348   PRN Meds:.metoprolol tartrate, morphine injection, ondansetron (ZOFRAN) IV, oxyCODONE, sodium chloride flush, sodium chloride flush, traMADol  Xrays Dg Chest 2 View  Result Date: 04/13/2018 CLINICAL DATA:  Follow-up atelectasis EXAM: CHEST - 2 VIEW COMPARISON:  04/12/2018 FINDINGS: Cardiac shadow is enlarged but stable. Postsurgical changes are again seen. Bilateral thoracostomy catheters,  mediastinal drain and pericardial drain have been removed in the interval. No pneumothorax is seen. The overall inspiratory effort is poor with bibasilar atelectatic changes. No bony abnormality is noted. IMPRESSION: Bibasilar atelectasis somewhat improved from the prior exam. Interval tube removal without pneumothorax. Electronically Signed   By: Inez Catalina M.D.   On: 04/13/2018 07:55    Assessment/Plan: S/P Procedure(s) (LRB): MITRAL VALVE REPAIR (MVR) USING 28 MEMO 4D (N/A) CORONARY ARTERY BYPASS GRAFTING (CABG), ON PUMP, TIMES TWO, LIMA TO LAD AND SVG TO RCA USING LEFT INTERNAL MAMMARY ARTERY AND RIGHT GREATER SAPHENOUS VEIN HARVESTED ENDOSCOPICALLY (N/A) TRANSESOPHAGEAL ECHOCARDIOGRAM (TEE) (N/A)  1 conts to do very well 2 BP is  up will start ARB low dose 3 some tachy, will increase beta blocker 4 INR rising, will cont coumadin at same dose 5 d/c epw's cont lasix for mild volume overload 6 thrombocytopenia trend improving 7 H/H down slightly, tl well, cont foltyx 8 routine pulm toilet/cardiac rehab  LOS: 4 days    Tami Wood [facility] 04/14/2018 Pager [phone]   Chart reviewed, patient examined, agree with above. She feels well and wants to go home tomorrow. Still on 1 L oxygen. Encouraged to use IS and flutter valve and ambulate. If she gets off oxygen she could go home tomorrow. Will need to decide on Coumadin dose.

## 2018-04-14 NOTE — Progress Notes (Signed)
CARDIAC REHAB PHASE I   PRE:  Rate/Rhythm: Sinus Tach 101  BP:    Sitting: 139/87     SaO2: 94%  MODE:  Ambulation: 250 ft   POST:  Rate/Rhythem: Sinus tach 122  BP:    Sitting: 142/91     SaO2: 89-91% Room Air [phone], [phone]  Patient ambulated with assistance times one in the hallway. Oxygen saturation noted at 89-90 on room air. Patient encouraged to deep breath and assisted back to bed. Heart rate noted at 122. Patient says she can feel her heart racing. Otherwise asymptomatic. Patient's RN Kelle Darting and Jadene Pierini [facility] notified. Patient was placed back on 2l/min of oxygen. Oxygen saturation 98%. Came back and completed Ms Trimble education. Reviewed use of incentive spirometer, exercise instructions. Patient given information about phase 2 cardiac rehab. Patient says she is not sure that she will participate because she wants to go back to work as soon as she is cleared to do so.  Discussed heart healthy diet and set patient up for discharge open heart surgery video.  Harrell Gave RN BSN

## 2018-04-14 NOTE — Progress Notes (Signed)
Patient ambulated in hallway with nursing staff and oxygen. Will monitor patient. [name], Bettina Gavia RN

## 2018-04-14 NOTE — Progress Notes (Signed)
EPW pulled per protocol and as ordered. All ends intact. Slight resistance met with Rt atrial wires but were removed. Patient reminded to lie supine approximately one hour. bp 155/91 Heart rate 84 on monitor . Will monitor patient. Zuha Dejonge, Bettina Gavia RN

## 2018-04-14 NOTE — Discharge Instructions (Signed)
Discharge Instructions:  1. You may shower, please wash incisions daily with soap and water and keep dry.  If you wish to cover wounds with dressing you may do so but please keep clean and change daily.  No tub baths or swimming until incisions have completely healed.  If your incisions become red or develop any drainage please call our office at [phone]  2. No Driving until cleared by Dr. Guy Sandifer office and you are no longer using narcotic pain medications  3. Monitor your weight daily.. Please use the same scale and weigh at same time... If you gain 5-10 lbs in 48 hours with associated lower extremity swelling, please contact our office at [phone]  4. Fever of 101.5 for at least 24 hours with no source, please contact our office at [phone]  5. Activity- up as tolerated, please walk at least 3 times per day.  Avoid strenuous activity, no lifting, pushing, or pulling with your arms over 8-10 lbs for a minimum of 6 weeks  6. If any questions or concerns arise, please do not hesitate to contact our office at [phone]  Information on my medicine - Coumadin   (Warfarin)  This medication education was reviewed with me or my healthcare representative as part of my discharge preparation.  The pharmacist that spoke with me during my hospital stay was:  Einar Grad, [facility]  Why was Coumadin prescribed for you? Coumadin was prescribed for you because you have a blood clot or a medical condition that can cause an increased risk of forming blood clots. Blood clots can cause serious health problems by blocking the flow of blood to the heart, lung, or brain. Coumadin can prevent harmful blood clots from forming. As a reminder your indication for Coumadin is:   Blood Clot Prevention After Heart Valve Surgery  What test will check on my response to Coumadin? While on Coumadin (warfarin) you will need to have an INR test regularly to ensure that your dose is keeping you in the desired  range. The INR (international normalized ratio) number is calculated from the result of the laboratory test called prothrombin time (PT).  If an INR APPOINTMENT HAS NOT ALREADY BEEN MADE FOR YOU please schedule an appointment to have this lab work done by your health care provider within 7 days. Your INR goal is usually a number between:  2 to 3 or your provider may give you a more narrow range like 2-2.5.  Ask your health care provider during an office visit what your goal INR is.  What  do you need to  know  About  COUMADIN? Take Coumadin (warfarin) exactly as prescribed by your healthcare provider about the same time each day.  DO NOT stop taking without talking to the doctor who prescribed the medication.  Stopping without other blood clot prevention medication to take the place of Coumadin may increase your risk of developing a new clot or stroke.  Get refills before you run out.  What do you do if you miss a dose? If you miss a dose, take it as soon as you remember on the same day then continue your regularly scheduled regimen the next day.  Do not take two doses of Coumadin at the same time.  Important Safety Information A possible side effect of Coumadin (Warfarin) is an increased risk of bleeding. You should call your healthcare provider right away if you experience any of the following: ? Bleeding from an injury or your nose that does  not stop. ? Unusual colored urine (red or dark brown) or unusual colored stools (red or black). ? Unusual bruising for unknown reasons. ? A serious fall or if you hit your head (even if there is no bleeding).  Some foods or medicines interact with Coumadin (warfarin) and might alter your response to warfarin. To help avoid this: ? Eat a balanced diet, maintaining a consistent amount of Vitamin K. ? Notify your provider about major diet changes you plan to make. ? Avoid alcohol or limit your intake to 1 drink for women and 2 drinks for men per day. (1  drink is 5 oz. wine, 12 oz. beer, or 1.5 oz. liquor.)  Make sure that ANY health care provider who prescribes medication for you knows that you are taking Coumadin (warfarin).  Also make sure the healthcare provider who is monitoring your Coumadin knows when you have started a new medication including herbals and non-prescription products.  Coumadin (Warfarin)  Major Drug Interactions  Increased Warfarin Effect Decreased Warfarin Effect  Alcohol (large quantities) Antibiotics (esp. Septra/Bactrim, Flagyl, Cipro) Amiodarone (Cordarone) Aspirin (ASA) Cimetidine (Tagamet) Megestrol (Megace) NSAIDs (ibuprofen, naproxen, etc.) Piroxicam (Feldene) Propafenone (Rythmol SR) Propranolol (Inderal) Isoniazid (INH) Posaconazole (Noxafil) Barbiturates (Phenobarbital) Carbamazepine (Tegretol) Chlordiazepoxide (Librium) Cholestyramine (Questran) Griseofulvin Oral Contraceptives Rifampin Sucralfate (Carafate) Vitamin K   Coumadin (Warfarin) Major Herbal Interactions  Increased Warfarin Effect Decreased Warfarin Effect  Garlic Ginseng Ginkgo biloba Coenzyme Q10 Green tea St. Johns wort    Coumadin (Warfarin) FOOD Interactions  Eat a consistent number of servings per week of foods HIGH in Vitamin K (1 serving =  cup)  Collards (cooked, or boiled & drained) Kale (cooked, or boiled & drained) Mustard greens (cooked, or boiled & drained) Parsley *serving size only =  cup Spinach (cooked, or boiled & drained) Swiss chard (cooked, or boiled & drained) Turnip greens (cooked, or boiled & drained)  Eat a consistent number of servings per week of foods MEDIUM-HIGH in Vitamin K (1 serving = 1 cup)  Asparagus (cooked, or boiled & drained) Broccoli (cooked, boiled & drained, or raw & chopped) Brussel sprouts (cooked, or boiled & drained) *serving size only =  cup Lettuce, raw (green leaf, endive, romaine) Spinach, raw Turnip greens, raw & chopped   These websites have more information  on Coumadin (warfarin):  FailFactory.se; VeganReport.com.au;

## 2018-04-15 LAB — PROTIME-INR
INR: 1.84
Prothrombin Time: 21 seconds — ABNORMAL HIGH (ref 11.4–15.2)

## 2018-04-15 MED ORDER — WARFARIN SODIUM 1 MG PO TABS: 2.5000 mg | ORAL_TABLET | Freq: Every day | ORAL | 1 refills | Status: DC

## 2018-04-15 MED ORDER — OXYCODONE HCL 5 MG PO TABS: 5.0000 mg | ORAL_TABLET | ORAL | 0 refills | Status: AC | PRN

## 2018-04-15 MED ORDER — ATORVASTATIN CALCIUM 80 MG PO TABS: 80.0000 mg | ORAL_TABLET | Freq: Every day | ORAL | 1 refills | Status: DC

## 2018-04-15 MED ORDER — FA-PYRIDOXINE-CYANOCOBALAMIN 2.5-25-2 MG PO TABS: 1.0000 | ORAL_TABLET | Freq: Every day | ORAL | 1 refills | Status: DC

## 2018-04-15 MED ORDER — METOPROLOL TARTRATE 25 MG PO TABS: 25.0000 mg | ORAL_TABLET | Freq: Two times a day (BID) | ORAL | 1 refills | Status: DC

## 2018-04-15 NOTE — Progress Notes (Signed)
[address]       [place] [address]             [id number]      5 Days Post-Op Procedure(s) (LRB): MITRAL VALVE REPAIR (MVR) USING 28 MEMO 4D (N/A) CORONARY ARTERY BYPASS GRAFTING (CABG), ON PUMP, TIMES TWO, LIMA TO LAD AND SVG TO RCA USING LEFT INTERNAL MAMMARY ARTERY AND RIGHT GREATER SAPHENOUS VEIN HARVESTED ENDOSCOPICALLY (N/A) TRANSESOPHAGEAL ECHOCARDIOGRAM (TEE) (N/A) Subjective: Feels good, off O2 with good sats No new issues  Objective: Vital signs in last 24 hours: Temp:  [98.2 F (36.8 C)-99.1 F (37.3 C)] 98.2 F (36.8 C) (01/19 0255) Pulse Rate:  [69-104] 83 (01/19 0255) Cardiac Rhythm: Heart block (01/19 0255) Resp:  [16-39] 28 (01/19 0255) BP: (119-159)/(65-91) 141/89 (01/19 0255) SpO2:  [95 %-100 %] 98 % (01/19 0255) Weight:  [80.8 kg] 80.8 kg (01/19 0500)  Hemodynamic parameters for last 24 hours:    Intake/Output from previous day: 01/18 0701 - 01/19 0700 In: 360 [P.O.:360] Out: 650 [Urine:650] Intake/Output this shift: Total I/O In: 120 [P.O.:120] Out: -   General appearance: alert, cooperative and no distress Heart: regular rate and rhythm and occ extrasystole Lungs: min dim in bases Abdomen: benign Extremities: no edema Wound: incis healing well  Lab Results: Recent Labs    04/13/18 0254 04/14/18 0404  WBC 17.4* 12.0*  HGB 8.0* 7.4*  HCT 24.0* 23.6*  PLT 91* 100*   BMET: No results for input(s): NA, K, CL, CO2, GLUCOSE, BUN, CREATININE, CALCIUM in the last 72 hours.  PT/INR:  Recent Labs    04/15/18 0249  LABPROT 21.0*  INR 1.84   ABG    Component Value Date/Time   PHART 7.319 (L) 04/10/2018 2121   HCO3 23.8 04/10/2018 2121   TCO2 23 04/11/2018 1646   ACIDBASEDEF 2.0 04/10/2018 2121   O2SAT 99.0 04/10/2018 2121   CBG (last 3)  Recent Labs    04/12/18 0717  GLUCAP 136*    Meds Scheduled Meds: . acetaminophen  1,000 mg Oral Q6H  . aspirin EC  81 mg Oral Daily  . atorvastatin  80 mg Oral  q1800  . bisacodyl  10 mg Oral Daily   Or  . bisacodyl  10 mg Rectal Daily  . Chlorhexidine Gluconate Cloth  6 each Topical Daily  . docusate sodium  200 mg Oral Daily  . enoxaparin (LOVENOX) injection  30 mg Subcutaneous Q24H  . folic acid-pyridoxine-cyancobalamin  1 tablet Oral Daily  . furosemide  40 mg Oral Daily  . losartan  25 mg Oral Daily  . metoprolol tartrate  25 mg Oral BID  . pantoprazole  40 mg Oral Daily  . potassium chloride  20 mEq Oral Q breakfast  . sodium chloride flush  10-40 mL Intracatheter Q12H  . sodium chloride flush  3 mL Intravenous Q12H  . warfarin  4 mg Oral q1800  . Warfarin - Physician Dosing Inpatient   Does not apply q1800   Continuous Infusions: . sodium chloride    . lactated ringers 10 mL/hr at 04/10/18 1348   PRN Meds:.metoprolol tartrate, morphine injection, ondansetron (ZOFRAN) IV, oxyCODONE, sodium chloride flush, sodium chloride flush, traMADol  Xrays No results found.  Assessment/Plan: S/P Procedure(s) (LRB): MITRAL VALVE REPAIR (MVR) USING 28 MEMO 4D (N/A) CORONARY ARTERY BYPASS GRAFTING (CABG), ON PUMP, TIMES TWO, LIMA TO LAD AND SVG TO RCA USING LEFT INTERNAL MAMMARY ARTERY AND RIGHT GREATER SAPHENOUS VEIN HARVESTED ENDOSCOPICALLY (N/A) TRANSESOPHAGEAL  ECHOCARDIOGRAM (TEE) (N/A)  1 conts to do well 2 hemodyn more stable with less tachycardia and BP better controlled , ARB - increase to home dose 3 INR 1.84- will d/c on 2.5 of coumadin with INR check this week 4 does not appear volume overloaded- will resume home HCTZ as may help with BP control as well 5 stable for d/c  LOS: 5 days    John Giovanni PA-C 04/15/2018 Pager [phone]

## 2018-04-15 NOTE — Progress Notes (Signed)
Pt ambulated around unit with front wheel walker on room air pt tolerated well

## 2018-04-16 ENCOUNTER — Telehealth: Payer: Self-pay | Admitting: *Deleted

## 2018-04-16 NOTE — Telephone Encounter (Signed)
Pt was on TCM report admitted 04/10/18 for procedure mitral valve repair and CABG.  She has history of heart murmur and essential hypertension. Pt D/C 04/15/18, and will follow-up w/ Nigel Mormon, MD (Cardiology) on 04/18/2018; Appointment time is at 10:00.Marland KitchenJohny Chess

## 2018-04-17 MED FILL — Mannitol IV Soln 20%: INTRAVENOUS | Qty: 500 | Status: AC

## 2018-04-17 MED FILL — Sodium Bicarbonate IV Soln 8.4%: INTRAVENOUS | Qty: 50 | Status: AC

## 2018-04-17 MED FILL — Electrolyte-R (PH 7.4) Solution: INTRAVENOUS | Qty: 3000 | Status: AC

## 2018-04-17 MED FILL — Heparin Sodium (Porcine) Inj 1000 Unit/ML: INTRAMUSCULAR | Qty: 10 | Status: AC

## 2018-04-17 MED FILL — Sodium Chloride IV Soln 0.9%: INTRAVENOUS | Qty: 2000 | Status: AC

## 2018-04-17 MED FILL — Lidocaine HCl(Cardiac) IV PF Soln Pref Syr 100 MG/5ML (2%): INTRAVENOUS | Qty: 5 | Status: AC

## 2018-04-17 NOTE — Consult Note (Signed)
            [facility] CM Primary Care Navigator  04/17/2018  Tami Wood Mar 13, 1951 [id number]   Went to seepatient at the bedside to identify possible discharge needs butshewasalreadydischargedhomeper staff.  Per MD note,patient has been referred for surgical consultation to discuss treatment options for management of mitral valve prolapse with severe symptomatic primary mitral regurgitation and multivessel coronary artery disease. (status post mitral valve repair and CABG- coronary artery bypass grafting x 2)  Patient has discharge instruction to follow-up withcardiology on 04/18/2018 and 2/10/ 2020.   Patient's primary care provider's office is listed as providing transition of care (TOC) follow-up.   For additional questions please contact:  Edwena Felty A. Edd Reppert, BSN, RN-BC [facility] PRIMARY CARE Navigator Cell: [phone]

## 2018-04-18 DIAGNOSIS — Z951 Presence of aortocoronary bypass graft: Secondary | ICD-10-CM | POA: Diagnosis not present

## 2018-04-18 DIAGNOSIS — Z7901 Long term (current) use of anticoagulants: Secondary | ICD-10-CM | POA: Diagnosis not present

## 2018-04-18 DIAGNOSIS — Z9889 Other specified postprocedural states: Secondary | ICD-10-CM | POA: Diagnosis not present

## 2018-04-18 DIAGNOSIS — I10 Essential (primary) hypertension: Secondary | ICD-10-CM | POA: Diagnosis not present

## 2018-04-20 ENCOUNTER — Telehealth (HOSPITAL_COMMUNITY): Payer: Self-pay

## 2018-04-20 NOTE — Telephone Encounter (Signed)
Pt insurance is active and benefits verified through Medicare A/B Co-pay $0.00, DED $198.00/$114.06 met, out of pocket $0.00/$0.00 met, co-insurance 20%. No pre-authorization required. Passport, 04/20/2018 @ 4:29pm, REF# [id number]  2ndary insurance is active and benefits verified through [place] $0.00, DED $0.00/$0.00 met, out of pocket $0.00/$0.00 met, co-insurance 0%. No pre-authorization required. Passport, 04/20/2018 @ 4:30pm, REF# [id number]

## 2018-04-23 NOTE — Telephone Encounter (Signed)
Attempted to call patient in regards to Cardiac Rehab - LM on VM 

## 2018-04-27 ENCOUNTER — Other Ambulatory Visit (HOSPITAL_COMMUNITY): Payer: Self-pay | Admitting: Cardiology

## 2018-04-27 DIAGNOSIS — D649 Anemia, unspecified: Secondary | ICD-10-CM | POA: Diagnosis not present

## 2018-04-27 DIAGNOSIS — Z7901 Long term (current) use of anticoagulants: Secondary | ICD-10-CM | POA: Diagnosis not present

## 2018-04-27 DIAGNOSIS — I10 Essential (primary) hypertension: Secondary | ICD-10-CM | POA: Diagnosis not present

## 2018-04-28 LAB — CBC WITH DIFFERENTIAL/PLATELET
BASOS: 1 %
Basophils Absolute: 0.1 10*3/uL (ref 0.0–0.2)
EOS (ABSOLUTE): 0 10*3/uL (ref 0.0–0.4)
Eos: 0 %
Hematocrit: 28.6 % — ABNORMAL LOW (ref 34.0–46.6)
Hemoglobin: 9.6 g/dL — ABNORMAL LOW (ref 11.1–15.9)
Immature Grans (Abs): 0 10*3/uL (ref 0.0–0.1)
Immature Granulocytes: 1 %
Lymphocytes Absolute: 1.2 10*3/uL (ref 0.7–3.1)
Lymphs: 17 %
MCH: 29.4 pg (ref 26.6–33.0)
MCHC: 33.6 g/dL (ref 31.5–35.7)
MCV: 88 fL (ref 79–97)
Monocytes Absolute: 0.6 10*3/uL (ref 0.1–0.9)
Monocytes: 8 %
NEUTROS ABS: 5 10*3/uL (ref 1.4–7.0)
Neutrophils: 73 %
Platelets: 277 10*3/uL (ref 150–450)
RBC: 3.27 x10E6/uL — ABNORMAL LOW (ref 3.77–5.28)
RDW: 13.4 % (ref 11.7–15.4)
WBC: 6.8 10*3/uL (ref 3.4–10.8)

## 2018-04-28 LAB — BASIC METABOLIC PANEL
BUN/Creatinine Ratio: 12 (ref 12–28)
BUN: 8 mg/dL (ref 8–27)
CO2: 22 mmol/L (ref 20–29)
CREATININE: 0.69 mg/dL (ref 0.57–1.00)
Calcium: 8.9 mg/dL (ref 8.7–10.3)
Chloride: 100 mmol/L (ref 96–106)
GFR calc Af Amer: 103 mL/min/{1.73_m2} (ref >59)
GFR calc non Af Amer: 89 mL/min/{1.73_m2} (ref >59)
Glucose: 124 mg/dL — ABNORMAL HIGH (ref 65–99)
Potassium: 4.1 mmol/L (ref 3.5–5.2)
Sodium: 137 mmol/L (ref 134–144)

## 2018-05-08 NOTE — Telephone Encounter (Signed)
Attempted to call pt a 2nd time- LM ON VM

## 2018-05-11 ENCOUNTER — Other Ambulatory Visit: Payer: Self-pay | Admitting: Thoracic Surgery (Cardiothoracic Vascular Surgery)

## 2018-05-11 DIAGNOSIS — I2510 Atherosclerotic heart disease of native coronary artery without angina pectoris: Secondary | ICD-10-CM

## 2018-05-14 ENCOUNTER — Ambulatory Visit
Admission: RE | Admit: 2018-05-14 | Discharge: 2018-05-14 | Disposition: A | Discharge status: Home / Self Care | Payer: Medicare Other | Source: Ambulatory Visit | Attending: Thoracic Surgery (Cardiothoracic Vascular Surgery) | Admitting: Thoracic Surgery (Cardiothoracic Vascular Surgery)

## 2018-05-14 ENCOUNTER — Ambulatory Visit (INDEPENDENT_AMBULATORY_CARE_PROVIDER_SITE_OTHER): Payer: Self-pay | Admitting: Physician Assistant

## 2018-05-14 VITALS — Ht 65.0 in | Wt 150.0 lb

## 2018-05-14 DIAGNOSIS — Z9889 Other specified postprocedural states: Secondary | ICD-10-CM

## 2018-05-14 DIAGNOSIS — Z951 Presence of aortocoronary bypass graft: Secondary | ICD-10-CM

## 2018-05-14 DIAGNOSIS — J90 Pleural effusion, not elsewhere classified: Secondary | ICD-10-CM | POA: Diagnosis not present

## 2018-05-14 DIAGNOSIS — I2510 Atherosclerotic heart disease of native coronary artery without angina pectoris: Secondary | ICD-10-CM

## 2018-05-14 NOTE — Progress Notes (Signed)
HPI:  Patient returns for routine postoperative follow-up having undergone CABG x 2 and MV Repair on 04/10/2018.  The patient's early postoperative recovery while in the hospital was unremarkable and she progressed without complications.  Since hospital discharge the patient reports she is doing pretty good.  She is ambulating around her house without difficulty.  She has lost about 24 lbs due to lack of appetite due to poor taste and fear of what she should and should not be eating.  Her sister is present today and states she hates taking medications and they have been having a hard time getting her to take them.  She denies lower extremity swelling.  She states her incisions are healing without evidence of infection.  Current Outpatient Medications  Medication Sig Dispense Refill  . amoxicillin (AMOXIL) 500 MG capsule Take 2 capsules (1,000 mg total) by mouth 2 (two) times daily. (Patient taking differently: Take 2,000 mg by mouth See admin instructions. For Dental Procedures) 40 capsule 0  . aspirin 81 MG EC tablet Take 81 mg by mouth daily.      Marland Kitchen atorvastatin (LIPITOR) 80 MG tablet Take 1 tablet (80 mg total) by mouth daily at 6 PM. 30 tablet 1  . metoprolol tartrate (LOPRESSOR) 25 MG tablet Take 1 tablet (25 mg total) by mouth 2 (two) times daily. 60 tablet 1  . spironolactone (ALDACTONE) 50 MG tablet Take 50 mg by mouth daily.    Marland Kitchen warfarin (COUMADIN) 1 MG tablet Take 2.5 tablets (2.5 mg total) by mouth daily at 6 PM. As directed by coumadin clinic 150 tablet 1   No current facility-administered medications for this visit.     Physical Exam:  Ht 5\' 5"  (1.651 m)   Wt 150 lb (68 kg)   BMI 24.96 kg/m   Gen: no apparent distress Heart: RRR Lungs: CTA bilaterally Ext: no edema Incisions: well healed  Diagnostic Tests:  CXR; no pneumothorax, minimal pleural effusions bilaterally  A/P:  1. S/p CABG/AVR doing very well- continue Lopressor, Lipitor, Coumadin.. can hopefully d/c in  April at follow up visit with Dr. Roxy Manns 2. Activity-okay to resume driving, increase ambulation as tolerated, okay to start cardiac rehab if she decides to participate.  She was instructed on continued sternal precautions for the next several weeks 3. Diet- poor taste should resolve, gave instructions in regards to interactions of food with coumadin, encouraged patient to eat what she can, and instructed on a cardiac diet 4. Endocarditis prophylaxis discussed 5. Dispo- patient doing very well, RTC in 3 months with Dr. Geraldo Docker, PA-C [facility] [phone]

## 2018-05-14 NOTE — Patient Instructions (Addendum)
You may return to driving an automobile as long as you are no longer requiring oral narcotic pain relievers during the daytime.  It would be wise to start driving only short distances during the daylight and gradually increase from there as you feel comfortable.  Endocarditis is a potentially serious infection of heart valves or inside lining of the heart.  It occurs more commonly in patients with diseased heart valves (such as patient's with aortic or mitral valve disease) and in patients who have undergone heart valve repair or replacement.  Certain surgical and dental procedures may put you at risk, such as dental cleaning, other dental procedures, or any surgery involving the respiratory, urinary, gastrointestinal tract, gallbladder or prostate gland.   To minimize your chances for develooping endocarditis, maintain good oral health and seek prompt medical attention for any infections involving the mouth, teeth, gums, skin or urinary tract.    Always notify your doctor or dentist about your underlying heart valve condition before having any invasive procedures. You will need to take antibiotics before certain procedures, including all routine dental cleanings or other dental procedures.  Your cardiologist or dentist should prescribe these antibiotics for you to be taken ahead of time.  You may continue to gradually increase your physical activity as tolerated.  Refrain from any heavy lifting or strenuous use of your arms and shoulders until at least 8 weeks from the time of your surgery, and avoid activities that cause increased pain in your chest on the side of your surgical incision.  Otherwise you may continue to increase activities without any particular limitations.  Increase the intensity and duration of physical activity gradually.  Vitamin K Foods and Warfarin Warfarin is a blood thinner (anticoagulant). Anticoagulant medicines help prevent the formation of blood clots. These medicines work  by decreasing the activity of vitamin K, which promotes normal blood clotting. When you take warfarin, problems can occur from suddenly increasing or decreasing the amount of vitamin K that you eat from one day to the next. Problems may include:  Blood clots.  Bleeding. What general guidelines do I need to follow? To avoid problems when taking warfarin:  Eat a balanced diet that includes: ? Fresh fruits and vegetables. ? Whole grains. ? Low-fat dairy products. ? Lean proteins, such as fish, eggs, and lean cuts of meat.  Keep your intake of vitamin K consistent from day to day. To do this: ? Avoid eating large amounts of vitamin K one day and low amounts of vitamin K the next day. ? If you take a multivitamin that contains vitamin K, be sure to take it every day. ? Know which foods contain vitamin K. Use the lists below to understand serving sizes and the amount of vitamin K in one serving.  Avoid major changes in your diet. If you are going to change your diet, talk with your health care provider before making changes.  Work with a Financial planner (dietitian) to develop a meal plan that works best for you.  High vitamin K foods Foods that are high in vitamin K contain more than 100 mcg (micrograms) per serving. These include:  Broccoli (cooked) -  cup has 110 mcg.  Brussels sprouts (cooked) -  cup has 109 mcg.  Greens, beet (cooked) -  cup has 350 mcg.  Greens, collard (cooked) -  cup has 418 mcg.  Greens, turnip (cooked) -  cup has 265 mcg.  Green onions or scallions -  cup has 105 mcg.  Irene Limbo (  fresh or frozen) -  cup has 531 mcg.  Parsley (raw) - 10 sprigs has 164 mcg.  Spinach (cooked) -  cup has 444 mcg.  Swiss chard (cooked) -  cup has 287 mcg. Moderate vitamin K foods Foods that have a moderate amount of vitamin K contain 25-100 mcg per serving. These include:  Asparagus (cooked) - 5 spears have 38 mcg.  Black-eyed peas (dried) -  cup has 32  mcg.  Cabbage (cooked) -  cup has 37 mcg.  Kiwi fruit - 1 medium has 31 mcg.  Lettuce - 1 cup has 57-63 mcg.  Okra (frozen) -  cup has 44 mcg.  Prunes (dried) - 5 prunes have 25 mcg.  Watercress (raw) - 1 cup has 85 mcg. Low vitamin K foods Foods low in vitamin K contain less than 25 mcg per serving. These include:  Artichoke - 1 medium has 18 mcg.  Avocado - 1 oz. has 6 mcg.  Blueberries -  cup has 14 mcg.  Cabbage (raw) -  cup has 21 mcg.  Carrots (cooked) -  cup has 11 mcg.  Cauliflower (raw) -  cup has 11 mcg.  Cucumber with peel (raw) -  cup has 9 mcg.  Grapes -  cup has 12 mcg.  Mango - 1 medium has 9 mcg.  Nuts - 1 oz. has 15 mcg.  Pear - 1 medium has 8 mcg.  Peas (cooked) -  cup has 19 mcg.  Pickles - 1 spear has 14 mcg.  Pumpkin seeds - 1 oz. has 13 mcg.  Sauerkraut (canned) -  cup has 16 mcg.  Soybeans (cooked) -  cup has 16 mcg.  Tomato (raw) - 1 medium has 10 mcg.  Tomato sauce -  cup has 17 mcg. Vitamin K-free foods If a food contain less than 5 mcg per serving, it is considered to have no vitamin K. These foods include:  Bread and cereal products.  Cheese.  Eggs.  Fish and shellfish.  Meat and poultry.  Milk and dairy products.  Sunflower seeds. Actual amounts of vitamin K in foods may be different depending on processing. Talk with your dietitian about what foods you can eat and what foods you should avoid. This information is not intended to replace advice given to you by your health care provider. Make sure you discuss any questions you have with your health care provider. Document Released: 01/09/2009 Document Revised: 10/04/2015 Document Reviewed: 06/17/2015 Elsevier Interactive Patient Education  2019 [employer].

## 2018-05-21 ENCOUNTER — Telehealth (HOSPITAL_COMMUNITY): Payer: Self-pay

## 2018-05-21 NOTE — Telephone Encounter (Signed)
Recv'd phone call from pt in regards to CR, pt will like to participate. Patient will come in for orientation on 06/05/2018 @ 8:15AM and will attend the 8:15AM exercise class. Went over insurance, patient verbalized understanding.   Mailed homework package.

## 2018-05-24 ENCOUNTER — Telehealth (HOSPITAL_COMMUNITY): Payer: Self-pay | Admitting: Pharmacist

## 2018-05-25 ENCOUNTER — Ambulatory Visit (INDEPENDENT_AMBULATORY_CARE_PROVIDER_SITE_OTHER): Payer: Medicare Other | Admitting: Cardiology

## 2018-05-25 ENCOUNTER — Telehealth (HOSPITAL_COMMUNITY): Payer: Self-pay

## 2018-05-25 DIAGNOSIS — Z5181 Encounter for therapeutic drug level monitoring: Secondary | ICD-10-CM | POA: Diagnosis not present

## 2018-05-25 DIAGNOSIS — Z7901 Long term (current) use of anticoagulants: Secondary | ICD-10-CM | POA: Diagnosis not present

## 2018-05-25 DIAGNOSIS — I2510 Atherosclerotic heart disease of native coronary artery without angina pectoris: Secondary | ICD-10-CM

## 2018-05-25 DIAGNOSIS — Z9889 Other specified postprocedural states: Secondary | ICD-10-CM | POA: Diagnosis not present

## 2018-05-29 LAB — POCT INR: INR: 1.4 — AB (ref 2.0–3.0)

## 2018-05-29 NOTE — Addendum Note (Signed)
Addended by: Milderd Meager on: 05/29/2018 11:35 AM   Modules accepted: Orders

## 2018-05-30 NOTE — Progress Notes (Signed)
Tami Wood 68 y.o. female DOB 04-15-1950 MRN [id number]       Nutrition Screen Note  No diagnosis found. Past Medical History:  Diagnosis Date  . ALLERGIC RHINITIS 06/18/2009  . ANEMIA-IRON DEFICIENCY 11/28/2006  . ANXIETY 11/28/2006  . Arthritis   . Coronary artery disease   . Cough 06/18/2009  . DEPRESSION 11/28/2006  . GERD 12/10/2007  . History of kidney stones   . HYPERTENSION 11/28/2006  . Mitral regurgitation   . MURMUR 11/28/2006  . PEPTIC ULCER DISEASE 12/10/2007  . 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 S/P mitral valve repair 04/10/2018   Complex valvuloplasty including artificial Gore-tex neochord placement x8 and Sorin Memo 4D ring annuloplasty, size 28   Meds reviewed.     Current Outpatient Medications (Cardiovascular):  .  atorvastatin (LIPITOR) 80 MG tablet, Take 1 tablet (80 mg total) by mouth daily at 6 PM. .  metoprolol tartrate (LOPRESSOR) 25 MG tablet, Take 1 tablet (25 mg total) by mouth 2 (two) times daily. Marland Kitchen  spironolactone (ALDACTONE) 50 MG tablet, Take 50 mg by mouth daily.   Current Outpatient Medications (Analgesics):  .  aspirin 81 MG EC tablet, Take 81 mg by mouth daily.    Current Outpatient Medications (Hematological):  .  warfarin (COUMADIN) 1 MG tablet, Take 2.5 tablets (2.5 mg total) by mouth daily at 6 PM. As directed by coumadin clinic  Current Outpatient Medications (Other):  .  amoxicillin (AMOXIL) 500 MG capsule, Take 2 capsules (1,000 mg total) by mouth 2 (two) times daily. (Patient taking differently: Take 2,000 mg by mouth See admin instructions. For Dental Procedures)   HT: Ht Readings from Last 1 Encounters:  05/14/18 5\' 5"  (1.651 m)    WT: Wt Readings from Last 5 Encounters:  05/14/18 150 lb (68 kg)  04/15/18 178 lb 2.1 oz (80.8 kg)  04/09/18 169 lb 12.8 oz (77 kg)  04/06/18 169 lb 14.4 oz (77.1 kg)  03/05/18 170 lb (77.1 kg)     5/19 to 2/20 weight loss of ~25 lbs  BMI =  24.96    (05/14/18)  Current tobacco use? No       Labs:  Lipid Panel     Component Value Date/Time   CHOL 152 08/16/2017 1710   TRIG 90.0 08/16/2017 1710   HDL 51.30 08/16/2017 1710   CHOLHDL 3 08/16/2017 1710   VLDL 18.0 08/16/2017 1710   LDLCALC 83 08/16/2017 1710    Lab Results  Component Value Date   HGBA1C 5.2 04/06/2018   CBG (last 3)  No results for input(s): GLUCAP in the last 72 hours.  Nutrition Diagnosis ? Food-and nutrition-related knowledge deficit related to lack of exposure to information as related to diagnosis of: ? CVD  Nutrition Goal(s):  ? To be determined  Plan:  Pt to attend nutrition classes ? Nutrition I ? Nutrition II ? Portion Distortion  Will provide client-centered nutrition education as part of interdisciplinary care.   Monitor and evaluate progress toward nutrition goal with team.  Laurina Bustle, MS, RD, LDN 05/30/2018 10:38 AM

## 2018-06-04 ENCOUNTER — Telehealth (HOSPITAL_COMMUNITY): Payer: Self-pay | Admitting: Pharmacist

## 2018-06-04 NOTE — Telephone Encounter (Signed)
Cardiac Rehab - Pharmacy Resident Documentation   Patient unable to be reached after three call attempts. Please complete allergy verification and medication review during patient's cardiac rehab appointment.    Thank you for allowing pharmacy to be a part of this patient's care.  Devinn Voshell, PharmD PGY1 Pharmacy Resident Phone: [phone]  

## 2018-06-05 ENCOUNTER — Encounter (HOSPITAL_COMMUNITY)
Admission: RE | Admit: 2018-06-05 | Discharge: 2018-06-05 | Disposition: A | Discharge status: Home / Self Care | Payer: Medicare Other | Source: Ambulatory Visit | Attending: Cardiology | Admitting: Cardiology

## 2018-06-05 ENCOUNTER — Encounter (HOSPITAL_COMMUNITY): Payer: Self-pay

## 2018-06-05 VITALS — BP 122/70 | HR 68 | Ht 65.0 in | Wt 158.7 lb

## 2018-06-05 DIAGNOSIS — Z9889 Other specified postprocedural states: Secondary | ICD-10-CM | POA: Insufficient documentation

## 2018-06-05 DIAGNOSIS — Z951 Presence of aortocoronary bypass graft: Secondary | ICD-10-CM | POA: Insufficient documentation

## 2018-06-05 NOTE — Progress Notes (Signed)
Cardiac Rehab Medication Review   Does the patient  feel that his/her medications are working for him/her?  yes  Has the patient been experiencing any side effects to the medications prescribed?  no  Does the patient measure his/her own blood pressure or blood glucose at home?  no   Does the patient have any problems obtaining medications due to transportation or finances?   no  Understanding of regimen: excellent Understanding of indications: excellent Potential of compliance: excellent    comments: Review of medication list with pt.  Pt is not taking her spirolactone.  Pt was prescribed this medication post Valve Surgery. Pt did not have any refills on the bottle.  When the medication was finished in February.  Pt denies any shortness of breath or swelling.  Pt has not noted any weight gain.  Pt will see Dr. Virgina Jock on Friday.  Pt to discuss this with Dr. Virgina Jock on Friday.  Maurice Small RN, BSN Cardiac and Pulmonary Rehab Nurse Navigator   06/05/2018 8:53 AM

## 2018-06-05 NOTE — Progress Notes (Signed)
Cardiac Individual Treatment Plan  Patient Details  Name: Tami Wood MRN: [id number] Date of Birth: [birth year] Referring Provider:     CARDIAC REHAB PHASE II ORIENTATION from 06/05/2018 in [facility]  Referring Provider  Patwardhan, Reynold Bowen, MD      Initial Encounter Date:    CARDIAC REHAB PHASE II ORIENTATION from 06/05/2018 in [facility]  Date  06/05/18      Visit Diagnosis: 04/10/2018 CABG x2  04/10/2018 MV Repair  Patient's Home Medications on Admission:  Current Outpatient Medications:  .  amoxicillin (AMOXIL) 500 MG capsule, Take 2 capsules (1,000 mg total) by mouth 2 (two) times daily. (Patient taking differently: Take 2,000 mg by mouth See admin instructions. For Dental Procedures), Disp: 40 capsule, Rfl: 0 .  aspirin 81 MG EC tablet, Take 81 mg by mouth daily.  , Disp: , Rfl:  .  atorvastatin (LIPITOR) 80 MG tablet, Take 1 tablet (80 mg total) by mouth daily at 6 PM., Disp: 30 tablet, Rfl: 1 .  metoprolol tartrate (LOPRESSOR) 25 MG tablet, Take 1 tablet (25 mg total) by mouth 2 (two) times daily., Disp: 60 tablet, Rfl: 1 .  warfarin (COUMADIN) 1 MG tablet, Take 2.5 tablets (2.5 mg total) by mouth daily at 6 PM. As directed by coumadin clinic, Disp: 150 tablet, Rfl: 1 .  spironolactone (ALDACTONE) 50 MG tablet, Take 50 mg by mouth daily., Disp: , Rfl:   Past Medical History: Past Medical History:  Diagnosis Date  . ALLERGIC RHINITIS 06/18/2009  . ANEMIA-IRON DEFICIENCY 11/28/2006  . ANXIETY 11/28/2006  . Arthritis   . Coronary artery disease   . Cough 06/18/2009  . DEPRESSION 11/28/2006  . GERD 12/10/2007  . History of kidney stones   . HYPERTENSION 11/28/2006  . Mitral regurgitation   . MURMUR 11/28/2006  . PEPTIC ULCER DISEASE 12/10/2007  . 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 S/P mitral valve repair 04/10/2018   Complex valvuloplasty including artificial Gore-tex neochord  placement x8 and Sorin Memo 4D ring annuloplasty, size 28    Tobacco Use: Social History   Tobacco Use  Smoking Status Never Smoker  Smokeless Tobacco Never Used    Labs: Recent Review Flowsheet Data    Labs for ITP Cardiac and Pulmonary Rehab Latest Ref Rng & Units 04/10/2018 04/10/2018 04/10/2018 04/10/2018 04/11/2018   Cholestrol 0 - 200 mg/dL - - - - -   LDLCALC 0 - 99 mg/dL - - - - -   HDL >39.00 mg/dL - - - - -   Trlycerides 0.0 - 149.0 mg/dL - - - - -   Hemoglobin A1c 4.8 - 5.6 % - - - - -   PHART 7.350 - 7.450 7.378 7.293(L) - 7.319(L) -   PCO2ART 32.0 - 48.0 mmHg 38.2 42.6 - 45.9 -   HCO3 20.0 - 28.0 mmol/L 23.1 20.8 - 23.8 -   TCO2 22 - 32 mmol/L 24 22 25 25 23    ACIDBASEDEF 0.0 - 2.0 mmol/L 2.0 5.0(H) - 2.0 -   O2SAT % 99.0 99.0 - 99.0 -      Capillary Blood Glucose: Lab Results  Component Value Date   GLUCAP 136 (H) 04/12/2018   GLUCAP 109 (H) 04/12/2018   GLUCAP 130 (H) 04/11/2018   GLUCAP 153 (H) 04/11/2018   GLUCAP 166 (H) 04/11/2018     Exercise Target Goals: Exercise Program Goal: Individual exercise  prescription set using results from initial 6 min walk test and THRR while considering  patient's activity barriers and safety.   Exercise Prescription Goal: Initial exercise prescription builds to 30-45 minutes a day of aerobic activity, 2-3 days per week.  Home exercise guidelines will be given to patient during program as part of exercise prescription that the participant will acknowledge.  Activity Barriers & Risk Stratification: Activity Barriers & Cardiac Risk Stratification - 06/05/18 0827      Activity Barriers & Cardiac Risk Stratification   Activity Barriers  Arthritis;Other (comment)    Comments  Right knee-arthritis    Cardiac Risk Stratification  High       6 Minute Walk: 6 Minute Walk    Row Name 06/05/18 0812         6 Minute Walk   Phase  Initial     Distance  1453 feet     Walk Time  6 minutes     # of Rest Breaks  0     MPH   2.75     METS  3.17     RPE  7     Perceived Dyspnea   0     VO2 Peak  11.08     Symptoms  No     Resting HR  68 bpm     Resting BP  122/70     Resting Oxygen Saturation   99 %     Exercise Oxygen Saturation  during 6 min walk  97 %     Max Ex. HR  86 bpm     Max Ex. BP  140/82     2 Minute Post BP  124/68        Oxygen Initial Assessment:   Oxygen Re-Evaluation:   Oxygen Discharge (Final Oxygen Re-Evaluation):   Initial Exercise Prescription: Initial Exercise Prescription - 06/05/18 1000      Date of Initial Exercise RX and Referring Provider   Date  06/05/18    Referring Provider  Nigel Mormon, MD    Expected Discharge Date  09/10/18      Recumbant Bike   Level  2    Watts  27    Minutes  10    METs  3.2      NuStep   Level  2    SPM  85    Minutes  10    METs  2.7      Track   Laps  12    Minutes  10    METs  3.09      Prescription Details   Frequency (times per week)  3    Duration  Progress to 30 minutes of continuous aerobic without signs/symptoms of physical distress      Intensity   THRR 40-80% of Max Heartrate  60-121    Ratings of Perceived Exertion  11-13    Perceived Dyspnea  0-4      Progression   Progression  Continue to progress workloads to maintain intensity without signs/symptoms of physical distress.      Resistance Training   Training Prescription  Yes    Weight  2lbs    Reps  10-15       Perform Capillary Blood Glucose checks as needed.  Exercise Prescription Changes:   Exercise Comments:   Exercise Goals and Review: Exercise Goals    Row Name 06/05/18 0827             Exercise Goals  Increase Physical Activity  Yes       Intervention  Provide advice, education, support and counseling about physical activity/exercise needs.;Develop an individualized exercise prescription for aerobic and resistive training based on initial evaluation findings, risk stratification, comorbidities and participant's  personal goals.       Expected Outcomes  Short Term: Attend rehab on a regular basis to increase amount of physical activity.;Long Term: Add in home exercise to make exercise part of routine and to increase amount of physical activity.;Long Term: Exercising regularly at least 3-5 days a week.       Increase Strength and Stamina  Yes       Intervention  Develop an individualized exercise prescription for aerobic and resistive training based on initial evaluation findings, risk stratification, comorbidities and participant's personal goals.;Provide advice, education, support and counseling about physical activity/exercise needs.       Expected Outcomes  Short Term: Increase workloads from initial exercise prescription for resistance, speed, and METs.;Short Term: Perform resistance training exercises routinely during rehab and add in resistance training at home;Long Term: Improve cardiorespiratory fitness, muscular endurance and strength as measured by increased METs and functional capacity (6MWT)       Able to understand and use rate of perceived exertion (RPE) scale  Yes       Intervention  Provide education and explanation on how to use RPE scale       Expected Outcomes  Short Term: Able to use RPE daily in rehab to express subjective intensity level;Long Term:  Able to use RPE to guide intensity level when exercising independently       Knowledge and understanding of Target Heart Rate Range (THRR)  Yes       Intervention  Provide education and explanation of THRR including how the numbers were predicted and where they are located for reference       Expected Outcomes  Long Term: Able to use THRR to govern intensity when exercising independently;Short Term: Able to state/look up THRR;Short Term: Able to use daily as guideline for intensity in rehab       Able to check pulse independently  Yes       Intervention  Provide education and demonstration on how to check pulse in carotid and radial  arteries.;Review the importance of being able to check your own pulse for safety during independent exercise       Expected Outcomes  Short Term: Able to explain why pulse checking is important during independent exercise;Long Term: Able to check pulse independently and accurately       Understanding of Exercise Prescription  Yes       Intervention  Provide education, explanation, and written materials on patient's individual exercise prescription       Expected Outcomes  Short Term: Able to explain program exercise prescription;Long Term: Able to explain home exercise prescription to exercise independently          Exercise Goals Re-Evaluation :   Discharge Exercise Prescription (Final Exercise Prescription Changes):   Nutrition:  Target Goals: Understanding of nutrition guidelines, daily intake of sodium 1500mg , cholesterol 200mg , calories 30% from fat and 7% or less from saturated fats, daily to have 5 or more servings of fruits and vegetables.  Biometrics: Pre Biometrics - 06/05/18 0745      Pre Biometrics   Height  5\' 5"  (1.651 m)    Weight  72 kg    Waist Circumference  32.5 inches    Hip Circumference  41.5 inches  Waist to Hip Ratio  0.78 %    BMI (Calculated)  26.41    Triceps Skinfold  19 mm    % Body Fat  35.3 %    Grip Strength  27 kg    Flexibility  14 in    Single Leg Stand  3.31 seconds        Nutrition Therapy Plan and Nutrition Goals:   Nutrition Assessments:   Nutrition Goals Re-Evaluation:   Nutrition Goals Re-Evaluation:   Nutrition Goals Discharge (Final Nutrition Goals Re-Evaluation):   Psychosocial: Target Goals: Acknowledge presence or absence of significant depression and/or stress, maximize coping skills, provide positive support system. Participant is able to verbalize types and ability to use techniques and skills needed for reducing stress and depression.  Initial Review & Psychosocial Screening: Initial Psych Review & Screening  - 06/05/18 1057      Initial Review   Current issues with  None Identified      Family Dynamics   Good Support System?  Yes   Decline has her family for support     Barriers   Psychosocial barriers to participate in program  There are no identifiable barriers or psychosocial needs.      Screening Interventions   Interventions  Encouraged to exercise       Quality of Life Scores: Quality of Life - 06/05/18 1017      Quality of Life   Select  Quality of Life      Quality of Life Scores   Health/Function Pre  28.47 %    Socioeconomic Pre  29.29 %    Psych/Spiritual Pre  29.14 %    Family Pre  30 %    GLOBAL Pre  28.97 %      Scores of 19 and below usually indicate a poorer quality of life in these areas.  A difference of  2-3 points is a clinically meaningful difference.  A difference of 2-3 points in the total score of the Quality of Life Index has been associated with significant improvement in overall quality of life, self-image, physical symptoms, and general health in studies assessing change in quality of life.  PHQ-9: Recent Review Flowsheet Data    Depression screen [facility] 2/9 04/09/2018 08/16/2017 07/01/2016 03/09/2016 11/06/2014   Decreased Interest 0 0 0 0 0   Down, Depressed, Hopeless 0 0 0 0 0   PHQ - 2 Score 0 0 0 0 0     Interpretation of Total Score  Total Score Depression Severity:  1-4 = Minimal depression, 5-9 = Mild depression, 10-14 = Moderate depression, 15-19 = Moderately severe depression, 20-27 = Severe depression   Psychosocial Evaluation and Intervention:   Psychosocial Re-Evaluation:   Psychosocial Discharge (Final Psychosocial Re-Evaluation):   Vocational Rehabilitation: Provide vocational rehab assistance to qualifying candidates.   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 Participant will state understanding/return demonstration of topics presented.  Learning Barriers/Preferences: Learning Barriers/Preferences - 06/05/18 1019      Learning Barriers/Preferences   Learning Barriers  Sight;Hearing    Learning Preferences  Skilled Demonstration;Written Material       Education Topics: Count  Your Pulse:  -Group instruction provided by verbal instruction, demonstration, patient participation and written materials to support subject.  Instructors address importance of being able to find your pulse and how to count your pulse when at home without a heart monitor.  Patients get hands on experience counting their pulse with staff help and individually.   Heart Attack, Angina, and Risk Factor Modification:  -Group instruction provided by verbal instruction, video, and written materials to support subject.  Instructors address signs and symptoms of angina and heart attacks.    Also discuss risk factors for heart disease and how to make changes to improve heart health risk factors.   Functional Fitness:  -Group instruction provided by verbal instruction, demonstration, patient participation, and written materials to support subject.  Instructors address safety measures for doing things around the house.  Discuss how to get up and down off the floor, how to pick things up properly, how to safely get out of a chair without assistance, and balance training.   Meditation and Mindfulness:  -Group instruction provided by verbal instruction, patient participation, and written materials to support subject.  Instructor addresses importance of mindfulness and meditation practice to help reduce stress and improve awareness.  Instructor also leads participants through a meditation exercise.     Stretching for Flexibility and Mobility:  -Group instruction provided by verbal instruction, patient participation, and written materials to support subject.  Instructors lead participants through series of stretches that are designed to increase flexibility thus improving mobility.  These stretches are additional exercise for major muscle groups that are typically performed during regular warm up and cool down.   Hands Only CPR:  -Group verbal, video, and participation provides a basic overview of AHA guidelines for community CPR. Role-play of emergencies allow participants the opportunity to practice calling for help and chest compression technique with discussion of AED use.   Hypertension: -Group verbal and written instruction that provides a basic overview of hypertension including the most recent diagnostic guidelines, risk factor reduction with self-care instructions and medication management.    Nutrition I class: Heart Healthy Eating:  -Group instruction provided by PowerPoint slides, verbal discussion, and written materials to support subject matter. The instructor gives an explanation and review of the Therapeutic Lifestyle Changes diet recommendations, which includes a discussion on lipid goals, dietary fat, sodium, fiber, plant stanol/sterol esters, sugar, and the components of a well-balanced, healthy diet.   Nutrition II class: Lifestyle Skills:  -Group instruction provided by PowerPoint slides, verbal discussion, and written materials to support subject matter. The instructor gives an explanation and review of label reading, grocery shopping for heart health, heart healthy recipe modifications, and ways to make healthier choices when eating out.   Diabetes Question & Answer:  -Group instruction provided by PowerPoint slides, verbal discussion, and written materials to support subject matter. The instructor gives an explanation and review of diabetes co-morbidities, pre-  and post-prandial blood glucose goals, pre-exercise blood glucose goals, signs, symptoms, and treatment of hypoglycemia and hyperglycemia, and foot care basics.   Diabetes Blitz:  -Group instruction provided by PowerPoint slides, verbal discussion, and written materials to support subject matter. The instructor gives an explanation and review of the physiology behind type 1 and type 2 diabetes, diabetes medications and rational behind using different medications, pre- and post-prandial blood glucose recommendations and Hemoglobin A1c goals, diabetes diet, and exercise including blood glucose guidelines for exercising safely.    Portion Distortion:  -Group instruction provided by PowerPoint slides, verbal discussion, written  materials, and food models to support subject matter. The instructor gives an explanation of serving size versus portion size, changes in portions sizes over the last 20 years, and what consists of a serving from each food group.   Stress Management:  -Group instruction provided by verbal instruction, video, and written materials to support subject matter.  Instructors review role of stress in heart disease and how to cope with stress positively.     Exercising on Your Own:  -Group instruction provided by verbal instruction, power point, and written materials to support subject.  Instructors discuss benefits of exercise, components of exercise, frequency and intensity of exercise, and end points for exercise.  Also discuss use of nitroglycerin and activating EMS.  Review options of places to exercise outside of rehab.  Review guidelines for sex with heart disease.   Cardiac Drugs I:  -Group instruction provided by verbal instruction and written materials to support subject.  Instructor reviews cardiac drug classes: antiplatelets, anticoagulants, beta blockers, and statins.  Instructor discusses reasons, side effects, and lifestyle considerations for each drug  class.   Cardiac Drugs II:  -Group instruction provided by verbal instruction and written materials to support subject.  Instructor reviews cardiac drug classes: angiotensin converting enzyme inhibitors (ACE-I), angiotensin II receptor blockers (ARBs), nitrates, and calcium channel blockers.  Instructor discusses reasons, side effects, and lifestyle considerations for each drug class.   Anatomy and Physiology of the Circulatory System:  Group verbal and written instruction and models provide basic cardiac anatomy and physiology, with the coronary electrical and arterial systems. Review of: AMI, Angina, Valve disease, Heart Failure, Peripheral Artery Disease, Cardiac Arrhythmia, Pacemakers, and the ICD.   Other Education:  -Group or individual verbal, written, or video instructions that support the educational goals of the cardiac rehab program.   Holiday Eating Survival Tips:  -Group instruction provided by PowerPoint slides, verbal discussion, and written materials to support subject matter. The instructor gives patients tips, tricks, and techniques to help them not only survive but enjoy the holidays despite the onslaught of food that accompanies the holidays.   Knowledge Questionnaire Score: Knowledge Questionnaire Score - 06/05/18 1017      Knowledge Questionnaire Score   Pre Score  15/24       Core Components/Risk Factors/Patient Goals at Admission: Personal Goals and Risk Factors at Admission - 06/05/18 1029      Core Components/Risk Factors/Patient Goals on Admission    Weight Management  Yes;Weight Gain    Intervention  Weight Management: Develop a combined nutrition and exercise program designed to reach desired caloric intake, while maintaining appropriate intake of nutrient and fiber, sodium and fats, and appropriate energy expenditure required for the weight goal.;Weight Management: Provide education and appropriate resources to help participant work on and attain dietary  goals.    Admit Weight  158 lb 11.7 oz (72 kg)    Goal Weight: Long Term  174 lb (78.9 kg)    Expected Outcomes  Short Term: Continue to assess and modify interventions until short term weight is achieved;Long Term: Adherence to nutrition and physical activity/exercise program aimed toward attainment of established weight goal;Weight Gain: Understanding of general recommendations for a high calorie, high protein meal plan that promotes weight gain by distributing calorie intake throughout the day with the consumption for 4-5 meals, snacks, and/or supplements;Understanding recommendations for meals to include 15-35% energy as protein, 25-35% energy from fat, 35-60% energy from carbohydrates, less than 200mg  of dietary cholesterol, 20-35 gm of total fiber daily;Understanding  of distribution of calorie intake throughout the day with the consumption of 4-5 meals/snacks    Hypertension  Yes    Intervention  Provide education on lifestyle modifcations including regular physical activity/exercise, weight management, moderate sodium restriction and increased consumption of fresh fruit, vegetables, and low fat dairy, alcohol moderation, and smoking cessation.;Monitor prescription use compliance.    Expected Outcomes  Short Term: Continued assessment and intervention until BP is < 140/25mm HG in hypertensive participants. < 130/69mm HG in hypertensive participants with diabetes, heart failure or chronic kidney disease.;Long Term: Maintenance of blood pressure at goal levels.       Core Components/Risk Factors/Patient Goals Review:    Core Components/Risk Factors/Patient Goals at Discharge (Final Review):    ITP Comments: ITP Comments    Row Name 06/05/18 0748           ITP Comments  Medical Director- Dr. Fransico Him, MD          Comments: Decline attended orientation from [phone] to [phone] to review rules and guidelines for program. Completed 6 minute walk test, Intitial ITP, and exercise prescription.   VSS. Telemetry-Sinus Rhythm .  Asymptomatic.Barnet Pall, RN,BSN 06/05/2018 11:09 AM

## 2018-06-08 ENCOUNTER — Other Ambulatory Visit: Payer: Self-pay

## 2018-06-08 ENCOUNTER — Ambulatory Visit (INDEPENDENT_AMBULATORY_CARE_PROVIDER_SITE_OTHER): Payer: Medicare Other | Admitting: Cardiology

## 2018-06-08 ENCOUNTER — Other Ambulatory Visit: Payer: Self-pay | Admitting: Cardiology

## 2018-06-08 DIAGNOSIS — Z5181 Encounter for therapeutic drug level monitoring: Secondary | ICD-10-CM

## 2018-06-08 DIAGNOSIS — Z9889 Other specified postprocedural states: Secondary | ICD-10-CM | POA: Diagnosis not present

## 2018-06-08 DIAGNOSIS — Z7901 Long term (current) use of anticoagulants: Secondary | ICD-10-CM | POA: Diagnosis not present

## 2018-06-08 LAB — POCT INR: INR: 1.4 — AB (ref 2.0–3.0)

## 2018-06-08 MED ORDER — METOPROLOL TARTRATE 25 MG PO TABS: 25.0000 mg | ORAL_TABLET | Freq: Two times a day (BID) | ORAL | 1 refills | Status: DC

## 2018-06-08 MED ORDER — ATORVASTATIN CALCIUM 80 MG PO TABS: 80.0000 mg | ORAL_TABLET | Freq: Every day | ORAL | 2 refills | Status: DC

## 2018-06-08 NOTE — Progress Notes (Signed)
Dose increased to 5 mg M W F Sa, 2. Mg all other days.  Goal 2-2.5 as per CVTS recommendations.

## 2018-06-11 ENCOUNTER — Telehealth (HOSPITAL_COMMUNITY): Payer: Self-pay | Admitting: *Deleted

## 2018-06-11 NOTE — Telephone Encounter (Signed)
Pt called to update about closure of cardiac rehab for two weeks.  Pt was previously scheduled to start 06/15/2018.  This is tentatively postponed until 06/25/2018. VM left for patient.

## 2018-06-13 ENCOUNTER — Telehealth (HOSPITAL_COMMUNITY): Payer: Self-pay

## 2018-06-13 NOTE — Progress Notes (Signed)
I have reviewed a Home Exercise Prescription with [name] . Tami Wood is currently exercising at home.  The patient was advised to walk 5-7 days a week for 30-45 minutes.  Haset and I discussed how to progress their exercise prescription.  The patient stated that their goals were to walk at home.  The patient stated that they understand the exercise prescription.  We reviewed exercise guidelines, target heart rate during exercise, RPE Scale, weather conditions, NTG use, endpoints for exercise, warmup and cool down.  Patient is encouraged to come to me with any questions. I will continue to follow up with the patient to assist them with progression and safety.    06/13/2018 1:33 PM  Deitra Mayo BS, ACSM CEP

## 2018-06-13 NOTE — Telephone Encounter (Signed)
Phone call made to Pt to discuss home exercise guidelines due to closure of Cardiac Rehab for [place]. Pt was responsive and understands goals. Will send Pt home exercise documents via mail.

## 2018-06-15 ENCOUNTER — Other Ambulatory Visit: Payer: Self-pay

## 2018-06-15 ENCOUNTER — Ambulatory Visit (HOSPITAL_COMMUNITY): Payer: Medicare Other

## 2018-06-15 ENCOUNTER — Ambulatory Visit (INDEPENDENT_AMBULATORY_CARE_PROVIDER_SITE_OTHER): Payer: Medicare Other | Admitting: Cardiology

## 2018-06-15 DIAGNOSIS — Z5181 Encounter for therapeutic drug level monitoring: Secondary | ICD-10-CM | POA: Diagnosis not present

## 2018-06-15 DIAGNOSIS — Z7901 Long term (current) use of anticoagulants: Secondary | ICD-10-CM | POA: Diagnosis not present

## 2018-06-15 DIAGNOSIS — Z9889 Other specified postprocedural states: Secondary | ICD-10-CM

## 2018-06-15 LAB — POCT INR
INR: 1.4 — AB (ref 2.0–3.0)
INR: 1.4 — AB (ref 2.0–3.0)

## 2018-06-16 NOTE — Progress Notes (Signed)
Increase warfarin to 5 mg daily. Repeat INR in 3 weeks.

## 2018-06-18 ENCOUNTER — Ambulatory Visit (HOSPITAL_COMMUNITY): Payer: Medicare Other

## 2018-06-18 ENCOUNTER — Telehealth: Payer: Self-pay

## 2018-06-19 ENCOUNTER — Encounter (HOSPITAL_COMMUNITY): Payer: Self-pay | Admitting: Cardiac Rehabilitation

## 2018-06-19 DIAGNOSIS — Z9889 Other specified postprocedural states: Secondary | ICD-10-CM

## 2018-06-19 DIAGNOSIS — Z951 Presence of aortocoronary bypass graft: Secondary | ICD-10-CM

## 2018-06-19 NOTE — Telephone Encounter (Signed)
Pt called stating that this morning her eyes were blurry she had to put her head between her legs; Pt also c/o having panic attacks in the evenings

## 2018-06-19 NOTE — Progress Notes (Signed)
Cardiac Individual Treatment Plan  Patient Details  Name: Tami Wood MRN: [id number] Date of Birth: December 28, 1950 Referring Provider:   Flowsheet Row CARDIAC REHAB PHASE II ORIENTATION from 06/05/2018 in [place]  Referring Provider  Patwardhan, Reynold Bowen, MD      Initial Encounter Date:  [place] from 06/05/2018 in [place]  Date  06/05/18      Visit Diagnosis: 04/10/2018 CABG x2  04/10/2018 MV Repair  Patient's Home Medications on Admission:  Current Outpatient Medications:  .  amoxicillin (AMOXIL) 500 MG capsule, Take 2 capsules (1,000 mg total) by mouth 2 (two) times daily. (Patient taking differently: Take 2,000 mg by mouth See admin instructions. For Dental Procedures), Disp: 40 capsule, Rfl: 0 .  aspirin 81 MG EC tablet, Take 81 mg by mouth daily.  , Disp: , Rfl:  .  atorvastatin (LIPITOR) 80 MG tablet, Take 1 tablet (80 mg total) by mouth daily at 6 PM., Disp: 90 tablet, Rfl: 2 .  metoprolol tartrate (LOPRESSOR) 25 MG tablet, TAKE 1 TABLET(25 MG) BY MOUTH TWICE DAILY, Disp: 180 tablet, Rfl: 0 .  spironolactone (ALDACTONE) 50 MG tablet, Take 50 mg by mouth daily., Disp: , Rfl:  .  warfarin (COUMADIN) 1 MG tablet, Take 2.5 tablets (2.5 mg total) by mouth daily at 6 PM. As directed by coumadin clinic, Disp: 150 tablet, Rfl: 1  Past Medical History: Past Medical History:  Diagnosis Date  . ALLERGIC RHINITIS 06/18/2009  . ANEMIA-IRON DEFICIENCY 11/28/2006  . ANXIETY 11/28/2006  . Arthritis   . Coronary artery disease   . Cough 06/18/2009  . DEPRESSION 11/28/2006  . GERD 12/10/2007  . History of kidney stones   . HYPERTENSION 11/28/2006  . Mitral regurgitation   . MURMUR 11/28/2006  . PEPTIC ULCER DISEASE 12/10/2007  . 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 S/P mitral valve repair 04/10/2018   Complex valvuloplasty including artificial Gore-tex  neochord placement x8 and Sorin Memo 4D ring annuloplasty, size 28    Tobacco Use: Social History   Tobacco Use  Smoking Status Never Smoker  Smokeless Tobacco Never Used    Labs: Recent Review Flowsheet Data    Labs for ITP Cardiac and Pulmonary Rehab Latest Ref Rng & Units 04/10/2018 04/10/2018 04/10/2018 04/10/2018 04/11/2018   Cholestrol 0 - 200 mg/dL - - - - -   LDLCALC 0 - 99 mg/dL - - - - -   HDL >39.00 mg/dL - - - - -   Trlycerides 0.0 - 149.0 mg/dL - - - - -   Hemoglobin A1c 4.8 - 5.6 % - - - - -   PHART 7.350 - 7.450 7.378 7.293(L) - 7.319(L) -   PCO2ART 32.0 - 48.0 mmHg 38.2 42.6 - 45.9 -   HCO3 20.0 - 28.0 mmol/L 23.1 20.8 - 23.8 -   TCO2 22 - 32 mmol/L 24 22 25 25 23    ACIDBASEDEF 0.0 - 2.0 mmol/L 2.0 5.0(H) - 2.0 -   O2SAT % 99.0 99.0 - 99.0 -      Capillary Blood Glucose: Lab Results  Component Value Date   GLUCAP 136 (H) 04/12/2018   GLUCAP 109 (H) 04/12/2018   GLUCAP 130 (H) 04/11/2018   GLUCAP 153 (H) 04/11/2018   GLUCAP 166 (H) 04/11/2018     Exercise Target Goals: Exercise Program Goal: Individual exercise prescription set using results  from initial 6 min walk test and THRR while considering  patient's activity barriers and safety.   Exercise Prescription Goal: Initial exercise prescription builds to 30-45 minutes a day of aerobic activity, 2-3 days per week.  Home exercise guidelines will be given to patient during program as part of exercise prescription that the participant will acknowledge.  Activity Barriers & Risk Stratification: Activity Barriers & Cardiac Risk Stratification - 06/05/18 0827    Activity Barriers & Cardiac Risk Stratification          Activity Barriers  Arthritis;Other (comment)    Comments  Right knee-arthritis    Cardiac Risk Stratification  High           6 Minute Walk: 6 Minute Walk    6 Minute Walk    Row Name 06/05/18 0812   Phase  Initial   Distance  1453 feet   Walk Time  6 minutes   # of Rest Breaks  0    MPH  2.75   METS  3.17   RPE  7   Perceived Dyspnea   0   VO2 Peak  11.08   Symptoms  No   Resting HR  68 bpm   Resting BP  122/70   Resting Oxygen Saturation   99 %   Exercise Oxygen Saturation  during 6 min walk  97 %   Max Ex. HR  86 bpm   Max Ex. BP  140/82   2 Minute Post BP  124/68          Oxygen Initial Assessment:   Oxygen Re-Evaluation:   Oxygen Discharge (Final Oxygen Re-Evaluation):   Initial Exercise Prescription: Initial Exercise Prescription - 06/05/18 1000    Date of Initial Exercise RX and Referring Provider          Date  06/05/18    Referring Provider  Nigel Mormon, MD    Expected Discharge Date  09/10/18        Recumbant Bike          Level  2    Watts  27    Minutes  10    METs  3.2        NuStep          Level  2    SPM  85    Minutes  10    METs  2.7        Track          Laps  12    Minutes  10    METs  3.09        Prescription Details          Frequency (times per week)  3    Duration  Progress to 30 minutes of continuous aerobic without signs/symptoms of physical distress        Intensity          THRR 40-80% of Max Heartrate  60-121    Ratings of Perceived Exertion  11-13    Perceived Dyspnea  0-4        Progression          Progression  Continue to progress workloads to maintain intensity without signs/symptoms of physical distress.        Resistance Training          Training Prescription  Yes    Weight  2lbs    Reps  10-15           Perform Capillary  Blood Glucose checks as needed.  Exercise Prescription Changes: Exercise Prescription Changes    Home Exercise Plan    [place] Name 06/13/18 1300   Plans to continue exercise at  Home (comment)   Frequency  Add 4 additional days to program exercise sessions.   Initial Home Exercises Provided  06/13/18          Exercise Comments: Exercise Comments    Row Name 06/13/18 1335   Exercise Comments  Reviwed HEP with Pt. Pt was responsive and  understands goals.       Exercise Goals and Review: Exercise Goals    Exercise Goals    Row Name 06/05/18 0827   Increase Physical Activity  Yes   Intervention  Provide advice, education, support and counseling about physical activity/exercise needs.;Develop an individualized exercise prescription for aerobic and resistive training based on initial evaluation findings, risk stratification, comorbidities and participant's personal goals.   Expected Outcomes  Short Term: Attend rehab on a regular basis to increase amount of physical activity.;Long Term: Add in home exercise to make exercise part of routine and to increase amount of physical activity.;Long Term: Exercising regularly at least 3-5 days a week.   Increase Strength and Stamina  Yes   Intervention  Develop an individualized exercise prescription for aerobic and resistive training based on initial evaluation findings, risk stratification, comorbidities and participant's personal goals.;Provide advice, education, support and counseling about physical activity/exercise needs.   Expected Outcomes  Short Term: Increase workloads from initial exercise prescription for resistance, speed, and METs.;Short Term: Perform resistance training exercises routinely during rehab and add in resistance training at home;Long Term: Improve cardiorespiratory fitness, muscular endurance and strength as measured by increased METs and functional capacity (6MWT)   Able to understand and use rate of perceived exertion (RPE) scale  Yes   Intervention  Provide education and explanation on how to use RPE scale   Expected Outcomes  Short Term: Able to use RPE daily in rehab to express subjective intensity level;Long Term:  Able to use RPE to guide intensity level when exercising independently   Knowledge and understanding of Target Heart Rate Range (THRR)  Yes   Intervention  Provide education and explanation of THRR including how the numbers were predicted and where  they are located for reference   Expected Outcomes  Long Term: Able to use THRR to govern intensity when exercising independently;Short Term: Able to state/look up THRR;Short Term: Able to use daily as guideline for intensity in rehab   Able to check pulse independently  Yes   Intervention  Provide education and demonstration on how to check pulse in carotid and radial arteries.;Review the importance of being able to check your own pulse for safety during independent exercise   Expected Outcomes  Short Term: Able to explain why pulse checking is important during independent exercise;Long Term: Able to check pulse independently and accurately   Understanding of Exercise Prescription  Yes   Intervention  Provide education, explanation, and written materials on patient's individual exercise prescription   Expected Outcomes  Short Term: Able to explain program exercise prescription;Long Term: Able to explain home exercise prescription to exercise independently          Exercise Goals Re-Evaluation : Exercise Goals Re-Evaluation    Exercise Goal Re-Evaluation    Row Name 06/13/18 1334   Exercise Goals Review  Increase Physical Activity;Increase Strength and Stamina;Able to understand and use rate of perceived exertion (RPE) scale;Knowledge and understanding of Target Heart Rate Range (  THRR);Understanding of Exercise Prescription   Comments  Reviewed HEP with Pt. Pt was responsive and understands THRR, RPE scale, weather precautions, and end points of exercise. Pt is walking at home 4-5 days per week for 30-45 minutes.    Expected Outcomes  Will continue to monitor and progress Pt as tolerated.           Discharge Exercise Prescription (Final Exercise Prescription Changes): Exercise Prescription Changes - 06/13/18 1300    Home Exercise Plan          Plans to continue exercise at  Home (comment)    Frequency  Add 4 additional days to program exercise sessions.    Initial Home Exercises  Provided  06/13/18           Nutrition:  Target Goals: Understanding of nutrition guidelines, daily intake of sodium 1500mg , cholesterol 200mg , calories 30% from fat and 7% or less from saturated fats, daily to have 5 or more servings of fruits and vegetables.  Biometrics: Pre Biometrics - 06/05/18 0745    Pre Biometrics          Height  5\' 5"  (1.651 m)    Weight  158 lb 11.7 oz (72 kg)    Waist Circumference  32.5 inches    Hip Circumference  41.5 inches    Waist to Hip Ratio  0.78 %    BMI (Calculated)  26.41    Triceps Skinfold  19 mm    % Body Fat  35.3 %    Grip Strength  27 kg    Flexibility  14 in    Single Leg Stand  3.31 seconds            Nutrition Therapy Plan and Nutrition Goals: Nutrition Therapy & Goals - 06/18/18 1221    Nutrition Therapy          Diet  Heart healthy        Personal Nutrition Goals          Nutrition Goal  Pt to identify and limit food sources of sodium, saturated fat, trans fat, and refined carbohydrates        Intervention Plan          Intervention  Prescribe, educate and counsel regarding individualized specific dietary modifications aiming towards targeted core components such as weight, hypertension, lipid management, diabetes, heart failure and other comorbidities.    Expected Outcomes  Short Term Goal: Understand basic principles of dietary content, such as calories, fat, sodium, cholesterol and nutrients.;Long Term Goal: Adherence to prescribed nutrition plan.           Nutrition Assessments: Nutrition Assessments - 06/18/18 1221    MEDFICTS Scores          Pre Score  --   pt did not complete survey at orientation          Nutrition Goals Re-Evaluation:   Nutrition Goals Re-Evaluation:   Nutrition Goals Discharge (Final Nutrition Goals Re-Evaluation):   Psychosocial: Target Goals: Acknowledge presence or absence of significant depression and/or stress, maximize coping skills, provide positive support  system. Participant is able to verbalize types and ability to use techniques and skills needed for reducing stress and depression.  Initial Review & Psychosocial Screening: Initial Psych Review & Screening - 06/05/18 1057    Initial Review          Current issues with  None Identified        Family Dynamics  Good Support System?  Yes   Decline has her family for support       Barriers          Psychosocial barriers to participate in program  There are no identifiable barriers or psychosocial needs.        Screening Interventions          Interventions  Encouraged to exercise           Quality of Life Scores: Quality of Life - 06/05/18 1017    Quality of Life          Select  Quality of Life        Quality of Life Scores          Health/Function Pre  28.47 %    Socioeconomic Pre  29.29 %    Psych/Spiritual Pre  29.14 %    Family Pre  30 %    GLOBAL Pre  28.97 %          Scores of 19 and below usually indicate a poorer quality of life in these areas.  A difference of  2-3 points is a clinically meaningful difference.  A difference of 2-3 points in the total score of the Quality of Life Index has been associated with significant improvement in overall quality of life, self-image, physical symptoms, and general health in studies assessing change in quality of life.  PHQ-9: Recent Review Flowsheet Data    Depression screen [facility] 2/9 04/09/2018 08/16/2017 07/01/2016 03/09/2016 11/06/2014   Decreased Interest 0 0 0 0 0   Down, Depressed, Hopeless 0 0 0 0 0   PHQ - 2 Score 0 0 0 0 0     Interpretation of Total Score  Total Score Depression Severity:  1-4 = Minimal depression, 5-9 = Mild depression, 10-14 = Moderate depression, 15-19 = Moderately severe depression, 20-27 = Severe depression   Psychosocial Evaluation and Intervention:   Psychosocial Re-Evaluation:   Psychosocial Discharge (Final Psychosocial Re-Evaluation):   Vocational  Rehabilitation: Provide vocational rehab assistance to qualifying candidates.   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 Participant will state understanding/return demonstration of topics presented.  Learning Barriers/Preferences: Learning Barriers/Preferences - 06/05/18 1019    Learning Barriers/Preferences          Learning Barriers  Sight;Hearing    Learning Preferences  Skilled Demonstration;Written Material           Education Topics: Count Your Pulse:  -Group instruction provided by verbal instruction, demonstration, patient participation and written materials to support subject.  Instructors address importance of being able to find your pulse and how to count your pulse when at home without a heart monitor.  Patients get hands on experience counting their pulse with staff help and individually.   Heart Attack, Angina, and Risk Factor Modification:  -Group instruction provided by verbal instruction, video, and written materials to support subject.  Instructors address signs and symptoms of angina and heart attacks.    Also discuss risk factors for heart disease and how to make changes to improve heart health risk factors.  Functional Fitness:  -Group instruction provided by verbal instruction, demonstration, patient participation, and written materials to support subject.  Instructors address safety measures for doing things around the house.  Discuss how to get up and down off the floor, how to pick things up properly, how to safely get out of a chair without assistance, and balance training.   Meditation and Mindfulness:   -Group instruction provided by verbal instruction, patient participation, and written materials to support subject.  Instructor addresses importance of mindfulness and meditation practice to help reduce stress and improve awareness.  Instructor also leads participants through a meditation exercise.    Stretching for Flexibility and Mobility:  -Group instruction provided by verbal instruction, patient participation, and written materials to support subject.  Instructors lead participants through series of stretches that are designed to increase flexibility thus improving mobility.  These stretches are additional exercise for major muscle groups that are typically performed during regular warm up and cool down.   Hands Only CPR:  -Group verbal, video, and participation provides a basic overview of AHA guidelines for community CPR. Role-play of emergencies allow participants the opportunity to practice calling for help and chest compression technique with discussion of AED use.   Hypertension: -Group verbal and written instruction that provides a basic overview of hypertension including the most recent diagnostic guidelines, risk factor reduction with self-care instructions and medication management.    Nutrition I class: Heart Healthy Eating:  -Group instruction provided by PowerPoint slides, verbal discussion, and written materials to support subject matter. The instructor gives an explanation and review of the Therapeutic Lifestyle Changes diet recommendations, which includes a discussion on lipid goals, dietary fat, sodium, fiber, plant stanol/sterol esters, sugar, and the components of a well-balanced, healthy diet.   Nutrition II class: Lifestyle Skills:  -Group instruction provided by PowerPoint slides, verbal discussion, and written materials to support subject matter. The instructor gives an explanation and review of label reading, grocery shopping for heart health, heart healthy recipe  modifications, and ways to make healthier choices when eating out.   Diabetes Question & Answer:  -Group instruction provided by PowerPoint slides, verbal discussion, and written materials to support subject matter. The instructor gives an explanation and review of diabetes co-morbidities, pre- and post-prandial blood glucose goals, pre-exercise blood glucose goals, signs, symptoms, and treatment of hypoglycemia and hyperglycemia, and foot care basics.   Diabetes Blitz:  -Group instruction provided by PowerPoint slides, verbal discussion, and written materials to support subject matter. The instructor gives an explanation and review of the physiology behind type 1 and type 2 diabetes, diabetes medications and rational behind using different medications, pre- and post-prandial blood glucose recommendations and Hemoglobin A1c goals, diabetes diet, and exercise including blood glucose guidelines for exercising safely.    Portion Distortion:  -Group instruction provided by PowerPoint slides, verbal discussion, written materials, and food models to support subject matter. The instructor gives an explanation of serving size versus portion size, changes in portions sizes over the last 20 years, and what consists of a serving from each food group.   Stress Management:  -Group instruction provided by verbal instruction, video, and written materials to support subject matter.  Instructors review role of stress in heart disease and how to cope with stress positively.     Exercising on Your Own:  -Group instruction provided by verbal instruction, power point, and written materials to support subject.  Instructors discuss benefits of exercise, components of exercise, frequency and intensity of exercise, and end points  for exercise.  Also discuss use of nitroglycerin and activating EMS.  Review options of places to exercise outside of rehab.  Review guidelines for sex with heart disease.   Cardiac Drugs I:   -Group instruction provided by verbal instruction and written materials to support subject.  Instructor reviews cardiac drug classes: antiplatelets, anticoagulants, beta blockers, and statins.  Instructor discusses reasons, side effects, and lifestyle considerations for each drug class.   Cardiac Drugs II:  -Group instruction provided by verbal instruction and written materials to support subject.  Instructor reviews cardiac drug classes: angiotensin converting enzyme inhibitors (ACE-I), angiotensin II receptor blockers (ARBs), nitrates, and calcium channel blockers.  Instructor discusses reasons, side effects, and lifestyle considerations for each drug class.   Anatomy and Physiology of the Circulatory System:  Group verbal and written instruction and models provide basic cardiac anatomy and physiology, with the coronary electrical and arterial systems. Review of: AMI, Angina, Valve disease, Heart Failure, Peripheral Artery Disease, Cardiac Arrhythmia, Pacemakers, and the ICD.   Other Education:  -Group or individual verbal, written, or video instructions that support the educational goals of the cardiac rehab program.   Holiday Eating Survival Tips:  -Group instruction provided by PowerPoint slides, verbal discussion, and written materials to support subject matter. The instructor gives patients tips, tricks, and techniques to help them not only survive but enjoy the holidays despite the onslaught of food that accompanies the holidays.   Knowledge Questionnaire Score: Knowledge Questionnaire Score - 06/05/18 1017    Knowledge Questionnaire Score          Pre Score  15/24           Core Components/Risk Factors/Patient Goals at Admission: Personal Goals and Risk Factors at Admission - 06/05/18 1029    Core Components/Risk Factors/Patient Goals on Admission           Weight Management  Yes;Weight Gain    Intervention  Weight Management: Develop a combined nutrition and exercise  program designed to reach desired caloric intake, while maintaining appropriate intake of nutrient and fiber, sodium and fats, and appropriate energy expenditure required for the weight goal.;Weight Management: Provide education and appropriate resources to help participant work on and attain dietary goals.    Admit Weight  158 lb 11.7 oz (72 kg)    Goal Weight: Long Term  174 lb (78.9 kg)    Expected Outcomes  Short Term: Continue to assess and modify interventions until short term weight is achieved;Long Term: Adherence to nutrition and physical activity/exercise program aimed toward attainment of established weight goal;Weight Gain: Understanding of general recommendations for a high calorie, high protein meal plan that promotes weight gain by distributing calorie intake throughout the day with the consumption for 4-5 meals, snacks, and/or supplements;Understanding recommendations for meals to include 15-35% energy as protein, 25-35% energy from fat, 35-60% energy from carbohydrates, less than 200mg  of dietary cholesterol, 20-35 gm of total fiber daily;Understanding of distribution of calorie intake throughout the day with the consumption of 4-5 meals/snacks    Hypertension  Yes    Intervention  Provide education on lifestyle modifcations including regular physical activity/exercise, weight management, moderate sodium restriction and increased consumption of fresh fruit, vegetables, and low fat dairy, alcohol moderation, and smoking cessation.;Monitor prescription use compliance.    Expected Outcomes  Short Term: Continued assessment and intervention until BP is < 140/22mm HG in hypertensive participants. < 130/7mm HG in hypertensive participants with diabetes, heart failure or chronic kidney disease.;Long Term: Maintenance of blood pressure at  goal levels.           Core Components/Risk Factors/Patient Goals Review:    Core Components/Risk Factors/Patient Goals at Discharge (Final Review):     ITP Comments: ITP Comments    Row Name 06/05/18 0748 06/19/18 1509   ITP Comments  Medical Director- Dr. Fransico Him, MD  30 day ITP. pt currently on hold for CR dept closure as part of COVID 19 precautions.       Comments:  Andi Hence, RN, BSN Cardiac Pulmonary Rehab

## 2018-06-19 NOTE — Telephone Encounter (Signed)
Spoke with the patient. Patient is asymptomatic now. Do not suspect acute emergent issue. I would avoid ED visit unless absolutely need to, in the current COVID-19 circumstances. Recommend regular BP checks at home.

## 2018-06-20 ENCOUNTER — Ambulatory Visit (HOSPITAL_COMMUNITY): Payer: Medicare Other

## 2018-06-21 ENCOUNTER — Telehealth (HOSPITAL_COMMUNITY): Payer: Self-pay

## 2018-06-21 NOTE — Telephone Encounter (Signed)
Phone call made to Pt in regards to Cardiac Rehab closure for an additional four weeks with an expected reopen date of 07/16/18. This is due to [place]. Pt was responsive and understands information.

## 2018-06-22 ENCOUNTER — Ambulatory Visit (HOSPITAL_COMMUNITY): Payer: Medicare Other

## 2018-06-22 ENCOUNTER — Other Ambulatory Visit: Payer: Self-pay

## 2018-06-22 DIAGNOSIS — I340 Nonrheumatic mitral (valve) insufficiency: Secondary | ICD-10-CM

## 2018-06-22 MED ORDER — WARFARIN SODIUM 1 MG PO TABS: 2.5000 mg | ORAL_TABLET | Freq: Every day | ORAL | 1 refills | Status: DC

## 2018-06-25 ENCOUNTER — Ambulatory Visit (HOSPITAL_COMMUNITY): Payer: Medicare Other

## 2018-06-27 ENCOUNTER — Ambulatory Visit (HOSPITAL_COMMUNITY): Payer: Medicare Other

## 2018-06-29 ENCOUNTER — Ambulatory Visit (HOSPITAL_COMMUNITY): Payer: Medicare Other

## 2018-07-02 ENCOUNTER — Ambulatory Visit (HOSPITAL_COMMUNITY): Payer: Medicare Other

## 2018-07-04 ENCOUNTER — Telehealth (HOSPITAL_COMMUNITY): Payer: Self-pay | Admitting: Cardiac Rehabilitation

## 2018-07-04 ENCOUNTER — Ambulatory Visit (HOSPITAL_COMMUNITY): Payer: Medicare Other

## 2018-07-04 NOTE — Telephone Encounter (Signed)
Pt phone call to inform of continued Outpatient Cardiac Rehab departmental closure for COVID 19 precautions. Future opening date to be determined.  Pt instructed to continue exercising on her  own following home exercise guidelines. Pt advised to contact cardiology or PCP PRN symptoms, questions or concerns. Understanding verbalized.   Pt denies food insecurity or other needs at this time, her sisters are helping her.   Pt offered emotional support and understanding. Pt expressed gratitude for the  call and is interested in resuming  CRPII when available.  Andi Hence, RN, BSN Cardiac Pulmonary Rehab

## 2018-07-05 ENCOUNTER — Ambulatory Visit (INDEPENDENT_AMBULATORY_CARE_PROVIDER_SITE_OTHER): Payer: Medicare Other | Admitting: Cardiology

## 2018-07-05 ENCOUNTER — Other Ambulatory Visit: Payer: Self-pay

## 2018-07-05 DIAGNOSIS — Z7901 Long term (current) use of anticoagulants: Secondary | ICD-10-CM | POA: Diagnosis not present

## 2018-07-05 DIAGNOSIS — Z9889 Other specified postprocedural states: Secondary | ICD-10-CM

## 2018-07-05 DIAGNOSIS — Z5181 Encounter for therapeutic drug level monitoring: Secondary | ICD-10-CM | POA: Diagnosis not present

## 2018-07-05 DIAGNOSIS — I2510 Atherosclerotic heart disease of native coronary artery without angina pectoris: Secondary | ICD-10-CM | POA: Diagnosis not present

## 2018-07-05 LAB — POCT INR
INR: 2.7 (ref 2.0–3.0)
INR: 2.7 (ref 2.0–3.0)

## 2018-07-06 ENCOUNTER — Ambulatory Visit (HOSPITAL_COMMUNITY): Payer: Medicare Other

## 2018-07-09 ENCOUNTER — Ambulatory Visit (HOSPITAL_COMMUNITY): Payer: Medicare Other

## 2018-07-10 ENCOUNTER — Telehealth: Payer: Self-pay

## 2018-07-10 NOTE — Telephone Encounter (Signed)
Not sure how this could be the case, since no CT or MRI was done in January, and thyroid does not show on regular xrays, and I see no mention of this on the d/c summary for her jan 15,  So unless she has other information to correct the initial phone message, I dont see where she would need to see a thyroid specialist

## 2018-07-10 NOTE — Telephone Encounter (Signed)
Pt stated that she would call the office where she went. She also mentioned that it was no documentation. She said the person who did her xray had mentioned it to her but did not document it. Pt will call back with an update once she speaks with that office.

## 2018-07-10 NOTE — Telephone Encounter (Signed)
Please advise.  Copied from [place] [phone]. Topic: Referral - Request for Referral >> Jul 10, 2018  9:03 AM Tami Wood wrote: Patient said she had open heart surgery back in January and they found a nodule on her thyroid when they did x-rays. She said she needs a referral from Dr Jenny Reichmann to a thyroid specialist.

## 2018-07-11 ENCOUNTER — Ambulatory Visit (HOSPITAL_COMMUNITY): Payer: Medicare Other

## 2018-07-11 NOTE — Telephone Encounter (Signed)
Patient called back and is requesting to come in and have x rays done by [place]. She states she needs something done about her thyroid. Can you please follow up with patient. I am unsure how to help her understand what is going on.

## 2018-07-11 NOTE — Telephone Encounter (Signed)
Dr. Denice Bors-  Pt has been informed again from details below per PCP that since it is no documentation we can not move forward with a referral to a specialist. I informed the patient she can set up a video visit with PCP since we are not currently seeing pt's in the office due to covid-19 to discuss her concerns and reasoning to have valid documentation in order to move forward. She stated "I'm on the other line on hold with [facility] and I'll have to call back to do the video visit". She then went on to say "you're saying nothing can be done and I should forget about it". I once again explained to the patient that we need reasoning and documentation to move forward with a scan since a thyroid does not show clearly on an xray. She somewhat expressed understanding, said ok and we disconnected.

## 2018-07-13 ENCOUNTER — Ambulatory Visit (HOSPITAL_COMMUNITY): Payer: Medicare Other

## 2018-07-16 ENCOUNTER — Ambulatory Visit (HOSPITAL_COMMUNITY): Payer: Medicare Other

## 2018-07-18 ENCOUNTER — Ambulatory Visit (HOSPITAL_COMMUNITY): Payer: Medicare Other

## 2018-07-19 ENCOUNTER — Other Ambulatory Visit: Payer: Self-pay

## 2018-07-19 ENCOUNTER — Ambulatory Visit (INDEPENDENT_AMBULATORY_CARE_PROVIDER_SITE_OTHER): Payer: Medicare Other | Admitting: Cardiology

## 2018-07-19 DIAGNOSIS — Z5181 Encounter for therapeutic drug level monitoring: Secondary | ICD-10-CM | POA: Diagnosis not present

## 2018-07-19 DIAGNOSIS — Z7901 Long term (current) use of anticoagulants: Secondary | ICD-10-CM

## 2018-07-19 DIAGNOSIS — Z9889 Other specified postprocedural states: Secondary | ICD-10-CM

## 2018-07-19 LAB — POCT INR
INR: 2.1 (ref 2.0–3.0)
INR: 2.1 (ref 2.0–3.0)

## 2018-07-20 ENCOUNTER — Ambulatory Visit (HOSPITAL_COMMUNITY): Payer: Medicare Other

## 2018-07-23 ENCOUNTER — Ambulatory Visit (HOSPITAL_COMMUNITY): Payer: Medicare Other

## 2018-07-25 ENCOUNTER — Ambulatory Visit (HOSPITAL_COMMUNITY): Payer: Medicare Other

## 2018-07-27 ENCOUNTER — Other Ambulatory Visit: Payer: Self-pay

## 2018-07-27 ENCOUNTER — Ambulatory Visit (HOSPITAL_COMMUNITY): Payer: Medicare Other

## 2018-07-27 ENCOUNTER — Encounter: Payer: Self-pay | Admitting: Cardiology

## 2018-07-27 ENCOUNTER — Ambulatory Visit (INDEPENDENT_AMBULATORY_CARE_PROVIDER_SITE_OTHER): Payer: Medicare Other | Admitting: Cardiology

## 2018-07-27 ENCOUNTER — Telehealth: Payer: Self-pay

## 2018-07-27 VITALS — BP 172/95 | HR 71 | Ht 65.0 in | Wt 155.1 lb

## 2018-07-27 DIAGNOSIS — I25810 Atherosclerosis of coronary artery bypass graft(s) without angina pectoris: Secondary | ICD-10-CM

## 2018-07-27 DIAGNOSIS — Z9889 Other specified postprocedural states: Secondary | ICD-10-CM

## 2018-07-27 DIAGNOSIS — I10 Essential (primary) hypertension: Secondary | ICD-10-CM | POA: Diagnosis not present

## 2018-07-27 LAB — POCT INR: INR: 1.9 — AB (ref 2.0–3.0)

## 2018-07-27 MED ORDER — ATORVASTATIN CALCIUM 20 MG PO TABS: 80.0000 mg | ORAL_TABLET | Freq: Every day | ORAL | 3 refills | Status: DC

## 2018-07-27 MED ORDER — WARFARIN SODIUM 5 MG PO TABS: ORAL_TABLET | ORAL | 2 refills | Status: DC

## 2018-07-27 MED ORDER — AMLODIPINE BESYLATE 5 MG PO TABS: 5.0000 mg | ORAL_TABLET | Freq: Every day | ORAL | 3 refills | Status: DC
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
## 2018-07-30 ENCOUNTER — Ambulatory Visit (HOSPITAL_COMMUNITY): Payer: Medicare Other

## 2018-08-01 ENCOUNTER — Ambulatory Visit (HOSPITAL_COMMUNITY): Payer: Medicare Other

## 2018-08-03 ENCOUNTER — Ambulatory Visit (HOSPITAL_COMMUNITY): Payer: Medicare Other

## 2018-08-06 ENCOUNTER — Other Ambulatory Visit: Payer: Self-pay

## 2018-08-06 ENCOUNTER — Ambulatory Visit (INDEPENDENT_AMBULATORY_CARE_PROVIDER_SITE_OTHER): Payer: Medicare Other | Admitting: Cardiology

## 2018-08-06 ENCOUNTER — Ambulatory Visit (HOSPITAL_COMMUNITY): Payer: Medicare Other

## 2018-08-06 DIAGNOSIS — Z9889 Other specified postprocedural states: Secondary | ICD-10-CM

## 2018-08-06 DIAGNOSIS — Z5181 Encounter for therapeutic drug level monitoring: Secondary | ICD-10-CM | POA: Diagnosis not present

## 2018-08-06 DIAGNOSIS — Z7901 Long term (current) use of anticoagulants: Secondary | ICD-10-CM | POA: Diagnosis not present

## 2018-08-06 LAB — POCT INR
INR: 2.4 (ref 2.0–3.0)
INR: 2.4 (ref 2.0–3.0)

## 2018-08-06 MED ORDER — WARFARIN SODIUM 5 MG PO TABS: ORAL_TABLET | ORAL | 3 refills | Status: DC

## 2018-08-06 NOTE — Progress Notes (Signed)
INR 2.4. Continue warfarin 7.5 mg Tue, Fri, 5 mg all other days.  BP better controlled.

## 2018-08-07 ENCOUNTER — Other Ambulatory Visit: Payer: Self-pay

## 2018-08-07 DIAGNOSIS — Z9889 Other specified postprocedural states: Secondary | ICD-10-CM

## 2018-08-07 MED ORDER — WARFARIN SODIUM 5 MG PO TABS: ORAL_TABLET | ORAL | 3 refills | Status: DC

## 2018-08-08 ENCOUNTER — Ambulatory Visit (HOSPITAL_COMMUNITY): Payer: Medicare Other

## 2018-08-10 ENCOUNTER — Ambulatory Visit (HOSPITAL_COMMUNITY): Payer: Medicare Other

## 2018-08-13 ENCOUNTER — Other Ambulatory Visit: Payer: Self-pay

## 2018-08-13 ENCOUNTER — Ambulatory Visit (HOSPITAL_COMMUNITY): Payer: Medicare Other

## 2018-08-13 ENCOUNTER — Telehealth (INDEPENDENT_AMBULATORY_CARE_PROVIDER_SITE_OTHER): Payer: Medicare Other | Admitting: Thoracic Surgery (Cardiothoracic Vascular Surgery)

## 2018-08-13 DIAGNOSIS — Z9889 Other specified postprocedural states: Secondary | ICD-10-CM | POA: Diagnosis not present

## 2018-08-13 DIAGNOSIS — Z951 Presence of aortocoronary bypass graft: Secondary | ICD-10-CM

## 2018-08-13 NOTE — Patient Instructions (Signed)
You may resume unrestricted physical activity without any particular limitations at this time.  Stop taking Coumadin (warfarin).  Otherwise you may continue all previous medications without any changes at this time  Endocarditis is a potentially serious infection of heart valves or inside lining of the heart.  It occurs more commonly in patients with diseased heart valves (such as patient's with aortic or mitral valve disease) and in patients who have undergone heart valve repair or replacement.  Certain surgical and dental procedures may put you at risk, such as dental cleaning, other dental procedures, or any surgery involving the respiratory, urinary, gastrointestinal tract, gallbladder or prostate gland.   To minimize your chances for develooping endocarditis, maintain good oral health and seek prompt medical attention for any infections involving the mouth, teeth, gums, skin or urinary tract.    Always notify your doctor or dentist about your underlying heart valve condition before having any invasive procedures. You will need to take antibiotics before certain procedures, including all routine dental cleanings or other dental procedures.  Your cardiologist or dentist should prescribe these antibiotics for you to be taken ahead of time.

## 2018-08-13 NOTE — Progress Notes (Signed)
[address]       ,[place] [address]             [phone]     CARDIOTHORACIC SURGERY TELEPHONE VIRTUAL OFFICE NOTE  Referring Provider is Patwardhan, Reynold Bowen, MD Primary Cardiologist is No primary care provider on file. PCP is Biagio Borg, MD   HPI:  I spoke with Tami Wood (DOB October 30, 1950 ) via telephone on 08/13/2018 at 10:41 AM and verified that I was speaking with the correct person using more than one form of identification.  We discussed the reason(s) for conducting our visit virtually instead of in-person.  The patient expressed understanding the circumstances and agreed to proceed as described.  Patient is a 68 year old African-American female who underwent mitral valve repair and coronary artery bypass grafting x2 on April 10, 2018 for mitral valve prolapse with severe symptomatic primary mitral regurgitation and multivessel coronary artery disease.  Her early postoperative convalescence in the hospital was uneventful and she was discharged home on the fifth postoperative day.  She was anticoagulated using warfarin for routine prophylaxis related to her mitral valve repair.  She was last seen in our office on May 14, 2018 at which time she was doing very well.  Since then she has been followed carefully by Dr. Virgina Jock.  Because of the ongoing COVID-19 pandemic I spoke with her over the telephone today.  She reports that she is doing very well.  She states that she never really had any significant pain in her chest.  She denies any exertional shortness of breath.  She is walking daily and reports that she is getting along fairly well.  She does complain of chronic anxiety but this is unchanged.  She has not had any problems with Coumadin.  She has been practicing social distancing per guidelines.  She denies any recent fevers, cough, or worsening shortness of breath.    Current Outpatient Medications  Medication Sig Dispense Refill  .  amLODipine (NORVASC) 5 MG tablet Take 1 tablet (5 mg total) by mouth daily. 30 tablet 3  . amoxicillin (AMOXIL) 500 MG capsule Take 2 capsules (1,000 mg total) by mouth 2 (two) times daily. (Patient taking differently: Take 2,000 mg by mouth See admin instructions. For Dental Procedures) 40 capsule 0  . aspirin 81 MG EC tablet Take 81 mg by mouth daily.      Marland Kitchen atorvastatin (LIPITOR) 20 MG tablet Take 4 tablets (80 mg total) by mouth daily at 6 PM. 30 tablet 3  . metoprolol tartrate (LOPRESSOR) 25 MG tablet TAKE 1 TABLET(25 MG) BY MOUTH TWICE DAILY 180 tablet 0  . spironolactone (ALDACTONE) 50 MG tablet Take 50 mg by mouth daily.    Marland Kitchen warfarin (COUMADIN) 5 MG tablet As directed by coumadin clinic  Directions as of 07/27/2018. Please note that this is subject to change in the future.  Take 1 pill on Monday, Wednesday, Thursday, Saturday, Sunday  Take 1 1/2 pills on Tuesday, Friday 60 tablet 3   No current facility-administered medications for this visit.      Diagnostic Tests:  n/a   Impression:  Patient seems to be doing very well more than 3 months status post mitral valve repair and coronary artery bypass grafting  Plan:  I think it would be reasonable for the patient to stop taking Coumadin (warfarin) but I have reminded the patient to continue to take aspirin 81 mg daily.  We have not recommended any other changes  to the patient's current medications.  The patient wants to discuss matters further with Dr. Virgina Jock before she makes any changes.  I have encouraged the patient to continue to gradually increase her physical activity without any particular limitations.  The patient has been reminded regarding the importance of dental hygiene and the lifelong need for antibiotic prophylaxis for all dental cleanings and other related invasive procedures.  The patient will continue to follow-up regularly with Dr. Virgina Jock.  At some point she should undergo routine follow-up echocardiogram.   We will plan to see the patient again for routine follow-up next January, approximately 1 year following her surgery.  She will call and return sooner should specific problems or questions arise.    I discussed limitations of evaluation and management via telephone.  The patient was advised to call back for repeat telephone consultation or to seek an in-person evaluation if questions arise or the patient's clinical condition changes in any significant manner.  I spent in excess of 10 minutes of non-face-to-face time during the conduct of this telephone virtual office consultation.     [name]. Roxy Manns, MD 08/13/2018 10:41 AM

## 2018-08-14 ENCOUNTER — Telehealth: Payer: Self-pay

## 2018-08-14 MED ORDER — FUROSEMIDE 20 MG PO TABS: 20.0000 mg | ORAL_TABLET | Freq: Every day | ORAL | 2 refills | Status: AC | PRN

## 2018-08-14 NOTE — Telephone Encounter (Signed)
Okay to stop warfarin. Claling in 20 mg prn lasix. Please call in next few weeks if swelling does not improve.   Thanks MJP

## 2018-08-14 NOTE — Telephone Encounter (Signed)
Pt called stating that she saw dr.owens and he says she can stop coumadin after 3 months; She was thinking can she stop June 2 when she sees you again if that's ok with you; Dr.Owens says that she has no restrictions and she sees him again Jan of 2021; She also says that her right leg has been swelling lately and she wants to know can you call her in a lasix?

## 2018-08-15 ENCOUNTER — Ambulatory Visit (HOSPITAL_COMMUNITY): Payer: Medicare Other

## 2018-08-17 ENCOUNTER — Ambulatory Visit (HOSPITAL_COMMUNITY): Payer: Medicare Other

## 2018-08-21 ENCOUNTER — Telehealth (HOSPITAL_COMMUNITY): Payer: Self-pay

## 2018-08-21 NOTE — Telephone Encounter (Signed)
Phone call made to Pt to provide information about virtual cardiac rehab. Pt did not answer. Voicemail was left for Pt to return call.

## 2018-08-22 ENCOUNTER — Ambulatory Visit (HOSPITAL_COMMUNITY): Payer: Medicare Other

## 2018-08-23 ENCOUNTER — Telehealth (HOSPITAL_COMMUNITY): Payer: Self-pay | Admitting: *Deleted

## 2018-08-23 NOTE — Telephone Encounter (Signed)
No response from message left on voicemail regarding Virtual Cardiac Rehab for the continuation of her participation.  Please contact letter sent to address on file in epic. Await response, if no response by June 12th will discharge pt from the program.  Carlette Carlton RN, BSN Cardiac and Pulmonary Rehab Nurse Navigator   

## 2018-08-24 ENCOUNTER — Ambulatory Visit (HOSPITAL_COMMUNITY): Payer: Medicare Other

## 2018-08-27 ENCOUNTER — Encounter (HOSPITAL_COMMUNITY)
Admission: RE | Admit: 2018-08-27 | Discharge: 2018-08-27 | Disposition: A | Discharge status: Home / Self Care | Payer: Medicare Other | Source: Ambulatory Visit | Attending: Cardiology | Admitting: Cardiology

## 2018-08-27 ENCOUNTER — Ambulatory Visit (HOSPITAL_COMMUNITY): Payer: Medicare Other

## 2018-08-27 ENCOUNTER — Encounter (HOSPITAL_COMMUNITY): Payer: Self-pay | Admitting: *Deleted

## 2018-08-27 ENCOUNTER — Telehealth (HOSPITAL_COMMUNITY): Payer: Self-pay

## 2018-08-27 ENCOUNTER — Other Ambulatory Visit: Payer: Self-pay

## 2018-08-27 DIAGNOSIS — Z951 Presence of aortocoronary bypass graft: Secondary | ICD-10-CM

## 2018-08-27 DIAGNOSIS — Z9889 Other specified postprocedural states: Secondary | ICD-10-CM | POA: Insufficient documentation

## 2018-08-27 NOTE — Progress Notes (Signed)
Spoke with the patient regarding the virtual app. Tami Wood decided she does not want to participate in virtual cardiac rehab because she does not want to download the app on her phone. Tami Wood says that she is still interested in participating in phase 2 cardiac rehab when the program reopens.Barnet Pall, RN,BSN 08/27/2018 12:38 PM

## 2018-08-27 NOTE — Telephone Encounter (Signed)
° °        Confirm Consent - In the setting of the current Covid19 crisis, you are scheduled for a phone visit with your Cardiac or Pulmonary team member.  Just as we do with many in-gym visits, in order for you to participate in this visit, we must obtain consent.  If you'd like, I can send this to your mychart (if signed up) or email for you to review.  Otherwise, I can obtain your verbal consent now.  By agreeing to a telephone visit, we'd like you to understand that the technology does not allow for your Cardiac or Pulmonary Rehab team member to perform a physical assessment, and thus may limit their ability to fully assess your ability to perform exercise programs. If your provider identifies any concerns that need to be evaluated in person, we will make arrangements to do so.  Finally, though the technology is pretty good, we cannot assure that it will always work on either your or our end and we cannot ensure that we have a secure connection.  Cardiac and Pulmonary Rehab Telehealth visits and At Home cardiac and pulmonary rehab are provided at no cost to you.               Are you willing to proceed?" STAFF: Did the patient verbally acknowledge consent to telehealth visit? Document YES/NO here: Yes      Jessica C.   Cardiac and Pulmonary Rehab Staff   D6/03/2018 T10:27AM

## 2018-08-28 ENCOUNTER — Ambulatory Visit (INDEPENDENT_AMBULATORY_CARE_PROVIDER_SITE_OTHER): Payer: Medicare Other | Admitting: Cardiology

## 2018-08-29 ENCOUNTER — Ambulatory Visit (HOSPITAL_COMMUNITY): Payer: Medicare Other

## 2018-08-30 ENCOUNTER — Encounter (HOSPITAL_COMMUNITY): Payer: Medicare Other

## 2018-08-31 ENCOUNTER — Ambulatory Visit (HOSPITAL_COMMUNITY): Payer: Medicare Other

## 2018-09-03 ENCOUNTER — Ambulatory Visit (HOSPITAL_COMMUNITY): Payer: Medicare Other

## 2018-09-05 ENCOUNTER — Ambulatory Visit (HOSPITAL_COMMUNITY): Payer: Medicare Other

## 2018-09-07 ENCOUNTER — Ambulatory Visit (HOSPITAL_COMMUNITY): Payer: Medicare Other

## 2018-09-10 ENCOUNTER — Ambulatory Visit (HOSPITAL_COMMUNITY): Payer: Medicare Other

## 2018-09-12 ENCOUNTER — Ambulatory Visit (HOSPITAL_COMMUNITY): Payer: Medicare Other

## 2018-09-14 ENCOUNTER — Ambulatory Visit (HOSPITAL_COMMUNITY): Payer: Medicare Other

## 2018-09-15 ENCOUNTER — Other Ambulatory Visit: Payer: Self-pay | Admitting: Cardiology

## 2018-09-17 ENCOUNTER — Ambulatory Visit (HOSPITAL_COMMUNITY): Payer: Medicare Other

## 2018-09-18 ENCOUNTER — Other Ambulatory Visit: Payer: Self-pay

## 2018-09-18 DIAGNOSIS — I10 Essential (primary) hypertension: Secondary | ICD-10-CM

## 2018-09-18 MED ORDER — METOPROLOL TARTRATE 25 MG PO TABS: ORAL_TABLET | ORAL | 3 refills | Status: DC

## 2018-09-25 ENCOUNTER — Telehealth (HOSPITAL_COMMUNITY): Payer: Self-pay

## 2018-09-25 NOTE — Telephone Encounter (Signed)
Called and spoke with pt in regards to CR, pt stated she does not feel safe coming out. She will give [place] a call when she is ready.  Closed referral

## 2018-10-19 ENCOUNTER — Telehealth (HOSPITAL_COMMUNITY): Payer: Self-pay | Admitting: Internal Medicine

## 2018-11-01 ENCOUNTER — Other Ambulatory Visit: Payer: Self-pay | Admitting: Cardiology

## 2018-11-01 DIAGNOSIS — I25810 Atherosclerosis of coronary artery bypass graft(s) without angina pectoris: Secondary | ICD-10-CM

## 2018-11-11 NOTE — Progress Notes (Signed)
Follow up visit  Subjective:   Tami Wood, female    DOB: [birth year], 68 y.o.   MRN: [id number]   I connected with the patient on 11/12/2018 by a video enabled telemedicine application and verified that I am speaking with the correct person using two identifiers.     I discussed the limitations of evaluation and management by telemedicine and the availability of in person appointments. The patient expressed understanding and agreed to proceed.   This visit type was conducted due to national recommendations for restrictions regarding the COVID-19 Pandemic (e.g. social distancing).  This format is felt to be most appropriate for this patient at this time.  All issues noted in this document were discussed and addressed.  No physical exam was performed (except for noted visual exam findings with Tele health visits).  The patient has consented to conduct a Tele health visit and understands insurance will be billed.   Chief complaint: S/p mitral valve repair  HPI  68 y/o [place] American female with mitral valve prolapse, CAD, now s/p mitral valve repair, CABGX2 (03/2018 by Dr. Roxy Manns), hypertension.  Her exercise has been limited lately due to hot weather. She is wondering if she should resume cardiac rehab. She denies chest pain, shortness of breath, palpitations, leg edema, orthopnea, PND, TIA/syncope.   Past Medical History:  Diagnosis Date  . ALLERGIC RHINITIS 06/18/2009  . ANEMIA-IRON DEFICIENCY 11/28/2006  . ANXIETY 11/28/2006  . Arthritis   . Coronary artery disease   . Cough 06/18/2009  . DEPRESSION 11/28/2006  . GERD 12/10/2007  . History of kidney stones   . HYPERTENSION 11/28/2006  . Mitral regurgitation   . MURMUR 11/28/2006  . PEPTIC ULCER DISEASE 12/10/2007  . 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 S/P mitral valve repair 04/10/2018   Complex valvuloplasty including artificial Gore-tex neochord placement x8 and Sorin Memo 4D ring annuloplasty,  size 28     Past Surgical History:  Procedure Laterality Date  . ABDOMINAL HYSTERECTOMY    . CARDIAC CATHETERIZATION    . CHOLECYSTECTOMY    . CORONARY ARTERY BYPASS GRAFT N/A 04/10/2018   Procedure: CORONARY ARTERY BYPASS GRAFTING (CABG), ON PUMP, TIMES TWO, LIMA TO LAD AND SVG TO RCA USING LEFT INTERNAL MAMMARY ARTERY AND RIGHT GREATER SAPHENOUS VEIN HARVESTED ENDOSCOPICALLY;  Surgeon: Rexene Alberts, MD;  Location: [facility];  Service: Open Heart Surgery;  Laterality: N/A;  . MITRAL VALVE REPAIR N/A 04/10/2018   Procedure: MITRAL VALVE REPAIR (MVR) USING 28 MEMO 4D;  Surgeon: Rexene Alberts, MD;  Location: [facility];  Service: Open Heart Surgery;  Laterality: N/A;  . RIGHT/LEFT HEART CATH AND CORONARY ANGIOGRAPHY N/A 02/06/2018   Procedure: RIGHT/LEFT HEART CATH AND CORONARY ANGIOGRAPHY;  Surgeon: Nigel Mormon, MD;  Location: [facility];  Service: Cardiovascular;  Laterality: N/A;  . s/p multiple breast biopsy     negative  . TEE WITHOUT CARDIOVERSION N/A 02/06/2018   Procedure: TRANSESOPHAGEAL ECHOCARDIOGRAM (TEE);  Surgeon: Nigel Mormon, MD;  Location: [facility] ENDOS[facility];  Service: Cardiovascular;  Laterality: N/A;  . TEE WITHOUT CARDIOVERSION N/A 04/10/2018   Procedure: TRANSESOPHAGEAL ECHOCARDIOGRAM (TEE);  Surgeon: Rexene Alberts, MD;  Location: [facility];  Service: Open Heart Surgery;  Laterality: N/A;     Social History   Socioeconomic History  . Marital status: Single    Spouse name: Not on file  . Number of children: 0  . Years of  education: Not on file  . Highest education level: High school graduate  Occupational History  . Not on file  Social Needs  . Financial resource strain: Not on file  . Food insecurity    Worry: Never true    Inability: Never true  . Transportation needs    Medical: No    Non-medical: No  Tobacco Use  . Smoking status: Never Smoker  . Smokeless tobacco: Never Used  Substance and Sexual Activity  . Alcohol use: No  . Drug  use: Never  . Sexual activity: Not on file  Lifestyle  . Physical activity    Days per week: 0 days    Minutes per session: 0 min  . Stress: Only a little  Relationships  . Social Herbalist on phone: Not on file    Gets together: Not on file    Attends religious service: Not on file    Active member of club or organization: Not on file    Attends meetings of clubs or organizations: Not on file    Relationship status: Not on file  . Intimate partner violence    Fear of current or ex partner: Not on file    Emotionally abused: Not on file    Physically abused: Not on file    Forced sexual activity: Not on file  Other Topics Concern  . Not on file  Social History Narrative  . Not on file     Family History  Problem Relation Age of Onset  . Transient ischemic attack Other   . Hypertension Other      Current Outpatient Medications on File Prior to Visit  Medication Sig Dispense Refill  . amLODipine (NORVASC) 5 MG tablet Take 1 tablet (5 mg total) by mouth daily. 30 tablet 3  . amoxicillin (AMOXIL) 500 MG capsule Take 2 capsules (1,000 mg total) by mouth 2 (two) times daily. (Patient taking differently: Take 2,000 mg by mouth See admin instructions. For Dental Procedures) 40 capsule 0  . aspirin 81 MG EC tablet Take 81 mg by mouth daily.      Marland Kitchen atorvastatin (LIPITOR) 80 MG tablet TAKE 1 TABLET BY MOUTH DAILY AT 6 PM 90 tablet 3  . furosemide (LASIX) 20 MG tablet Take 1 tablet (20 mg total) by mouth daily as needed. 30 tablet 2  . metoprolol tartrate (LOPRESSOR) 25 MG tablet TAKE 1 TABLET(25 MG) BY MOUTH TWICE DAILY 180 tablet 3  . spironolactone (ALDACTONE) 50 MG tablet Take 50 mg by mouth daily.    Marland Kitchen warfarin (COUMADIN) 5 MG tablet As directed by coumadin clinic  Directions as of 07/27/2018. Please note that this is subject to change in the future.  Take 1 pill on Monday, Wednesday, Thursday, Saturday, Sunday  Take 1 1/2 pills on Tuesday, Friday 60 tablet 3   No  current facility-administered medications on file prior to visit.     Cardiovascular studies:  TEE 04/10/2018:  Left atrium: Cavity is mildly dilated.  Aortic valve: No stenosis. Trace regurgitation.  Mitral valve: Leaflets appear myxomatous, dilated mitral annulus and repaired mitral valve with annuloplasty ring. After CPB, mitral valve was well visualized. Annular ring appeared well seated. No evidence of regurgitation. Mean gradient of 1 was measured across the valve. Severe regurgitation. There is prolapse of the middle scallop of the posterior mitral leaflet.  Right ventricle: Normal cavity size, wall thickness and ejection fraction.  Tricuspid valve: Trace regurgitation.  Procedure 04/10/2018:  (Dr. Roxy Manns)  Mitral Valve Repair             Complex valvuloplasty including artificial Gore-tex neochord placement x8             Sorin Memo 4D ring annuloplasty (size 89mm, catalog #[id number], serial [id number])   Coronary Artery Bypass Grafting x 2              Left Internal Mammary Artery to Distal Left Anterior Descending Coronary Artery             Saphenous Vein Graft to Posterior Descending Coronary Artery             Endoscopic Vein Harvest from Right Thigh   Carotid US 04/06/2018: Right Carotid: Velocities in the right ICA are consistent with a 1-39% stenosis.  Left Carotid: Velocities in the left ICA are consistent with a 1-39% stenosis. Vertebrals: Left vertebral artery demonstrates antegrade flow. Right vertebral             artery demonstrates bidirectional flow.  Recent labs: Results for LORELEI, HEIKKILA (MRN [id number]) as of 07/27/2018 11:04  Ref. Range 04/27/2018 44:31  BASIC METABOLIC PANEL Unknown Rpt (A)  Sodium Latest Ref Range: 134 - 144 mmol/L 137  Potassium Latest Ref Range: 3.5 - 5.2 mmol/L 4.1  Chloride Latest Ref Range: 96 - 106 mmol/L 100  CO2 Latest Ref Range: 20 - 29 mmol/L 22  Glucose Latest Ref Range: 65 - 99 mg/dL 124 (H)  BUN Latest Ref Range: 8 -  27 mg/dL 8  Creatinine Latest Ref Range: 0.57 - 1.00 mg/dL 0.69  Calcium Latest Ref Range: 8.7 - 10.3 mg/dL 8.9  BUN/Creatinine Ratio Latest Ref Range: 12 - 28  12  GFR, Est Non African American Latest Ref Range: >59 mL/min/1.73 89  GFR, Est African American Latest Ref Range: >59 mL/min/1.73 103   Results for JEN, BENEDICT (MRN [id number]) as of 07/27/2018 11:04  Ref. Range 04/27/2018 11:51  WBC Latest Ref Range: 3.4 - 10.8 x10E3/uL 6.8  RBC Latest Ref Range: 3.77 - 5.28 x10E6/uL 3.27 (L)  Hemoglobin Latest Ref Range: 11.1 - 15.9 g/dL 9.6 (L)  HCT Latest Ref Range: 34.0 - 46.6 % 28.6 (L)  MCV Latest Ref Range: 79 - 97 fL 88  MCH Latest Ref Range: 26.6 - 33.0 pg 29.4  MCHC Latest Ref Range: 31.5 - 35.7 g/dL 33.6  RDW Latest Ref Range: 11.7 - 15.4 % 13.4  Platelets Latest Ref Range: 150 - 450 x10E3/uL 277    Review of Systems  Constitution: Negative for decreased appetite, malaise/fatigue, weight gain and weight loss.  HENT: Negative for congestion.   Eyes: Negative for visual disturbance.  Cardiovascular: Negative for chest pain, dyspnea on exertion, leg swelling, palpitations and syncope.  Respiratory: Negative for shortness of breath.   Endocrine: Negative for cold intolerance.  Hematologic/Lymphatic: Does not bruise/bleed easily.  Skin: Negative for itching and rash.  Musculoskeletal: Negative for myalgias.  Gastrointestinal: Negative for abdominal pain, nausea and vomiting.  Genitourinary: Negative for dysuria.  Neurological: Negative for dizziness and weakness.  Psychiatric/Behavioral: The patient is not nervous/anxious.   All other systems reviewed and are negative.       Vitals not available.  Objective:   Physical Exam  Constitutional: She is oriented to person, place, and time. She appears well-developed and well-nourished. No distress.  Pulmonary/Chest: Effort normal.  Neurological: She is alert and oriented to person, place, and time.  Psychiatric: She has a  normal mood and affect.  Nursing note and vitals  reviewed.         Assessment & Recommendations:   68 y/o [place] American female with mitral valve prolapse, CAD, now s/p mitral valve repair, CABGX2 (03/2018 by Dr. Roxy Manns), hypertension, here for follow up.  1. S/P mitral valve repair Warfarin stopped as per Dr. Guy Sandifer recommendations. Will obtain post-op echocardiogram and see her for in office visit/   2. Essential hypertension No change made today. Will check BP at upcoming office visit.   3. Coronary artery disease involving coronary bypass graft of native heart without angina pectoris No angina symptoms. Continue Aspirin, statin.  Nigel Mormon, MD [facility] Pager: [phone] Office: [phone] If no answer Cell [phone]

## 2018-11-12 ENCOUNTER — Telehealth (INDEPENDENT_AMBULATORY_CARE_PROVIDER_SITE_OTHER): Payer: Medicare Other | Admitting: Cardiology

## 2018-11-12 DIAGNOSIS — I10 Essential (primary) hypertension: Secondary | ICD-10-CM | POA: Diagnosis not present

## 2018-11-12 DIAGNOSIS — Z9889 Other specified postprocedural states: Secondary | ICD-10-CM

## 2018-11-12 DIAGNOSIS — I25810 Atherosclerosis of coronary artery bypass graft(s) without angina pectoris: Secondary | ICD-10-CM

## 2018-11-13 NOTE — Telephone Encounter (Signed)
Pt called and wanted to schedule for cardiac rehab. She was interested in participating in the Cardiac Rehab Program. Patient will come in for orientation on 11/20/2018 and will attend the 1045 exercise class.  Mailed homework package.

## 2018-11-14 ENCOUNTER — Telehealth (HOSPITAL_COMMUNITY): Payer: Self-pay | Admitting: Pharmacist

## 2018-11-15 NOTE — Addendum Note (Signed)
Addended by: Nigel Mormon on: 11/15/2018 03:20 PM   Modules accepted: Orders

## 2018-11-15 NOTE — Telephone Encounter (Signed)
Cardiac Rehab Medication Review by a Pharmacist  Does the patient  feel that his/her medications are working for him/her?  yes  Has the patient been experiencing any side effects to the medications prescribed?  no  Does the patient measure his/her own blood pressure or blood glucose at home?  no   Does the patient have any problems obtaining medications due to transportation or finances?   no  Understanding of regimen: good Understanding of indications: good Potential of compliance: good  Pharmacist comments: Patient expresses concern that she is seeing floaters and thinks her Atorvastatin dose is too high. Advised her that this is not a common side effect with this medication, but that she should call if it worsens.   Richardine Service, PharmD PGY1 Pharmacy Resident Phone: [phone] 11/15/2018  4:10 PM  Please check [email or web address] for unit-specific pharmacy phone numbers.

## 2018-11-16 DIAGNOSIS — I25810 Atherosclerosis of coronary artery bypass graft(s) without angina pectoris: Secondary | ICD-10-CM | POA: Diagnosis not present

## 2018-11-16 DIAGNOSIS — I10 Essential (primary) hypertension: Secondary | ICD-10-CM | POA: Diagnosis not present

## 2018-11-17 LAB — BASIC METABOLIC PANEL
BUN/Creatinine Ratio: 21 (ref 12–28)
BUN: 15 mg/dL (ref 8–27)
CO2: 27 mmol/L (ref 20–29)
Calcium: 8.6 mg/dL — ABNORMAL LOW (ref 8.7–10.3)
Chloride: 102 mmol/L (ref 96–106)
Creatinine, Ser: 0.73 mg/dL (ref 0.57–1.00)
GFR calc Af Amer: 97 mL/min/{1.73_m2} (ref >59)
GFR calc non Af Amer: 84 mL/min/{1.73_m2} (ref >59)
Glucose: 98 mg/dL (ref 65–99)
Potassium: 4.4 mmol/L (ref 3.5–5.2)
Sodium: 139 mmol/L (ref 134–144)

## 2018-11-17 LAB — CBC
Hematocrit: 35.7 % (ref 34.0–46.6)
Hemoglobin: 11.7 g/dL (ref 11.1–15.9)
MCH: 29.8 pg (ref 26.6–33.0)
MCHC: 32.8 g/dL (ref 31.5–35.7)
MCV: 91 fL (ref 79–97)
Platelets: 171 10*3/uL (ref 150–450)
RBC: 3.93 x10E6/uL (ref 3.77–5.28)
RDW: 12.8 % (ref 11.7–15.4)
WBC: 3.8 10*3/uL (ref 3.4–10.8)

## 2018-11-19 ENCOUNTER — Telehealth (HOSPITAL_COMMUNITY): Payer: Self-pay | Admitting: *Deleted

## 2018-11-20 ENCOUNTER — Encounter (HOSPITAL_COMMUNITY)
Admission: RE | Admit: 2018-11-20 | Discharge: 2018-11-20 | Disposition: A | Discharge status: Home / Self Care | Payer: Medicare Other | Source: Ambulatory Visit | Attending: Cardiology | Admitting: Cardiology

## 2018-11-20 ENCOUNTER — Encounter (HOSPITAL_COMMUNITY): Payer: Self-pay

## 2018-11-20 ENCOUNTER — Other Ambulatory Visit: Payer: Self-pay

## 2018-11-20 VITALS — BP 104/60 | HR 67 | Temp 98.4°F | Ht 65.0 in | Wt 168.0 lb

## 2018-11-20 DIAGNOSIS — Z951 Presence of aortocoronary bypass graft: Secondary | ICD-10-CM | POA: Insufficient documentation

## 2018-11-20 DIAGNOSIS — Z9889 Other specified postprocedural states: Secondary | ICD-10-CM

## 2018-11-20 NOTE — Progress Notes (Signed)
Cardiac Individual Treatment Plan  Patient Details  Name: Tami Wood MRN: [id number] Date of Birth: [birth year] Referring Provider:     [place] from 11/20/2018 in [place]  Referring Provider  Patwardhan, Reynold Bowen, MD      Initial Encounter Date:    CARDIAC REHAB PHASE II ORIENTATION from 11/20/2018 in [place]  Date  11/20/18      Visit Diagnosis: 04/10/2018 CABG x2  04/10/2018 MV Repair  Patient's Home Medications on Admission:  Current Outpatient Medications:  .  amLODipine (NORVASC) 5 MG tablet, Take 1 tablet (5 mg total) by mouth daily., Disp: 30 tablet, Rfl: 3 .  amoxicillin (AMOXIL) 500 MG capsule, Take 2 capsules (1,000 mg total) by mouth 2 (two) times daily., Disp: 40 capsule, Rfl: 0 .  aspirin 81 MG EC tablet, Take 81 mg by mouth daily.  , Disp: , Rfl:  .  atorvastatin (LIPITOR) 80 MG tablet, TAKE 1 TABLET BY MOUTH DAILY AT 6 PM, Disp: 90 tablet, Rfl: 3 .  furosemide (LASIX) 20 MG tablet, Take 1 tablet (20 mg total) by mouth daily as needed., Disp: 30 tablet, Rfl: 2 .  metoprolol tartrate (LOPRESSOR) 25 MG tablet, TAKE 1 TABLET(25 MG) BY MOUTH TWICE DAILY, Disp: 180 tablet, Rfl: 3 .  spironolactone (ALDACTONE) 50 MG tablet, Take 50 mg by mouth daily., Disp: , Rfl:   Past Medical History: Past Medical History:  Diagnosis Date  . ALLERGIC RHINITIS 06/18/2009  . ANEMIA-IRON DEFICIENCY 11/28/2006  . ANXIETY 11/28/2006  . Arthritis   . Coronary artery disease   . Cough 06/18/2009  . DEPRESSION 11/28/2006  . GERD 12/10/2007  . History of kidney stones   . HYPERTENSION 11/28/2006  . Mitral regurgitation   . MURMUR 11/28/2006  . PEPTIC ULCER DISEASE 12/10/2007  . 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 S/P mitral valve repair 04/10/2018   Complex valvuloplasty including artificial Gore-tex neochord placement x8 and Sorin Memo 4D ring annuloplasty, size 28     Tobacco Use: Social History   Tobacco Use  Smoking Status Never Smoker  Smokeless Tobacco Never Used    Labs: Recent Review Flowsheet Data    Labs for ITP Cardiac and Pulmonary Rehab Latest Ref Rng & Units 04/10/2018 04/10/2018 04/10/2018 04/10/2018 04/11/2018   Cholestrol 0 - 200 mg/dL - - - - -   LDLCALC 0 - 99 mg/dL - - - - -   HDL >39.00 mg/dL - - - - -   Trlycerides 0.0 - 149.0 mg/dL - - - - -   Hemoglobin A1c 4.8 - 5.6 % - - - - -   PHART 7.350 - 7.450 7.378 7.293(L) - 7.319(L) -   PCO2ART 32.0 - 48.0 mmHg 38.2 42.6 - 45.9 -   HCO3 20.0 - 28.0 mmol/L 23.1 20.8 - 23.8 -   TCO2 22 - 32 mmol/L 24 22 25 25 23    ACIDBASEDEF 0.0 - 2.0 mmol/L 2.0 5.0(H) - 2.0 -   O2SAT % 99.0 99.0 - 99.0 -      Capillary Blood Glucose: Lab Results  Component Value Date   GLUCAP 136 (H) 04/12/2018   GLUCAP 109 (H) 04/12/2018   GLUCAP 130 (H) 04/11/2018   GLUCAP 153 (H) 04/11/2018   GLUCAP 166 (H) 04/11/2018     Exercise Target Goals: Exercise Program Goal: Individual exercise prescription set using results from initial  6 min walk test and THRR while considering  patient's activity barriers and safety.   Exercise Prescription Goal: Starting with aerobic activity 30 plus minutes a day, 3 days per week for initial exercise prescription. Provide home exercise prescription and guidelines that participant acknowledges understanding prior to discharge.  Activity Barriers & Risk Stratification: Activity Barriers & Cardiac Risk Stratification - 11/20/18 1200      Activity Barriers & Cardiac Risk Stratification   Activity Barriers  Arthritis    Comments  Arthritis- right knee    Cardiac Risk Stratification  High       6 Minute Walk: 6 Minute Walk    Row Name 06/05/18 0812 11/20/18 0942       6 Minute Walk   Phase  Initial  Initial    Distance  1453 feet  1408 feet    Walk Time  6 minutes  6 minutes    # of Rest Breaks  0  0    MPH  2.75  2.67    METS  3.17  3.09    RPE  7  11     Perceived Dyspnea   0  0    VO2 Peak  11.08  10.81    Symptoms  No  No    Resting HR  68 bpm  67 bpm    Resting BP  122/70  104/60    Resting Oxygen Saturation   99 %  100 %    Exercise Oxygen Saturation  during 6 min walk  97 %  100 %    Max Ex. HR  86 bpm  93 bpm    Max Ex. BP  140/82  142/68    2 Minute Post BP  124/68  132/64       Oxygen Initial Assessment:   Oxygen Re-Evaluation:   Oxygen Discharge (Final Oxygen Re-Evaluation):   Initial Exercise Prescription: Initial Exercise Prescription - 11/20/18 1100      Date of Initial Exercise RX and Referring Provider   Date  11/20/18    Referring Provider  Nigel Mormon, MD    Expected Discharge Date  01/04/19      Recumbant Bike   Level  2    Watts  27    Minutes  15    METs  3.2      NuStep   Level  2    SPM  85    Minutes  15    METs  2.7      Prescription Details   Frequency (times per week)  3    Duration  Progress to 30 minutes of continuous aerobic without signs/symptoms of physical distress      Intensity   THRR 40-80% of Max Heartrate  60-121    Ratings of Perceived Exertion  11-13    Perceived Dyspnea  0-4      Progression   Progression  Continue to progress workloads to maintain intensity without signs/symptoms of physical distress.      Resistance Training   Training Prescription  Yes    Weight  3lbs    Reps  10-15       Perform Capillary Blood Glucose checks as needed.  Exercise Prescription Changes: Exercise Prescription Changes    Row Name 06/13/18 1300             Home Exercise Plan   Plans to continue exercise at  Home (comment)       Frequency  Add 4 additional  days to program exercise sessions.       Initial Home Exercises Provided  06/13/18          Exercise Comments: Exercise Comments    Row Name 06/13/18 1335           Exercise Comments  Reviwed HEP with Pt. Pt was responsive and understands goals.           Exercise Goals and Review: Exercise Goals     Row Name 06/05/18 0827 11/20/18 1200           Exercise Goals   Increase Physical Activity  Yes  Yes      Intervention  Provide advice, education, support and counseling about physical activity/exercise needs.;Develop an individualized exercise prescription for aerobic and resistive training based on initial evaluation findings, risk stratification, comorbidities and participant's personal goals.  Provide advice, education, support and counseling about physical activity/exercise needs.;Develop an individualized exercise prescription for aerobic and resistive training based on initial evaluation findings, risk stratification, comorbidities and participant's personal goals.      Expected Outcomes  Short Term: Attend rehab on a regular basis to increase amount of physical activity.;Long Term: Add in home exercise to make exercise part of routine and to increase amount of physical activity.;Long Term: Exercising regularly at least 3-5 days a week.  Short Term: Attend rehab on a regular basis to increase amount of physical activity.;Long Term: Exercising regularly at least 3-5 days a week.;Long Term: Add in home exercise to make exercise part of routine and to increase amount of physical activity.      Increase Strength and Stamina  Yes  Yes      Intervention  Develop an individualized exercise prescription for aerobic and resistive training based on initial evaluation findings, risk stratification, comorbidities and participant's personal goals.;Provide advice, education, support and counseling about physical activity/exercise needs.  Provide advice, education, support and counseling about physical activity/exercise needs.;Develop an individualized exercise prescription for aerobic and resistive training based on initial evaluation findings, risk stratification, comorbidities and participant's personal goals.      Expected Outcomes  Short Term: Increase workloads from initial exercise prescription for  resistance, speed, and METs.;Short Term: Perform resistance training exercises routinely during rehab and add in resistance training at home;Long Term: Improve cardiorespiratory fitness, muscular endurance and strength as measured by increased METs and functional capacity (6MWT)  Short Term: Increase workloads from initial exercise prescription for resistance, speed, and METs.;Short Term: Perform resistance training exercises routinely during rehab and add in resistance training at home;Long Term: Improve cardiorespiratory fitness, muscular endurance and strength as measured by increased METs and functional capacity (6MWT)      Able to understand and use rate of perceived exertion (RPE) scale  Yes  Yes      Intervention  Provide education and explanation on how to use RPE scale  Provide education and explanation on how to use RPE scale      Expected Outcomes  Short Term: Able to use RPE daily in rehab to express subjective intensity level;Long Term:  Able to use RPE to guide intensity level when exercising independently  Short Term: Able to use RPE daily in rehab to express subjective intensity level;Long Term:  Able to use RPE to guide intensity level when exercising independently      Knowledge and understanding of Target Heart Rate Range (THRR)  Yes  Yes      Intervention  Provide education and explanation of THRR including how the numbers were predicted and  where they are located for reference  Provide education and explanation of THRR including how the numbers were predicted and where they are located for reference      Expected Outcomes  Long Term: Able to use THRR to govern intensity when exercising independently;Short Term: Able to state/look up THRR;Short Term: Able to use daily as guideline for intensity in rehab  Short Term: Able to state/look up THRR;Long Term: Able to use THRR to govern intensity when exercising independently;Short Term: Able to use daily as guideline for intensity in rehab       Able to check pulse independently  Yes  Yes      Intervention  Provide education and demonstration on how to check pulse in carotid and radial arteries.;Review the importance of being able to check your own pulse for safety during independent exercise  Provide education and demonstration on how to check pulse in carotid and radial arteries.;Review the importance of being able to check your own pulse for safety during independent exercise      Expected Outcomes  Short Term: Able to explain why pulse checking is important during independent exercise;Long Term: Able to check pulse independently and accurately  Short Term: Able to explain why pulse checking is important during independent exercise;Long Term: Able to check pulse independently and accurately      Understanding of Exercise Prescription  Yes  Yes      Intervention  Provide education, explanation, and written materials on patient's individual exercise prescription  Provide education, explanation, and written materials on patient's individual exercise prescription      Expected Outcomes  Short Term: Able to explain program exercise prescription;Long Term: Able to explain home exercise prescription to exercise independently  Short Term: Able to explain program exercise prescription;Long Term: Able to explain home exercise prescription to exercise independently         Exercise Goals Re-Evaluation : Exercise Goals Re-Evaluation    Row Name 06/13/18 1334             Exercise Goal Re-Evaluation   Exercise Goals Review  Increase Physical Activity;Increase Strength and Stamina;Able to understand and use rate of perceived exertion (RPE) scale;Knowledge and understanding of Target Heart Rate Range (THRR);Understanding of Exercise Prescription       Comments  Reviewed HEP with Pt. Pt was responsive and understands THRR, RPE scale, weather precautions, and end points of exercise. Pt is walking at home 4-5 days per week for 30-45 minutes.         Expected Outcomes  Will continue to monitor and progress Pt as tolerated.            Discharge Exercise Prescription (Final Exercise Prescription Changes): Exercise Prescription Changes - 06/13/18 1300      Home Exercise Plan   Plans to continue exercise at  Home (comment)    Frequency  Add 4 additional days to program exercise sessions.    Initial Home Exercises Provided  06/13/18       Nutrition:  Target Goals: Understanding of nutrition guidelines, daily intake of sodium 1500mg , cholesterol 200mg , calories 30% from fat and 7% or less from saturated fats, daily to have 5 or more servings of fruits and vegetables.  Biometrics: Pre Biometrics - 11/20/18 0932      Pre Biometrics   Height  5\' 5"  (1.651 m)    Weight  76.2 kg    Waist Circumference  33 inches    Hip Circumference  43 inches    Waist to Hip  Ratio  0.77 %    BMI (Calculated)  27.96    Triceps Skinfold  35 mm    % Body Fat  39.5 %    Grip Strength  31 kg    Flexibility  14.5 in    Single Leg Stand  4.37 seconds        Nutrition Therapy Plan and Nutrition Goals: Nutrition Therapy & Goals - 06/18/18 1221      Nutrition Therapy   Diet  Heart healthy      Personal Nutrition Goals   Nutrition Goal  Pt to identify and limit food sources of sodium, saturated fat, trans fat, and refined carbohydrates      Intervention Plan   Intervention  Prescribe, educate and counsel regarding individualized specific dietary modifications aiming towards targeted core components such as weight, hypertension, lipid management, diabetes, heart failure and other comorbidities.    Expected Outcomes  Short Term Goal: Understand basic principles of dietary content, such as calories, fat, sodium, cholesterol and nutrients.;Long Term Goal: Adherence to prescribed nutrition plan.       Nutrition Assessments: Nutrition Assessments - 06/18/18 1221      MEDFICTS Scores   Pre Score  --   pt did not complete survey at orientation       Nutrition Goals Re-Evaluation:   Nutrition Goals Discharge (Final Nutrition Goals Re-Evaluation):   Psychosocial: Target Goals: Acknowledge presence or absence of significant depression and/or stress, maximize coping skills, provide positive support system. Participant is able to verbalize types and ability to use techniques and skills needed for reducing stress and depression.  Initial Review & Psychosocial Screening: Initial Psych Review & Screening - 11/20/18 1325      Initial Review   Current issues with  Current Stress Concerns      Family Dynamics   Good Support System?  Yes   Shameca has her sisters and friends for support     Barriers   Psychosocial barriers to participate in program  There are no identifiable barriers or psychosocial needs.      Screening Interventions   Interventions  Encouraged to exercise       Quality of Life Scores: Quality of Life - 11/20/18 1152      Quality of Life   Select  Quality of Life      Quality of Life Scores   Health/Function Pre  26.7 %    Socioeconomic Pre  29.06 %    Psych/Spiritual Pre  29.64 %    Family Pre  28.5 %    GLOBAL Pre  28.09 %      Scores of 19 and below usually indicate a poorer quality of life in these areas.  A difference of  2-3 points is a clinically meaningful difference.  A difference of 2-3 points in the total score of the Quality of Life Index has been associated with significant improvement in overall quality of life, self-image, physical symptoms, and general health in studies assessing change in quality of life.  PHQ-9: Recent Review Flowsheet Data    Depression screen [facility] 2/9 11/20/2018 04/09/2018 08/16/2017 07/01/2016 03/09/2016   Decreased Interest 0 0 0 0 0   Down, Depressed, Hopeless 0 0 0 0 0   PHQ - 2 Score 0 0 0 0 0     Interpretation of Total Score  Total Score Depression Severity:  1-4 = Minimal depression, 5-9 = Mild depression, 10-14 = Moderate depression, 15-19 = Moderately  severe depression, 20-27 = Severe depression  Psychosocial Evaluation and Intervention:   Psychosocial Re-Evaluation:   Psychosocial Discharge (Final Psychosocial Re-Evaluation):   Vocational Rehabilitation: Provide vocational rehab assistance to qualifying candidates.   Vocational Rehab Evaluation & Intervention: Vocational Rehab - 11/20/18 1326      Initial Vocational Rehab Evaluation & Intervention   Assessment shows need for Vocational Rehabilitation  No   Seline is currently working and does not need vocational rehab at this time      Education: Education Goals: Education classes will be provided on a weekly basis, covering required topics. Participant will state understanding/return demonstration of topics presented.  Learning Barriers/Preferences: Learning Barriers/Preferences - 11/20/18 1154      Learning Barriers/Preferences   Learning Barriers  Sight;Hearing   Memory deficits   Learning Preferences  Written Material;Skilled Demonstration       Education Topics: Hypertension, Hypertension Reduction -Define heart disease and high blood pressure. Discus how high blood pressure affects the body and ways to reduce high blood pressure.   Exercise and Your Heart -Discuss why it is important to exercise, the FITT principles of exercise, normal and abnormal responses to exercise, and how to exercise safely.   Angina -Discuss definition of angina, causes of angina, treatment of angina, and how to decrease risk of having angina.   Cardiac Medications -Review what the following cardiac medications are used for, how they affect the body, and side effects that may occur when taking the medications.  Medications include Aspirin, Beta blockers, calcium channel blockers, ACE Inhibitors, angiotensin receptor blockers, diuretics, digoxin, and antihyperlipidemics.   Congestive Heart Failure -Discuss the definition of CHF, how to live with CHF, the signs and symptoms of  CHF, and how keep track of weight and sodium intake.   Heart Disease and Intimacy -Discus the effect sexual activity has on the heart, how changes occur during intimacy as we age, and safety during sexual activity.   Smoking Cessation / COPD -Discuss different methods to quit smoking, the health benefits of quitting smoking, and the definition of COPD.   Nutrition I: Fats -Discuss the types of cholesterol, what cholesterol does to the heart, and how cholesterol levels can be controlled.   Nutrition II: Labels -Discuss the different components of food labels and how to read food label   Heart Parts/Heart Disease and PAD -Discuss the anatomy of the heart, the pathway of blood circulation through the heart, and these are affected by heart disease.   Stress I: Signs and Symptoms -Discuss the causes of stress, how stress may lead to anxiety and depression, and ways to limit stress.   Stress II: Relaxation -Discuss different types of relaxation techniques to limit stress.   Warning Signs of Stroke / TIA -Discuss definition of a stroke, what the signs and symptoms are of a stroke, and how to identify when someone is having stroke.   Knowledge Questionnaire Score: Knowledge Questionnaire Score - 06/05/18 1017      Knowledge Questionnaire Score   Pre Score  15/24       Core Components/Risk Factors/Patient Goals at Admission: Personal Goals and Risk Factors at Admission - 11/20/18 1154      Core Components/Risk Factors/Patient Goals on Admission   Hypertension  Yes    Intervention  Provide education on lifestyle modifcations including regular physical activity/exercise, weight management, moderate sodium restriction and increased consumption of fresh fruit, vegetables, and low fat dairy, alcohol moderation, and smoking cessation.;Monitor prescription use compliance.    Expected Outcomes  Short Term: Continued assessment and intervention until BP  is < 140/43mm HG in hypertensive  participants. < 130/27mm HG in hypertensive participants with diabetes, heart failure or chronic kidney disease.;Long Term: Maintenance of blood pressure at goal levels.       Core Components/Risk Factors/Patient Goals Review:    Core Components/Risk Factors/Patient Goals at Discharge (Final Review):    ITP Comments: ITP Comments    Row Name 06/05/18 0748 06/19/18 1509 11/20/18 0855       ITP Comments  Medical Director- Dr. Fransico Him, MD  30 day ITP. pt currently on hold for CR dept closure as part of COVID 19 precautions.   Medical Director- Dr. Fransico Him, MD        Comments: Patient attended orientation on 11/20/2018 to review rules and guidelines for program.  Completed 6 minute walk test, Intitial ITP, and exercise prescription.  VSS. Telemetry-Sinus rhythm with an inverted t wave this has been previously documented.  Asymptomatic. Safety measures and social distancing in place per CDC guidelines.Barnet Pall, RN,BSN 11/20/2018 1:35 PM

## 2018-11-26 ENCOUNTER — Encounter (HOSPITAL_COMMUNITY)
Admission: RE | Admit: 2018-11-26 | Discharge: 2018-11-26 | Disposition: A | Discharge status: Home / Self Care | Payer: Medicare Other | Source: Ambulatory Visit | Attending: Cardiology | Admitting: Cardiology

## 2018-11-26 ENCOUNTER — Other Ambulatory Visit: Payer: Self-pay

## 2018-11-26 DIAGNOSIS — Z9889 Other specified postprocedural states: Secondary | ICD-10-CM | POA: Diagnosis not present

## 2018-11-26 DIAGNOSIS — Z951 Presence of aortocoronary bypass graft: Secondary | ICD-10-CM

## 2018-11-26 NOTE — Progress Notes (Signed)
Daily Session Note  Patient Details  Name: Tami Wood MRN: [id number] Date of Birth: Oct 14, 1950 Referring Provider:     [place] from 11/20/2018 in [place]  Referring Provider  Nigel Mormon, MD      Encounter Date: 11/26/2018  Check In: Session Check In - 11/26/18 1047      Check-In   Supervising physician immediately available to respond to emergencies  [facility] immediately available    Physician(s)  Dr. Marthenia Rolling    Location  [facility]    Staff Present  Jiles Garter, RN, Luisa Hart, RN, Deland Pretty, MS, ACSM CEP, Exercise Physiologist;Brittany Durene Fruits, BS, ACSM CEP, Exercise Physiologist;Maria Whitaker, RN, BSN    Virtual Visit  No    Medication changes reported      No    Fall or balance concerns reported     No    Tobacco Cessation  No Change    Warm-up and Cool-down  Performed on first and last piece of equipment    Resistance Training Performed  Yes    VAD Patient?  No    PAD/SET Patient?  No      Pain Assessment   Currently in Pain?  No/denies    Multiple Pain Sites  No       Capillary Blood Glucose: No results found for this or any previous visit (from the past 24 hour(s)).  Exercise Prescription Changes - 11/26/18 1139      Response to Exercise   Blood Pressure (Admit)  124/80    Blood Pressure (Exercise)  156/82    Blood Pressure (Exit)  118/82    Heart Rate (Admit)  86 bpm    Heart Rate (Exercise)  100 bpm    Heart Rate (Exit)  83 bpm    Rating of Perceived Exertion (Exercise)  12    Symptoms  none    Comments  Patient tolerated 1st exercise session well.    Duration  Progress to 30 minutes of  aerobic without signs/symptoms of physical distress    Intensity  THRR unchanged      Progression   Progression  Continue to progress workloads to maintain intensity without signs/symptoms of physical distress.    Average METs  2.2      Resistance  Training   Training Prescription  Yes    Weight  3lbs    Reps  10-15    Time  10 Minutes      Interval Training   Interval Training  No      Recumbant Bike   Level  2    Minutes  15    METs  2      NuStep   Level  2    SPM  85    Minutes  15    METs  2.3       Social History   Tobacco Use  Smoking Status Never Smoker  Smokeless Tobacco Never Used    Goals Met:  Exercise tolerated well  Goals Unmet:  Not Applicable  Comments: Pt started cardiac rehab today.  Pt tolerated light exercise without difficulty. VSS, telemetry-SR, asymptomatic.  Medication list reconciled. Pt denies barriers to medicaiton compliance.  PSYCHOSOCIAL ASSESSMENT:  PHQ-0. Pt exhibits positive coping skills, hopeful outlook with supportive family. No psychosocial needs identified at this time, no psychosocial interventions necessary.  Pt oriented to exercise equipment and routine.    Understanding verbalized.    Dr. Tressia Miners  Turner is Market researcher for Cardiac Rehab at [facility].

## 2018-11-28 ENCOUNTER — Encounter (HOSPITAL_COMMUNITY)
Admission: RE | Admit: 2018-11-28 | Discharge: 2018-11-28 | Disposition: A | Discharge status: Home / Self Care | Payer: Medicare Other | Source: Ambulatory Visit | Attending: Cardiology | Admitting: Cardiology

## 2018-11-28 ENCOUNTER — Other Ambulatory Visit: Payer: Self-pay

## 2018-11-28 DIAGNOSIS — Z951 Presence of aortocoronary bypass graft: Secondary | ICD-10-CM | POA: Diagnosis not present

## 2018-11-28 DIAGNOSIS — Z9889 Other specified postprocedural states: Secondary | ICD-10-CM | POA: Diagnosis not present

## 2018-11-30 ENCOUNTER — Other Ambulatory Visit: Payer: Self-pay

## 2018-11-30 ENCOUNTER — Encounter (HOSPITAL_COMMUNITY)
Admission: RE | Admit: 2018-11-30 | Discharge: 2018-11-30 | Disposition: A | Discharge status: Home / Self Care | Payer: Medicare Other | Source: Ambulatory Visit | Attending: Cardiology | Admitting: Cardiology

## 2018-11-30 DIAGNOSIS — Z9889 Other specified postprocedural states: Secondary | ICD-10-CM

## 2018-11-30 DIAGNOSIS — Z951 Presence of aortocoronary bypass graft: Secondary | ICD-10-CM | POA: Diagnosis not present

## 2018-11-30 NOTE — Progress Notes (Signed)
Reviewed home exercise guidelines with patient including endpoints, temperature precautions, target heart rate and rate of perceived exertion. Pt plans to walk as her mode of home exercise. Pt voices understanding of instructions given. Kynslie Ringle M Jayley Hustead, MS, ACSM CEP  

## 2018-12-04 ENCOUNTER — Ambulatory Visit (INDEPENDENT_AMBULATORY_CARE_PROVIDER_SITE_OTHER): Payer: Medicare Other

## 2018-12-04 ENCOUNTER — Other Ambulatory Visit: Payer: Self-pay

## 2018-12-04 DIAGNOSIS — I10 Essential (primary) hypertension: Secondary | ICD-10-CM

## 2018-12-04 DIAGNOSIS — I25810 Atherosclerosis of coronary artery bypass graft(s) without angina pectoris: Secondary | ICD-10-CM | POA: Diagnosis not present

## 2018-12-04 DIAGNOSIS — Z9889 Other specified postprocedural states: Secondary | ICD-10-CM | POA: Diagnosis not present

## 2018-12-05 ENCOUNTER — Encounter (HOSPITAL_COMMUNITY)
Admission: RE | Admit: 2018-12-05 | Discharge: 2018-12-05 | Disposition: A | Discharge status: Home / Self Care | Payer: Medicare Other | Source: Ambulatory Visit | Attending: Cardiology | Admitting: Cardiology

## 2018-12-05 ENCOUNTER — Other Ambulatory Visit: Payer: Self-pay

## 2018-12-05 DIAGNOSIS — Z951 Presence of aortocoronary bypass graft: Secondary | ICD-10-CM | POA: Diagnosis not present

## 2018-12-05 DIAGNOSIS — Z9889 Other specified postprocedural states: Secondary | ICD-10-CM | POA: Diagnosis not present

## 2018-12-05 NOTE — Progress Notes (Signed)
Follow up visit  Subjective:   Tami Wood, female    DOB: 09-26-1950, 68 y.o.   MRN: [id number]  Chief Complaint  Patient presents with  . MVR  . Results    echo, labs  . Follow-up    HPI  68 y/o [place] American female with mitral valve prolapse, CAD, now s/p mitral valve repair, CABGX2 (03/2018 by Dr. Roxy Manns), hypertension, here for follow up.  Recent echocardiogram showed no residual MR. EF is now down to 35-40%, probably reflecting patient's true EF prior to MV repair surgery for severe MR. She is working with cardiac rehab. She has mild dyspnea on exertion, and right > left leg edema.    Past Medical History:  Diagnosis Date  . ALLERGIC RHINITIS 06/18/2009  . ANEMIA-IRON DEFICIENCY 11/28/2006  . ANXIETY 11/28/2006  . Arthritis   . Coronary artery disease   . Cough 06/18/2009  . DEPRESSION 11/28/2006  . GERD 12/10/2007  . History of kidney stones   . HYPERTENSION 11/28/2006  . Mitral regurgitation   . MURMUR 11/28/2006  . PEPTIC ULCER DISEASE 12/10/2007  . 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 S/P mitral valve repair 04/10/2018   Complex valvuloplasty including artificial Gore-tex neochord placement x8 and Sorin Memo 4D ring annuloplasty, size 28     Past Surgical History:  Procedure Laterality Date  . ABDOMINAL HYSTERECTOMY    . CARDIAC CATHETERIZATION    . CHOLECYSTECTOMY    . CORONARY ARTERY BYPASS GRAFT N/A 04/10/2018   Procedure: CORONARY ARTERY BYPASS GRAFTING (CABG), ON PUMP, TIMES TWO, LIMA TO LAD AND SVG TO RCA USING LEFT INTERNAL MAMMARY ARTERY AND RIGHT GREATER SAPHENOUS VEIN HARVESTED ENDOSCOPICALLY;  Surgeon: Rexene Alberts, MD;  Location: [facility];  Service: Open Heart Surgery;  Laterality: N/A;  . MITRAL VALVE REPAIR N/A 04/10/2018   Procedure: MITRAL VALVE REPAIR (MVR) USING 28 MEMO 4D;  Surgeon: Rexene Alberts, MD;  Location: [facility];  Service: Open Heart Surgery;  Laterality: N/A;  . RIGHT/LEFT HEART CATH AND CORONARY  ANGIOGRAPHY N/A 02/06/2018   Procedure: RIGHT/LEFT HEART CATH AND CORONARY ANGIOGRAPHY;  Surgeon: Nigel Mormon, MD;  Location: [facility];  Service: Cardiovascular;  Laterality: N/A;  . s/p multiple breast biopsy     negative  . TEE WITHOUT CARDIOVERSION N/A 02/06/2018   Procedure: TRANSESOPHAGEAL ECHOCARDIOGRAM (TEE);  Surgeon: Nigel Mormon, MD;  Location: [facility];  Service: Cardiovascular;  Laterality: N/A;  . TEE WITHOUT CARDIOVERSION N/A 04/10/2018   Procedure: TRANSESOPHAGEAL ECHOCARDIOGRAM (TEE);  Surgeon: Rexene Alberts, MD;  Location: [facility];  Service: Open Heart Surgery;  Laterality: N/A;     Social History   Socioeconomic History  . Marital status: Single    Spouse name: Not on file  . Number of children: 0  . Years of education: Not on file  . Highest education level: High school graduate  Occupational History  . Not on file  Social Needs  . Financial resource strain: Not hard at all  . Food insecurity    Worry: Never true    Inability: Never true  . Transportation needs    Medical: No    Non-medical: No  Tobacco Use  . Smoking status: Never Smoker  . Smokeless tobacco: Never Used  Substance and Sexual Activity  . Alcohol use: No  . Drug use: Never  . Sexual activity: Not on file  Lifestyle  . Physical activity  Days per week: 0 days    Minutes per session: 0 min  . Stress: Only a little  Relationships  . Social Herbalist on phone: Not on file    Gets together: Not on file    Attends religious service: Not on file    Active member of club or organization: Not on file    Attends meetings of clubs or organizations: Not on file    Relationship status: Not on file  . Intimate partner violence    Fear of current or ex partner: Not on file    Emotionally abused: Not on file    Physically abused: Not on file    Forced sexual activity: Not on file  Other Topics Concern  . Not on file  Social History Narrative  . Not  on file     Family History  Problem Relation Age of Onset  . Transient ischemic attack Other   . Hypertension Other      Current Outpatient Medications on File Prior to Visit  Medication Sig Dispense Refill  . amLODipine (NORVASC) 5 MG tablet Take 1 tablet (5 mg total) by mouth daily. 30 tablet 3  . amoxicillin (AMOXIL) 500 MG capsule Take 2 capsules (1,000 mg total) by mouth 2 (two) times daily. 40 capsule 0  . aspirin 81 MG EC tablet Take 81 mg by mouth daily.      Marland Kitchen atorvastatin (LIPITOR) 80 MG tablet TAKE 1 TABLET BY MOUTH DAILY AT 6 PM 90 tablet 3  . furosemide (LASIX) 20 MG tablet Take 1 tablet (20 mg total) by mouth daily as needed. 30 tablet 2  . metoprolol tartrate (LOPRESSOR) 25 MG tablet TAKE 1 TABLET(25 MG) BY MOUTH TWICE DAILY 180 tablet 3  . spironolactone (ALDACTONE) 50 MG tablet Take 50 mg by mouth daily.     No current facility-administered medications on file prior to visit.     Cardiovascular studies:  EKG 12/10/2018: Sinus rhythm 73 bpm. Anterolateral T wave inversions. Consider ischemia.   Echocardiogram 12/01/2018:   Left ventricle cavity is normal in size.Moderately depressed LV systolic function with visual EF 35-40%. Mildly hypokinetic global wall motion. Abnormal septal wall motion due to post-operative valve. Mild concentric hypertrophy of the left ventricle. Indeterminate diastolic filling pattern due to MV repair.  Calculated EF 55%. Left atrial cavity is moderately dilated at 4.7 cm. There is MV ring noted in good position. Mild mitral valve leaflet thickening.  Mildly restricted mitral valve leaflets. Mild mitral valve stenosis. Mitral valve peak pressure gradient of 11.7 and mean gradient of 3.9 mmHg, calculated mitral valve area 1.8 cm. Mild to moderate mitral regurgitation. Mild tricuspid regurgitation. No evidence of pulmonary hypertension. Compared to the study done on 12/25/2017, LVEF previously reported to be 55%.  Mitral valve repair is new.   TEE 04/10/2018:  Left atrium: Cavity is mildly dilated.  Aortic valve: No stenosis. Trace regurgitation.  Mitral valve: Leaflets appear myxomatous, dilated mitral annulus and repaired mitral valve with annuloplasty ring. After CPB, mitral valve was well visualized. Annular ring appeared well seated. No evidence of regurgitation. Mean gradient of 1 was measured across the valve. Severe regurgitation. There is prolapse of the middle scallop of the posterior mitral leaflet.  Right ventricle: Normal cavity size, wall thickness and ejection fraction.  Tricuspid valve: Trace regurgitation.  Procedure 04/10/2018:  (Dr. Roxy Manns)   Mitral Valve Repair             Complex valvuloplasty including artificial Gore-tex  neochord placement x8             Sorin Memo 4D ring annuloplasty (size 54mm, catalog #[id number], serial [id number])   Coronary Artery Bypass Grafting x 2              Left Internal Mammary Artery to Distal Left Anterior Descending Coronary Artery             Saphenous Vein Graft to Posterior Descending Coronary Artery             Endoscopic Vein Harvest from Right Thigh   Carotid US 04/06/2018: Right Carotid: Velocities in the right ICA are consistent with a 1-39% stenosis.  Left Carotid: Velocities in the left ICA are consistent with a 1-39% stenosis. Vertebrals: Left vertebral artery demonstrates antegrade flow. Right vertebral             artery demonstrates bidirectional flow.  Recent labs: Results for KINDEL, WINKFIELD (MRN [id number]) as of 07/27/2018 11:04  Ref. Range 04/27/2018 [id number]  BASIC METABOLIC PANEL Unknown Rpt (A)  Sodium Latest Ref Range: 134 - 144 mmol/L 137  Potassium Latest Ref Range: 3.5 - 5.2 mmol/L 4.1  Chloride Latest Ref Range: 96 - 106 mmol/L 100  CO2 Latest Ref Range: 20 - 29 mmol/L 22  Glucose Latest Ref Range: 65 - 99 mg/dL 124 (H)  BUN Latest Ref Range: 8 - 27 mg/dL 8  Creatinine Latest Ref Range: 0.57 - 1.00 mg/dL 0.69  Calcium Latest Ref Range: 8.7 -  10.3 mg/dL 8.9  BUN/Creatinine Ratio Latest Ref Range: 12 - 28  12  GFR, Est Non African American Latest Ref Range: >59 mL/min/1.73 89  GFR, Est African American Latest Ref Range: >59 mL/min/1.73 103   Results for SHAKALA, BASGALL (MRN [id number]) as of 07/27/2018 11:04  Ref. Range 04/27/2018 11:51  WBC Latest Ref Range: 3.4 - 10.8 x10E3/uL 6.8  RBC Latest Ref Range: 3.77 - 5.28 x10E6/uL 3.27 (L)  Hemoglobin Latest Ref Range: 11.1 - 15.9 g/dL 9.6 (L)  HCT Latest Ref Range: 34.0 - 46.6 % 28.6 (L)  MCV Latest Ref Range: 79 - 97 fL 88  MCH Latest Ref Range: 26.6 - 33.0 pg 29.4  MCHC Latest Ref Range: 31.5 - 35.7 g/dL 33.6  RDW Latest Ref Range: 11.7 - 15.4 % 13.4  Platelets Latest Ref Range: 150 - 450 x10E3/uL 277    Review of Systems  Constitution: Negative for decreased appetite, malaise/fatigue, weight gain and weight loss.  HENT: Negative for congestion.   Eyes: Negative for visual disturbance.  Cardiovascular: Negative for chest pain, dyspnea on exertion, leg swelling, palpitations and syncope.  Respiratory: Negative for shortness of breath.   Endocrine: Negative for cold intolerance.  Hematologic/Lymphatic: Does not bruise/bleed easily.  Skin: Negative for itching and rash.  Musculoskeletal: Negative for myalgias.  Gastrointestinal: Negative for abdominal pain, nausea and vomiting.  Genitourinary: Negative for dysuria.  Neurological: Negative for dizziness and weakness.  Psychiatric/Behavioral: The patient is not nervous/anxious.   All other systems reviewed and are negative.        Vitals:   12/10/18 1556  BP: 131/89  Pulse: 77  SpO2: 99%    Objective:   Physical Exam  Constitutional: She is oriented to person, place, and time. She appears well-developed and well-nourished. No distress.  HENT:  Head: Normocephalic and atraumatic.  Eyes: Pupils are equal, round, and reactive to light. Conjunctivae are normal.  Neck: No JVD present.  Cardiovascular: Normal rate,  regular rhythm and intact distal pulses.  No murmur heard. Pulmonary/Chest: Effort normal and breath sounds normal. She has no wheezes. She has no rales.  Abdominal: Soft. Bowel sounds are normal. There is no rebound.  Musculoskeletal:        General: No edema.  Lymphadenopathy:    She has no cervical adenopathy.  Neurological: She is alert and oriented to person, place, and time. No cranial nerve deficit.  Skin: Skin is warm and dry.  Psychiatric: She has a normal mood and affect.  Nursing note and vitals reviewed.         Assessment & Recommendations:   68 y/o [place] female with mitral valve prolapse, CAD, now s/p mitral valve repair, CABGX2 (03/2018 by Dr. Roxy Manns), hypertension, mild HFrEF  HFrEF: EF 35-40%. NYHA class II symptoms. Stop amlodipine and metoprolol tartarate. Start Entresto 24-26 mg bid. Metoprolol succinate 25 mg daily. BMP in 1 week. BP check and Entresto up titration in 10-14 days.   S/P mitral valve repair: On Aspirin 81 mg daily.  Essential hypertension: Added amlodipine 5 mg daily. Continue metoprolol 25 mg bid.   Coronary artery disease involving coronary bypass graft of native heart without angina pectoris: No angina symptoms. Continue aspirin 81 mg daily, lipitor 80 mg daily.    Nigel Mormon, MD [facility] Pager: [phone] Office: [phone] If no answer Cell [phone]

## 2018-12-06 ENCOUNTER — Other Ambulatory Visit: Payer: Self-pay | Admitting: Cardiology

## 2018-12-06 DIAGNOSIS — I10 Essential (primary) hypertension: Secondary | ICD-10-CM

## 2018-12-06 DIAGNOSIS — Z803 Family history of malignant neoplasm of breast: Secondary | ICD-10-CM | POA: Diagnosis not present

## 2018-12-06 DIAGNOSIS — Z1231 Encounter for screening mammogram for malignant neoplasm of breast: Secondary | ICD-10-CM | POA: Diagnosis not present

## 2018-12-07 ENCOUNTER — Other Ambulatory Visit: Payer: Self-pay

## 2018-12-07 ENCOUNTER — Encounter (HOSPITAL_COMMUNITY)
Admission: RE | Admit: 2018-12-07 | Discharge: 2018-12-07 | Disposition: A | Discharge status: Home / Self Care | Payer: Medicare Other | Source: Ambulatory Visit | Attending: Cardiology | Admitting: Cardiology

## 2018-12-07 DIAGNOSIS — Z9889 Other specified postprocedural states: Secondary | ICD-10-CM | POA: Diagnosis not present

## 2018-12-07 DIAGNOSIS — Z951 Presence of aortocoronary bypass graft: Secondary | ICD-10-CM

## 2018-12-10 ENCOUNTER — Ambulatory Visit (INDEPENDENT_AMBULATORY_CARE_PROVIDER_SITE_OTHER): Payer: Medicare Other | Admitting: Cardiology

## 2018-12-10 ENCOUNTER — Encounter (HOSPITAL_COMMUNITY)
Admission: RE | Admit: 2018-12-10 | Discharge: 2018-12-10 | Disposition: A | Discharge status: Home / Self Care | Payer: Medicare Other | Source: Ambulatory Visit | Attending: Cardiology | Admitting: Cardiology

## 2018-12-10 ENCOUNTER — Encounter: Payer: Self-pay | Admitting: Cardiology

## 2018-12-10 ENCOUNTER — Other Ambulatory Visit: Payer: Self-pay

## 2018-12-10 VITALS — BP 131/89 | HR 77 | Ht 65.5 in | Wt 171.8 lb

## 2018-12-10 DIAGNOSIS — Z9889 Other specified postprocedural states: Secondary | ICD-10-CM

## 2018-12-10 DIAGNOSIS — Z951 Presence of aortocoronary bypass graft: Secondary | ICD-10-CM

## 2018-12-10 DIAGNOSIS — I5022 Chronic systolic (congestive) heart failure: Secondary | ICD-10-CM | POA: Diagnosis not present

## 2018-12-10 DIAGNOSIS — I25810 Atherosclerosis of coronary artery bypass graft(s) without angina pectoris: Secondary | ICD-10-CM | POA: Diagnosis not present

## 2018-12-10 MED ORDER — METOPROLOL SUCCINATE ER 25 MG PO TB24: 25.0000 mg | ORAL_TABLET | Freq: Every day | ORAL | 3 refills | Status: DC

## 2018-12-10 MED ORDER — SACUBITRIL-VALSARTAN 24-26 MG PO TABS: 1.0000 | ORAL_TABLET | Freq: Two times a day (BID) | ORAL | Status: DC

## 2018-12-12 ENCOUNTER — Encounter (HOSPITAL_COMMUNITY)
Admission: RE | Admit: 2018-12-12 | Discharge: 2018-12-12 | Disposition: A | Discharge status: Home / Self Care | Payer: Medicare Other | Source: Ambulatory Visit | Attending: Cardiology | Admitting: Cardiology

## 2018-12-12 ENCOUNTER — Other Ambulatory Visit: Payer: Self-pay

## 2018-12-12 DIAGNOSIS — Z951 Presence of aortocoronary bypass graft: Secondary | ICD-10-CM

## 2018-12-12 DIAGNOSIS — Z9889 Other specified postprocedural states: Secondary | ICD-10-CM | POA: Diagnosis not present

## 2018-12-13 NOTE — Progress Notes (Signed)
Cardiac Individual Treatment Plan  Patient Details  Name: Tami Wood MRN: [id number] Date of Birth: 1950-05-28 Referring Provider:     [place] from 11/20/2018 in [facility]  Referring Provider  Patwardhan, Reynold Bowen, MD      Initial Encounter Date:    CARDIAC REHAB PHASE II ORIENTATION from 11/20/2018 in [facility]  Date  11/20/18      Visit Diagnosis: 04/10/2018 MV Repair  04/10/2018 CABG x2  Patient's Home Medications on Admission:  Current Outpatient Medications:  .  amoxicillin (AMOXIL) 500 MG capsule, Take 2 capsules (1,000 mg total) by mouth 2 (two) times daily. (Patient taking differently: Take 1,000 mg by mouth 2 (two) times daily. Prior to dental), Disp: 40 capsule, Rfl: 0 .  aspirin 81 MG EC tablet, Take 81 mg by mouth daily.  , Disp: , Rfl:  .  atorvastatin (LIPITOR) 80 MG tablet, TAKE 1 TABLET BY MOUTH DAILY AT 6 PM, Disp: 90 tablet, Rfl: 3 .  furosemide (LASIX) 20 MG tablet, Take 1 tablet (20 mg total) by mouth daily as needed., Disp: 30 tablet, Rfl: 2 .  metoprolol succinate (TOPROL XL) 25 MG 24 hr tablet, Take 1 tablet (25 mg total) by mouth daily., Disp: 30 tablet, Rfl: 3  Current Facility-Administered Medications:  .  sacubitril-valsartan (ENTRESTO) 24-26 mg per tablet, 1 tablet, Oral, BID, Patwardhan, Reynold Bowen, MD  Past Medical History: Past Medical History:  Diagnosis Date  . ALLERGIC RHINITIS 06/18/2009  . ANEMIA-IRON DEFICIENCY 11/28/2006  . ANXIETY 11/28/2006  . Arthritis   . Coronary artery disease   . Cough 06/18/2009  . DEPRESSION 11/28/2006  . GERD 12/10/2007  . History of kidney stones   . HYPERTENSION 11/28/2006  . Mitral regurgitation   . MURMUR 11/28/2006  . PEPTIC ULCER DISEASE 12/10/2007  . 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 S/P mitral valve repair 04/10/2018   Complex valvuloplasty including artificial Gore-tex neochord  placement x8 and Sorin Memo 4D ring annuloplasty, size 28    Tobacco Use: Social History   Tobacco Use  Smoking Status Never Smoker  Smokeless Tobacco Never Used    Labs: Recent Review Flowsheet Data    Labs for ITP Cardiac and Pulmonary Rehab Latest Ref Rng & Units 04/10/2018 04/10/2018 04/10/2018 04/10/2018 04/11/2018   Cholestrol 0 - 200 mg/dL - - - - -   LDLCALC 0 - 99 mg/dL - - - - -   HDL >39.00 mg/dL - - - - -   Trlycerides 0.0 - 149.0 mg/dL - - - - -   Hemoglobin A1c 4.8 - 5.6 % - - - - -   PHART 7.350 - 7.450 7.378 7.293(L) - 7.319(L) -   PCO2ART 32.0 - 48.0 mmHg 38.2 42.6 - 45.9 -   HCO3 20.0 - 28.0 mmol/L 23.1 20.8 - 23.8 -   TCO2 22 - 32 mmol/L 24 22 25 25 23    ACIDBASEDEF 0.0 - 2.0 mmol/L 2.0 5.0(H) - 2.0 -   O2SAT % 99.0 99.0 - 99.0 -      Capillary Blood Glucose: Lab Results  Component Value Date   GLUCAP 136 (H) 04/12/2018   GLUCAP 109 (H) 04/12/2018   GLUCAP 130 (H) 04/11/2018   GLUCAP 153 (H) 04/11/2018   GLUCAP 166 (H) 04/11/2018     Exercise Target Goals: Exercise Program Goal: Individual exercise prescription set using results  from initial 6 min walk test and THRR while considering  patient's activity barriers and safety.   Exercise Prescription Goal: Initial exercise prescription builds to 30-45 minutes a day of aerobic activity, 2-3 days per week.  Home exercise guidelines will be given to patient during program as part of exercise prescription that the participant will acknowledge.  Activity Barriers & Risk Stratification: Activity Barriers & Cardiac Risk Stratification - 11/20/18 1200      Activity Barriers & Cardiac Risk Stratification   Activity Barriers  Arthritis    Comments  Arthritis- right knee    Cardiac Risk Stratification  High       6 Minute Walk: 6 Minute Walk    Row Name 11/20/18 0942         6 Minute Walk   Phase  Initial     Distance  1408 feet     Walk Time  6 minutes     # of Rest Breaks  0     MPH  2.67     METS   3.09     RPE  11     Perceived Dyspnea   0     VO2 Peak  10.81     Symptoms  No     Resting HR  67 bpm     Resting BP  104/60     Resting Oxygen Saturation   100 %     Exercise Oxygen Saturation  during 6 min walk  100 %     Max Ex. HR  93 bpm     Max Ex. BP  142/68     2 Minute Post BP  132/64        Oxygen Initial Assessment:   Oxygen Re-Evaluation:   Oxygen Discharge (Final Oxygen Re-Evaluation):   Initial Exercise Prescription: Initial Exercise Prescription - 11/20/18 1100      Date of Initial Exercise RX and Referring Provider   Date  11/20/18    Referring Provider  Nigel Mormon, MD    Expected Discharge Date  01/04/19      Recumbant Bike   Level  2    Watts  27    Minutes  15    METs  3.2      NuStep   Level  2    SPM  85    Minutes  15    METs  2.7      Prescription Details   Frequency (times per week)  3    Duration  Progress to 30 minutes of continuous aerobic without signs/symptoms of physical distress      Intensity   THRR 40-80% of Max Heartrate  60-121    Ratings of Perceived Exertion  11-13    Perceived Dyspnea  0-4      Progression   Progression  Continue to progress workloads to maintain intensity without signs/symptoms of physical distress.      Resistance Training   Training Prescription  Yes    Weight  3lbs    Reps  10-15       Perform Capillary Blood Glucose checks as needed.  Exercise Prescription Changes: Exercise Prescription Changes    Row Name 11/26/18 1139 12/10/18 1047           Response to Exercise   Blood Pressure (Admit)  124/80  104/60      Blood Pressure (Exercise)  156/82  130/82      Blood Pressure (Exit)  118/82  120/76  Heart Rate (Admit)  86 bpm  71 bpm      Heart Rate (Exercise)  100 bpm  103 bpm      Heart Rate (Exit)  83 bpm  79 bpm      Rating of Perceived Exertion (Exercise)  12  9      Symptoms  none  none      Comments  Patient tolerated 1st exercise session well.  -       Duration  Progress to 30 minutes of  aerobic without signs/symptoms of physical distress  Progress to 30 minutes of  aerobic without signs/symptoms of physical distress      Intensity  THRR unchanged  THRR unchanged        Progression   Progression  Continue to progress workloads to maintain intensity without signs/symptoms of physical distress.  Continue to progress workloads to maintain intensity without signs/symptoms of physical distress.      Average METs  2.2  2.6        Resistance Training   Training Prescription  Yes  Yes      Weight  3lbs  3lbs      Reps  10-15  10-15      Time  10 Minutes  10 Minutes        Interval Training   Interval Training  No  No        Recumbant Bike   Level  2  3.5      Minutes  15  15      METs  2  2.6        NuStep   Level  2  2      SPM  85  85      Minutes  15  15      METs  2.3  - Didn't report average        Home Exercise Plan   Plans to continue exercise at  -  Home (comment) Walking      Frequency  -  Add 3 additional days to program exercise sessions.      Initial Home Exercises Provided  -  11/30/18         Exercise Comments: Exercise Comments    Row Name 11/26/18 1218 11/30/18 1104 12/10/18 1100       Exercise Comments  Patient tolerated low intensity exercise well without symtpoms.  Reviewed home exercise guidelines and goals with patient.  Reviewed METs and goals with patient.        Exercise Goals and Review: Exercise Goals    Row Name 11/20/18 1200             Exercise Goals   Increase Physical Activity  Yes       Intervention  Provide advice, education, support and counseling about physical activity/exercise needs.;Develop an individualized exercise prescription for aerobic and resistive training based on initial evaluation findings, risk stratification, comorbidities and participant's personal goals.       Expected Outcomes  Short Term: Attend rehab on a regular basis to increase amount of physical  activity.;Long Term: Exercising regularly at least 3-5 days a week.;Long Term: Add in home exercise to make exercise part of routine and to increase amount of physical activity.       Increase Strength and Stamina  Yes       Intervention  Provide advice, education, support and counseling about physical activity/exercise needs.;Develop an individualized exercise prescription for aerobic and resistive training based on  initial evaluation findings, risk stratification, comorbidities and participant's personal goals.       Expected Outcomes  Short Term: Increase workloads from initial exercise prescription for resistance, speed, and METs.;Short Term: Perform resistance training exercises routinely during rehab and add in resistance training at home;Long Term: Improve cardiorespiratory fitness, muscular endurance and strength as measured by increased METs and functional capacity (6MWT)       Able to understand and use rate of perceived exertion (RPE) scale  Yes       Intervention  Provide education and explanation on how to use RPE scale       Expected Outcomes  Short Term: Able to use RPE daily in rehab to express subjective intensity level;Long Term:  Able to use RPE to guide intensity level when exercising independently       Knowledge and understanding of Target Heart Rate Range (THRR)  Yes       Intervention  Provide education and explanation of THRR including how the numbers were predicted and where they are located for reference       Expected Outcomes  Short Term: Able to state/look up THRR;Long Term: Able to use THRR to govern intensity when exercising independently;Short Term: Able to use daily as guideline for intensity in rehab       Able to check pulse independently  Yes       Intervention  Provide education and demonstration on how to check pulse in carotid and radial arteries.;Review the importance of being able to check your own pulse for safety during independent exercise       Expected  Outcomes  Short Term: Able to explain why pulse checking is important during independent exercise;Long Term: Able to check pulse independently and accurately       Understanding of Exercise Prescription  Yes       Intervention  Provide education, explanation, and written materials on patient's individual exercise prescription       Expected Outcomes  Short Term: Able to explain program exercise prescription;Long Term: Able to explain home exercise prescription to exercise independently          Exercise Goals Re-Evaluation : Exercise Goals Re-Evaluation    Row Name 11/26/18 1218 11/30/18 1104 12/10/18 1100         Exercise Goal Re-Evaluation   Exercise Goals Review  Increase Physical Activity;Able to understand and use rate of perceived exertion (RPE) scale;Increase Strength and Stamina  Increase Physical Activity;Able to understand and use rate of perceived exertion (RPE) scale;Increase Strength and Stamina;Understanding of Exercise Prescription  Increase Physical Activity;Able to understand and use rate of perceived exertion (RPE) scale;Increase Strength and Stamina;Understanding of Exercise Prescription     Comments  Patient able to understand and use RPE scale appropriately.  Reviewed home exercise guidelines with patient. Reviewed pulse coutning and handout given.  Patient states that her legs feel better since she's started exercise in the cardiac rehab program. Pt is walking at least 30 minutes, 1-2 days at home.     Expected Outcomes  Increase workloads as tolerated to help increase strength and stamina.  Patient will walk 30 minutes 1-2 days/week at home in addition to exercise at cardiac rehab to help increase strength and stamina.  Increase workloads as tolerated to help improve cardiorespiratory fitness.        Discharge Exercise Prescription (Final Exercise Prescription Changes): Exercise Prescription Changes - 12/10/18 1047      Response to Exercise   Blood Pressure (Admit)   104/60  Blood Pressure (Exercise)  130/82    Blood Pressure (Exit)  120/76    Heart Rate (Admit)  71 bpm    Heart Rate (Exercise)  103 bpm    Heart Rate (Exit)  79 bpm    Rating of Perceived Exertion (Exercise)  9    Symptoms  none    Duration  Progress to 30 minutes of  aerobic without signs/symptoms of physical distress    Intensity  THRR unchanged      Progression   Progression  Continue to progress workloads to maintain intensity without signs/symptoms of physical distress.    Average METs  2.6      Resistance Training   Training Prescription  Yes    Weight  3lbs    Reps  10-15    Time  10 Minutes      Interval Training   Interval Training  No      Recumbant Bike   Level  3.5    Minutes  15    METs  2.6      NuStep   Level  2    SPM  85    Minutes  15    METs  --   Didn't report average     Home Exercise Plan   Plans to continue exercise at  Home (comment)   Walking   Frequency  Add 3 additional days to program exercise sessions.    Initial Home Exercises Provided  11/30/18       Nutrition:  Target Goals: Understanding of nutrition guidelines, daily intake of sodium 1500mg , cholesterol 200mg , calories 30% from fat and 7% or less from saturated fats, daily to have 5 or more servings of fruits and vegetables.  Biometrics: Pre Biometrics - 11/20/18 0932      Pre Biometrics   Height  5\' 5"  (1.651 m)    Weight  76.2 kg    Waist Circumference  33 inches    Hip Circumference  43 inches    Waist to Hip Ratio  0.77 %    BMI (Calculated)  27.96    Triceps Skinfold  35 mm    % Body Fat  39.5 %    Grip Strength  31 kg    Flexibility  14.5 in    Single Leg Stand  4.37 seconds        Nutrition Therapy Plan and Nutrition Goals: Nutrition Therapy & Goals - 06/18/18 1221      Nutrition Therapy   Diet  Heart healthy      Personal Nutrition Goals   Nutrition Goal  Pt to identify and limit food sources of sodium, saturated fat, trans fat, and refined  carbohydrates      Intervention Plan   Intervention  Prescribe, educate and counsel regarding individualized specific dietary modifications aiming towards targeted core components such as weight, hypertension, lipid management, diabetes, heart failure and other comorbidities.    Expected Outcomes  Short Term Goal: Understand basic principles of dietary content, such as calories, fat, sodium, cholesterol and nutrients.;Long Term Goal: Adherence to prescribed nutrition plan.       Nutrition Assessments: Nutrition Assessments - 06/18/18 1221      MEDFICTS Scores   Pre Score  --   pt did not complete survey at orientation      Nutrition Goals Re-Evaluation:   Nutrition Goals Re-Evaluation:   Nutrition Goals Discharge (Final Nutrition Goals Re-Evaluation):   Psychosocial: Target Goals: Acknowledge presence or absence of significant depression and/or stress,  maximize coping skills, provide positive support system. Participant is able to verbalize types and ability to use techniques and skills needed for reducing stress and depression.  Initial Review & Psychosocial Screening: Initial Psych Review & Screening - 11/20/18 1325      Initial Review   Current issues with  Current Stress Concerns      Family Dynamics   Good Support System?  Yes   Shequita has her sisters and friends for support     Barriers   Psychosocial barriers to participate in program  There are no identifiable barriers or psychosocial needs.      Screening Interventions   Interventions  Encouraged to exercise       Quality of Life Scores: Quality of Life - 11/20/18 1152      Quality of Life   Select  Quality of Life      Quality of Life Scores   Health/Function Pre  26.7 %    Socioeconomic Pre  29.06 %    Psych/Spiritual Pre  29.64 %    Family Pre  28.5 %    GLOBAL Pre  28.09 %      Scores of 19 and below usually indicate a poorer quality of life in these areas.  A difference of  2-3 points is a  clinically meaningful difference.  A difference of 2-3 points in the total score of the Quality of Life Index has been associated with significant improvement in overall quality of life, self-image, physical symptoms, and general health in studies assessing change in quality of life.  PHQ-9: Recent Review Flowsheet Data    Depression screen [facility] 2/9 11/20/2018 04/09/2018 08/16/2017 07/01/2016 03/09/2016   Decreased Interest 0 0 0 0 0   Down, Depressed, Hopeless 0 0 0 0 0   PHQ - 2 Score 0 0 0 0 0     Interpretation of Total Score  Total Score Depression Severity:  1-4 = Minimal depression, 5-9 = Mild depression, 10-14 = Moderate depression, 15-19 = Moderately severe depression, 20-27 = Severe depression   Psychosocial Evaluation and Intervention: Psychosocial Evaluation - 11/26/18 1446      Psychosocial Evaluation & Interventions   Interventions  Encouraged to exercise with the program and follow exercise prescription    Comments  No psychosocial interventions.    Expected Outcomes  Lucero will maintain a positive outlook with good coping skills.  Samarra enjoys reading, puzzles, and spending time with her family.    Continue Psychosocial Services   No Follow up required       Psychosocial Re-Evaluation: Psychosocial Re-Evaluation    [place] Name 12/10/18 1603             Psychosocial Re-Evaluation   Current issues with  Current Stress Concerns       Comments  Konnie reports some stress in her life but that it is manageable.       Expected Outcomes  Keviana will continue to report ability to manage her stress.       Interventions  Stress management education;Encouraged to attend Cardiac Rehabilitation for the exercise;Relaxation education       Continue Psychosocial Services   No Follow up required          Psychosocial Discharge (Final Psychosocial Re-Evaluation): Psychosocial Re-Evaluation - 12/10/18 1603      Psychosocial Re-Evaluation   Current issues with  Current Stress  Concerns    Comments  Keymani reports some stress in her life but that it is manageable.  Expected Outcomes  Shantaya will continue to report ability to manage her stress.    Interventions  Stress management education;Encouraged to attend Cardiac Rehabilitation for the exercise;Relaxation education    Continue Psychosocial Services   No Follow up required       Vocational Rehabilitation: Provide vocational rehab assistance to qualifying candidates.   Vocational Rehab Evaluation & Intervention: Vocational Rehab - 11/20/18 1326      Initial Vocational Rehab Evaluation & Intervention   Assessment shows need for Vocational Rehabilitation  No   Yatzary is currently working and does not need vocational rehab at this time      Education: Education Goals: Education classes will be provided on a weekly basis, covering required topics. Participant will state understanding/return demonstration of topics presented.  Learning Barriers/Preferences: Learning Barriers/Preferences - 11/20/18 1154      Learning Barriers/Preferences   Learning Barriers  Sight;Hearing   Memory deficits   Learning Preferences  Written Material;Skilled Demonstration       Education Topics: Count Your Pulse:  -Group instruction provided by verbal instruction, demonstration, patient participation and written materials to support subject.  Instructors address importance of being able to find your pulse and how to count your pulse when at home without a heart monitor.  Patients get hands on experience counting their pulse with staff help and individually.   Heart Attack, Angina, and Risk Factor Modification:  -Group instruction provided by verbal instruction, video, and written materials to support subject.  Instructors address signs and symptoms of angina and heart attacks.    Also discuss risk factors for heart disease and how to make changes to improve heart health risk factors.   Functional Fitness:  -Group  instruction provided by verbal instruction, demonstration, patient participation, and written materials to support subject.  Instructors address safety measures for doing things around the house.  Discuss how to get up and down off the floor, how to pick things up properly, how to safely get out of a chair without assistance, and balance training.   Meditation and Mindfulness:  -Group instruction provided by verbal instruction, patient participation, and written materials to support subject.  Instructor addresses importance of mindfulness and meditation practice to help reduce stress and improve awareness.  Instructor also leads participants through a meditation exercise.    Stretching for Flexibility and Mobility:  -Group instruction provided by verbal instruction, patient participation, and written materials to support subject.  Instructors lead participants through series of stretches that are designed to increase flexibility thus improving mobility.  These stretches are additional exercise for major muscle groups that are typically performed during regular warm up and cool down.   Hands Only CPR:  -Group verbal, video, and participation provides a basic overview of AHA guidelines for community CPR. Role-play of emergencies allow participants the opportunity to practice calling for help and chest compression technique with discussion of AED use.   Hypertension: -Group verbal and written instruction that provides a basic overview of hypertension including the most recent diagnostic guidelines, risk factor reduction with self-care instructions and medication management.    Nutrition I class: Heart Healthy Eating:  -Group instruction provided by PowerPoint slides, verbal discussion, and written materials to support subject matter. The instructor gives an explanation and review of the Therapeutic Lifestyle Changes diet recommendations, which includes a discussion on lipid goals, dietary fat,  sodium, fiber, plant stanol/sterol esters, sugar, and the components of a well-balanced, healthy diet.   Nutrition II class: Lifestyle Skills:  -Group instruction provided by  PowerPoint slides, verbal discussion, and written materials to support subject matter. The instructor gives an explanation and review of label reading, grocery shopping for heart health, heart healthy recipe modifications, and ways to make healthier choices when eating out.   Diabetes Question & Answer:  -Group instruction provided by PowerPoint slides, verbal discussion, and written materials to support subject matter. The instructor gives an explanation and review of diabetes co-morbidities, pre- and post-prandial blood glucose goals, pre-exercise blood glucose goals, signs, symptoms, and treatment of hypoglycemia and hyperglycemia, and foot care basics.   Diabetes Blitz:  -Group instruction provided by PowerPoint slides, verbal discussion, and written materials to support subject matter. The instructor gives an explanation and review of the physiology behind type 1 and type 2 diabetes, diabetes medications and rational behind using different medications, pre- and post-prandial blood glucose recommendations and Hemoglobin A1c goals, diabetes diet, and exercise including blood glucose guidelines for exercising safely.    Portion Distortion:  -Group instruction provided by PowerPoint slides, verbal discussion, written materials, and food models to support subject matter. The instructor gives an explanation of serving size versus portion size, changes in portions sizes over the last 20 years, and what consists of a serving from each food group.   Stress Management:  -Group instruction provided by verbal instruction, video, and written materials to support subject matter.  Instructors review role of stress in heart disease and how to cope with stress positively.     Exercising on Your Own:  -Group instruction provided by  verbal instruction, power point, and written materials to support subject.  Instructors discuss benefits of exercise, components of exercise, frequency and intensity of exercise, and end points for exercise.  Also discuss use of nitroglycerin and activating EMS.  Review options of places to exercise outside of rehab.  Review guidelines for sex with heart disease.   Cardiac Drugs I:  -Group instruction provided by verbal instruction and written materials to support subject.  Instructor reviews cardiac drug classes: antiplatelets, anticoagulants, beta blockers, and statins.  Instructor discusses reasons, side effects, and lifestyle considerations for each drug class.   Cardiac Drugs II:  -Group instruction provided by verbal instruction and written materials to support subject.  Instructor reviews cardiac drug classes: angiotensin converting enzyme inhibitors (ACE-I), angiotensin II receptor blockers (ARBs), nitrates, and calcium channel blockers.  Instructor discusses reasons, side effects, and lifestyle considerations for each drug class.   Anatomy and Physiology of the Circulatory System:  Group verbal and written instruction and models provide basic cardiac anatomy and physiology, with the coronary electrical and arterial systems. Review of: AMI, Angina, Valve disease, Heart Failure, Peripheral Artery Disease, Cardiac Arrhythmia, Pacemakers, and the ICD.   Other Education:  -Group or individual verbal, written, or video instructions that support the educational goals of the cardiac rehab program.   Holiday Eating Survival Tips:  -Group instruction provided by PowerPoint slides, verbal discussion, and written materials to support subject matter. The instructor gives patients tips, tricks, and techniques to help them not only survive but enjoy the holidays despite the onslaught of food that accompanies the holidays.   Knowledge Questionnaire Score: Knowledge Questionnaire Score - 11/26/18  1208      Knowledge Questionnaire Score   Pre Score  19/24       Core Components/Risk Factors/Patient Goals at Admission: Personal Goals and Risk Factors at Admission - 11/20/18 1154      Core Components/Risk Factors/Patient Goals on Admission   Hypertension  Yes    Intervention  Provide education on lifestyle modifcations including regular physical activity/exercise, weight management, moderate sodium restriction and increased consumption of fresh fruit, vegetables, and low fat dairy, alcohol moderation, and smoking cessation.;Monitor prescription use compliance.    Expected Outcomes  Short Term: Continued assessment and intervention until BP is < 140/31mm HG in hypertensive participants. < 130/49mm HG in hypertensive participants with diabetes, heart failure or chronic kidney disease.;Long Term: Maintenance of blood pressure at goal levels.       Core Components/Risk Factors/Patient Goals Review:  Goals and Risk Factor Review    Row Name 11/26/18 1447 12/10/18 1621           Core Components/Risk Factors/Patient Goals Review   Personal Goals Review  Weight Management/Obesity;Hypertension  Weight Management/Obesity;Hypertension      Review  Pt with few CAD RFs willing to participate in CR exercise. Pt would like to increase her strength and maintain this.  Pt with few CAD RFs willing to participate in CR exercise.  Patriciaann feels that she is increasing her strength in her legs.      Expected Outcomes  Pt will continue to participate in CR exercise, nutrition, and lifestyle modification opportunities.  Pt will continue to participate in CR exercise, nutrition, and lifestyle modification opportunities.         Core Components/Risk Factors/Patient Goals at Discharge (Final Review):  Goals and Risk Factor Review - 12/10/18 1621      Core Components/Risk Factors/Patient Goals Review   Personal Goals Review  Weight Management/Obesity;Hypertension    Review  Pt with few CAD RFs willing to  participate in CR exercise.  Eliany feels that she is increasing her strength in her legs.    Expected Outcomes  Pt will continue to participate in CR exercise, nutrition, and lifestyle modification opportunities.       ITP Comments: ITP Comments    Row Name 06/19/18 1509 11/20/18 0855 11/26/18 1446 12/10/18 1602     ITP Comments  30 day ITP. pt currently on hold for CR dept closure as part of COVID 19 precautions.   Medical Director- Dr. Fransico Him, MD  Pt started exercise today and tolerated it well.  30 Day ITP Review.  Zareth continues to tolerate exercise well.  She feels that she is increasing the strength in her legs.       Comments: See ITP Comments.

## 2018-12-14 ENCOUNTER — Encounter (HOSPITAL_COMMUNITY)
Admission: RE | Admit: 2018-12-14 | Discharge: 2018-12-14 | Disposition: A | Discharge status: Home / Self Care | Payer: Medicare Other | Source: Ambulatory Visit | Attending: Cardiology | Admitting: Cardiology

## 2018-12-14 ENCOUNTER — Other Ambulatory Visit: Payer: Self-pay

## 2018-12-14 DIAGNOSIS — Z9889 Other specified postprocedural states: Secondary | ICD-10-CM | POA: Diagnosis not present

## 2018-12-14 DIAGNOSIS — Z951 Presence of aortocoronary bypass graft: Secondary | ICD-10-CM | POA: Diagnosis not present

## 2018-12-17 ENCOUNTER — Encounter (HOSPITAL_COMMUNITY)
Admission: RE | Admit: 2018-12-17 | Discharge: 2018-12-17 | Disposition: A | Discharge status: Home / Self Care | Payer: Medicare Other | Source: Ambulatory Visit | Attending: Cardiology | Admitting: Cardiology

## 2018-12-17 ENCOUNTER — Other Ambulatory Visit: Payer: Self-pay

## 2018-12-17 DIAGNOSIS — Z951 Presence of aortocoronary bypass graft: Secondary | ICD-10-CM

## 2018-12-17 DIAGNOSIS — Z9889 Other specified postprocedural states: Secondary | ICD-10-CM | POA: Diagnosis not present

## 2018-12-17 DIAGNOSIS — I5022 Chronic systolic (congestive) heart failure: Secondary | ICD-10-CM | POA: Diagnosis not present

## 2018-12-18 DIAGNOSIS — R921 Mammographic calcification found on diagnostic imaging of breast: Secondary | ICD-10-CM | POA: Diagnosis not present

## 2018-12-18 LAB — BASIC METABOLIC PANEL
BUN/Creatinine Ratio: 24 (ref 12–28)
BUN: 16 mg/dL (ref 8–27)
CO2: 26 mmol/L (ref 20–29)
Calcium: 9 mg/dL (ref 8.7–10.3)
Chloride: 103 mmol/L (ref 96–106)
Creatinine, Ser: 0.67 mg/dL (ref 0.57–1.00)
GFR calc Af Amer: 104 mL/min/{1.73_m2} (ref >59)
GFR calc non Af Amer: 90 mL/min/{1.73_m2} (ref >59)
Glucose: 100 mg/dL — ABNORMAL HIGH (ref 65–99)
Potassium: 4.6 mmol/L (ref 3.5–5.2)
Sodium: 139 mmol/L (ref 134–144)

## 2018-12-18 LAB — HM MAMMOGRAPHY

## 2018-12-19 ENCOUNTER — Encounter (HOSPITAL_COMMUNITY)
Admission: RE | Admit: 2018-12-19 | Discharge: 2018-12-19 | Disposition: A | Discharge status: Home / Self Care | Payer: Medicare Other | Source: Ambulatory Visit | Attending: Cardiology | Admitting: Cardiology

## 2018-12-19 ENCOUNTER — Other Ambulatory Visit: Payer: Self-pay

## 2018-12-19 DIAGNOSIS — Z951 Presence of aortocoronary bypass graft: Secondary | ICD-10-CM | POA: Diagnosis not present

## 2018-12-19 DIAGNOSIS — Z9889 Other specified postprocedural states: Secondary | ICD-10-CM

## 2018-12-20 ENCOUNTER — Ambulatory Visit (INDEPENDENT_AMBULATORY_CARE_PROVIDER_SITE_OTHER): Payer: Medicare Other | Admitting: Cardiology

## 2018-12-20 VITALS — BP 108/70 | HR 89

## 2018-12-20 DIAGNOSIS — I5022 Chronic systolic (congestive) heart failure: Secondary | ICD-10-CM

## 2018-12-20 DIAGNOSIS — I25810 Atherosclerosis of coronary artery bypass graft(s) without angina pectoris: Secondary | ICD-10-CM

## 2018-12-20 DIAGNOSIS — Z9889 Other specified postprocedural states: Secondary | ICD-10-CM

## 2018-12-20 NOTE — Progress Notes (Signed)
Vitals:   12/20/18 1421  BP: 108/70  Pulse: 89    BMP Latest Ref Rng & Units 12/17/2018 11/16/2018 04/27/2018  Glucose 65 - 99 mg/dL 100(H) 98 124(H)  BUN 8 - 27 mg/dL 16 15 8   Creatinine 0.57 - 1.00 mg/dL 0.67 0.73 0.69  BUN/Creat Ratio 12 - 28 24 21 12   Sodium 134 - 144 mmol/L 139 139 137  Potassium 3.5 - 5.2 mmol/L 4.6 4.4 4.1  Chloride 96 - 106 mmol/L 103 102 100  CO2 20 - 29 mmol/L 26 27 22   Calcium 8.7 - 10.3 mg/dL 9.0 8.6(L) 8.9    Labs and vitals reviewed with the patient. No change made to Entresto dose today. F/u in 4 weeks.  Nigel Mormon, MD

## 2018-12-21 ENCOUNTER — Encounter (HOSPITAL_COMMUNITY)
Admission: RE | Admit: 2018-12-21 | Discharge: 2018-12-21 | Disposition: A | Discharge status: Home / Self Care | Payer: Medicare Other | Source: Ambulatory Visit | Attending: Cardiology | Admitting: Cardiology

## 2018-12-21 ENCOUNTER — Other Ambulatory Visit: Payer: Self-pay

## 2018-12-21 DIAGNOSIS — Z9889 Other specified postprocedural states: Secondary | ICD-10-CM

## 2018-12-21 DIAGNOSIS — Z951 Presence of aortocoronary bypass graft: Secondary | ICD-10-CM

## 2018-12-21 MED ORDER — ENTRESTO 24-26 MG PO TABS: 1.0000 | ORAL_TABLET | Freq: Two times a day (BID) | ORAL | 1 refills | Status: DC

## 2018-12-24 ENCOUNTER — Encounter (HOSPITAL_COMMUNITY)
Admission: RE | Admit: 2018-12-24 | Discharge: 2018-12-24 | Disposition: A | Discharge status: Home / Self Care | Payer: Medicare Other | Source: Ambulatory Visit | Attending: Cardiology | Admitting: Cardiology

## 2018-12-24 ENCOUNTER — Other Ambulatory Visit: Payer: Self-pay

## 2018-12-24 DIAGNOSIS — Z951 Presence of aortocoronary bypass graft: Secondary | ICD-10-CM

## 2018-12-24 DIAGNOSIS — Z9889 Other specified postprocedural states: Secondary | ICD-10-CM | POA: Diagnosis not present

## 2018-12-25 ENCOUNTER — Other Ambulatory Visit: Payer: Self-pay

## 2018-12-26 ENCOUNTER — Other Ambulatory Visit: Payer: Self-pay

## 2018-12-26 ENCOUNTER — Encounter (HOSPITAL_COMMUNITY)
Admission: RE | Admit: 2018-12-26 | Discharge: 2018-12-26 | Disposition: A | Discharge status: Home / Self Care | Payer: Medicare Other | Source: Ambulatory Visit | Attending: Cardiology | Admitting: Cardiology

## 2018-12-26 DIAGNOSIS — Z951 Presence of aortocoronary bypass graft: Secondary | ICD-10-CM | POA: Diagnosis not present

## 2018-12-26 DIAGNOSIS — Z9889 Other specified postprocedural states: Secondary | ICD-10-CM

## 2018-12-28 ENCOUNTER — Other Ambulatory Visit: Payer: Self-pay

## 2018-12-28 ENCOUNTER — Encounter (HOSPITAL_COMMUNITY)
Admission: RE | Admit: 2018-12-28 | Discharge: 2018-12-28 | Disposition: A | Discharge status: Home / Self Care | Payer: Medicare Other | Source: Ambulatory Visit | Attending: Cardiology | Admitting: Cardiology

## 2018-12-28 DIAGNOSIS — Z951 Presence of aortocoronary bypass graft: Secondary | ICD-10-CM | POA: Diagnosis not present

## 2018-12-28 DIAGNOSIS — Z9889 Other specified postprocedural states: Secondary | ICD-10-CM | POA: Insufficient documentation

## 2018-12-31 ENCOUNTER — Other Ambulatory Visit: Payer: Self-pay

## 2018-12-31 ENCOUNTER — Encounter (HOSPITAL_COMMUNITY)
Admission: RE | Admit: 2018-12-31 | Discharge: 2018-12-31 | Disposition: A | Discharge status: Home / Self Care | Payer: Medicare Other | Source: Ambulatory Visit | Attending: Cardiology | Admitting: Cardiology

## 2018-12-31 DIAGNOSIS — Z9889 Other specified postprocedural states: Secondary | ICD-10-CM | POA: Diagnosis not present

## 2018-12-31 DIAGNOSIS — Z951 Presence of aortocoronary bypass graft: Secondary | ICD-10-CM | POA: Diagnosis not present

## 2019-01-01 NOTE — Telephone Encounter (Signed)
Medication was sent in   

## 2019-01-02 ENCOUNTER — Encounter (HOSPITAL_COMMUNITY)
Admission: RE | Admit: 2019-01-02 | Discharge: 2019-01-02 | Disposition: A | Discharge status: Home / Self Care | Payer: Medicare Other | Source: Ambulatory Visit | Attending: Cardiology | Admitting: Cardiology

## 2019-01-02 ENCOUNTER — Other Ambulatory Visit: Payer: Self-pay

## 2019-01-02 DIAGNOSIS — Z9889 Other specified postprocedural states: Secondary | ICD-10-CM | POA: Diagnosis not present

## 2019-01-02 DIAGNOSIS — Z951 Presence of aortocoronary bypass graft: Secondary | ICD-10-CM | POA: Diagnosis not present

## 2019-01-04 ENCOUNTER — Other Ambulatory Visit: Payer: Self-pay

## 2019-01-04 ENCOUNTER — Encounter (HOSPITAL_COMMUNITY)
Admission: RE | Admit: 2019-01-04 | Discharge: 2019-01-04 | Disposition: A | Discharge status: Home / Self Care | Payer: Medicare Other | Source: Ambulatory Visit | Attending: Cardiology | Admitting: Cardiology

## 2019-01-04 DIAGNOSIS — Z951 Presence of aortocoronary bypass graft: Secondary | ICD-10-CM

## 2019-01-04 DIAGNOSIS — Z9889 Other specified postprocedural states: Secondary | ICD-10-CM

## 2019-01-07 ENCOUNTER — Other Ambulatory Visit: Payer: Self-pay

## 2019-01-07 ENCOUNTER — Encounter (HOSPITAL_COMMUNITY)
Admission: RE | Admit: 2019-01-07 | Discharge: 2019-01-07 | Disposition: A | Discharge status: Home / Self Care | Payer: Medicare Other | Source: Ambulatory Visit | Attending: Cardiology | Admitting: Cardiology

## 2019-01-07 VITALS — BP 120/80 | HR 77 | Temp 97.2°F | Ht 65.0 in | Wt 170.6 lb

## 2019-01-07 DIAGNOSIS — Z951 Presence of aortocoronary bypass graft: Secondary | ICD-10-CM | POA: Diagnosis not present

## 2019-01-07 DIAGNOSIS — Z9889 Other specified postprocedural states: Secondary | ICD-10-CM

## 2019-01-09 ENCOUNTER — Encounter (HOSPITAL_COMMUNITY): Payer: Medicare Other

## 2019-01-10 ENCOUNTER — Encounter (HOSPITAL_COMMUNITY): Payer: Self-pay

## 2019-01-10 DIAGNOSIS — Z9889 Other specified postprocedural states: Secondary | ICD-10-CM

## 2019-01-10 DIAGNOSIS — Z951 Presence of aortocoronary bypass graft: Secondary | ICD-10-CM

## 2019-01-10 NOTE — Progress Notes (Signed)
Cardiac Individual Treatment Plan  Patient Details  Name: Tami Wood MRN: [id number] Date of Birth: [birth year] Referring Provider:     [facility] from 11/20/2018 in [place]  Referring Provider  Patwardhan, Reynold Bowen, MD      Initial Encounter Date:    CARDIAC REHAB PHASE II ORIENTATION from 11/20/2018 in [place]  Date  11/20/18      Visit Diagnosis: 04/10/2018 MV Repair  04/10/2018 CABG x2  Patient's Home Medications on Admission:  Current Outpatient Medications:  .  amoxicillin (AMOXIL) 500 MG capsule, Take 2 capsules (1,000 mg total) by mouth 2 (two) times daily. (Patient taking differently: Take 1,000 mg by mouth 2 (two) times daily. Prior to dental), Disp: 40 capsule, Rfl: 0 .  aspirin 81 MG EC tablet, Take 81 mg by mouth daily.  , Disp: , Rfl:  .  atorvastatin (LIPITOR) 80 MG tablet, TAKE 1 TABLET BY MOUTH DAILY AT 6 PM, Disp: 90 tablet, Rfl: 3 .  furosemide (LASIX) 20 MG tablet, Take 1 tablet (20 mg total) by mouth daily as needed., Disp: 30 tablet, Rfl: 2 .  metoprolol succinate (TOPROL XL) 25 MG 24 hr tablet, Take 1 tablet (25 mg total) by mouth daily., Disp: 30 tablet, Rfl: 3 .  sacubitril-valsartan (ENTRESTO) 24-26 MG, Take 1 tablet by mouth 2 (two) times daily., Disp: 60 tablet, Rfl: 1  Current Facility-Administered Medications:  .  sacubitril-valsartan (ENTRESTO) 24-26 mg per tablet, 1 tablet, Oral, BID, Patwardhan, Reynold Bowen, MD  Past Medical History: Past Medical History:  Diagnosis Date  . ALLERGIC RHINITIS 06/18/2009  . ANEMIA-IRON DEFICIENCY 11/28/2006  . ANXIETY 11/28/2006  . Arthritis   . Coronary artery disease   . Cough 06/18/2009  . DEPRESSION 11/28/2006  . GERD 12/10/2007  . History of kidney stones   . HYPERTENSION 11/28/2006  . Mitral regurgitation   . MURMUR 11/28/2006  . PEPTIC ULCER DISEASE 12/10/2007  . 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 S/P mitral valve repair 04/10/2018   Complex valvuloplasty including artificial Gore-tex neochord placement x8 and Sorin Memo 4D ring annuloplasty, size 28    Tobacco Use: Social History   Tobacco Use  Smoking Status Never Smoker  Smokeless Tobacco Never Used    Labs: Recent Review Flowsheet Data    Labs for ITP Cardiac and Pulmonary Rehab Latest Ref Rng & Units 04/10/2018 04/10/2018 04/10/2018 04/10/2018 04/11/2018   Cholestrol 0 - 200 mg/dL - - - - -   LDLCALC 0 - 99 mg/dL - - - - -   HDL >39.00 mg/dL - - - - -   Trlycerides 0.0 - 149.0 mg/dL - - - - -   Hemoglobin A1c 4.8 - 5.6 % - - - - -   PHART 7.350 - 7.450 7.378 7.293(L) - 7.319(L) -   PCO2ART 32.0 - 48.0 mmHg 38.2 42.6 - 45.9 -   HCO3 20.0 - 28.0 mmol/L 23.1 20.8 - 23.8 -   TCO2 22 - 32 mmol/L 24 22 25 25 23    ACIDBASEDEF 0.0 - 2.0 mmol/L 2.0 5.0(H) - 2.0 -   O2SAT % 99.0 99.0 - 99.0 -      Capillary Blood Glucose: Lab Results  Component Value Date   GLUCAP 136 (H) 04/12/2018   GLUCAP 109 (H) 04/12/2018   GLUCAP 130 (H) 04/11/2018   GLUCAP 153 (H) 04/11/2018  GLUCAP 166 (H) 04/11/2018     Exercise Target Goals: Exercise Program Goal: Individual exercise prescription set using results from initial 6 min walk test and THRR while considering  patient's activity barriers and safety.   Exercise Prescription Goal: Starting with aerobic activity 30 plus minutes a day, 3 days per week for initial exercise prescription. Provide home exercise prescription and guidelines that participant acknowledges understanding prior to discharge.  Activity Barriers & Risk Stratification: Activity Barriers & Cardiac Risk Stratification - 11/20/18 1200      Activity Barriers & Cardiac Risk Stratification   Activity Barriers  Arthritis    Comments  Arthritis- right knee    Cardiac Risk Stratification  High       6 Minute Walk: 6 Minute Walk    Row Name 11/20/18 0942 01/07/19 1102       6 Minute Walk   Phase   Initial  Discharge    Distance  1408 feet  1514 feet    Distance % Change  -  7.53 %    Walk Time  6 minutes  6 minutes    # of Rest Breaks  0  0    MPH  2.67  2.87    METS  3.09  3.23    RPE  11  13    Perceived Dyspnea   0  0    VO2 Peak  10.81  11.3    Symptoms  No  No    Resting HR  67 bpm  77 bpm    Resting BP  104/60  120/80    Resting Oxygen Saturation   100 %  -    Exercise Oxygen Saturation  during 6 min walk  100 %  -    Max Ex. HR  93 bpm  112 bpm    Max Ex. BP  142/68  122/72    2 Minute Post BP  132/64  110/68       Oxygen Initial Assessment:   Oxygen Re-Evaluation:   Oxygen Discharge (Final Oxygen Re-Evaluation):   Initial Exercise Prescription: Initial Exercise Prescription - 11/20/18 1100      Date of Initial Exercise RX and Referring Provider   Date  11/20/18    Referring Provider  Nigel Mormon, MD    Expected Discharge Date  01/04/19      Recumbant Bike   Level  2    Watts  27    Minutes  15    METs  3.2      NuStep   Level  2    SPM  85    Minutes  15    METs  2.7      Prescription Details   Frequency (times per week)  3    Duration  Progress to 30 minutes of continuous aerobic without signs/symptoms of physical distress      Intensity   THRR 40-80% of Max Heartrate  60-121    Ratings of Perceived Exertion  11-13    Perceived Dyspnea  0-4      Progression   Progression  Continue to progress workloads to maintain intensity without signs/symptoms of physical distress.      Resistance Training   Training Prescription  Yes    Weight  3lbs    Reps  10-15       Perform Capillary Blood Glucose checks as needed.  Exercise Prescription Changes: Exercise Prescription Changes    Row Name 11/26/18 1139 12/10/18 1047 12/24/18 1047 01/07/19  1050       Response to Exercise   Blood Pressure (Admit)  124/80  104/60  118/68  120/80    Blood Pressure (Exercise)  156/82  130/82  122/68  122/72    Blood Pressure (Exit)  118/82   120/76  114/70  110/68    Heart Rate (Admit)  86 bpm  71 bpm  82 bpm  77 bpm    Heart Rate (Exercise)  100 bpm  103 bpm  105 bpm  112 bpm    Heart Rate (Exit)  83 bpm  79 bpm  82 bpm  85 bpm    Rating of Perceived Exertion (Exercise)  12  9  11  13     Symptoms  none  none  none  none    Comments  Patient tolerated 1st exercise session well.  -  -  -    Duration  Progress to 30 minutes of  aerobic without signs/symptoms of physical distress  Progress to 30 minutes of  aerobic without signs/symptoms of physical distress  Progress to 30 minutes of  aerobic without signs/symptoms of physical distress  Progress to 30 minutes of  aerobic without signs/symptoms of physical distress    Intensity  THRR unchanged  THRR unchanged  THRR unchanged  THRR unchanged      Progression   Progression  Continue to progress workloads to maintain intensity without signs/symptoms of physical distress.  Continue to progress workloads to maintain intensity without signs/symptoms of physical distress.  Continue to progress workloads to maintain intensity without signs/symptoms of physical distress.  Continue to progress workloads to maintain intensity without signs/symptoms of physical distress.    Average METs  2.2  2.6  2.8  3.2      Resistance Training   Training Prescription  Yes  Yes  Yes  No Post measurements, education test, and QOL survey.    Weight  3lbs  3lbs  4lbs  -    Reps  10-15  10-15  10-15  -    Time  10 Minutes  10 Minutes  10 Minutes  -      Interval Training   Interval Training  No  No  No  No      Recumbant Bike   Level  2  3.5  3.5  3.5    Minutes  15  15  15  15     METs  2  2.6  2.7  2.7      NuStep   Level  2  2  4  5     SPM  85  85  85  85    Minutes  15  15  15  15     METs  2.3  - Didn't report average  0.8  3.6      Track   Laps  -  -  -  7 1514 ft    Minutes  -  -  -  6 Walk Test    METs  -  -  -  3.2      Home Exercise Plan   Plans to continue exercise at  -  Home (comment)  Walking  Home (comment) Walking  Home (comment) Walking    Frequency  -  Add 3 additional days to program exercise sessions.  Add 3 additional days to program exercise sessions.  Add 4 additional days to program exercise sessions.    Initial Home Exercises Provided  -  11/30/18  11/30/18  11/30/18       Exercise Comments: Exercise Comments    Row Name 11/26/18 1218 11/30/18 1104 12/10/18 1100 12/26/18 1100 01/07/19 1139   Exercise Comments  Patient tolerated low intensity exercise well without symtpoms.  Reviewed home exercise guidelines and goals with patient.  Reviewed METs and goals with patient.  Reviewed METs with patient.  Reviewed goals with patient.      Exercise Goals and Review: Exercise Goals    Row Name 11/20/18 1200             Exercise Goals   Increase Physical Activity  Yes       Intervention  Provide advice, education, support and counseling about physical activity/exercise needs.;Develop an individualized exercise prescription for aerobic and resistive training based on initial evaluation findings, risk stratification, comorbidities and participant's personal goals.       Expected Outcomes  Short Term: Attend rehab on a regular basis to increase amount of physical activity.;Long Term: Exercising regularly at least 3-5 days a week.;Long Term: Add in home exercise to make exercise part of routine and to increase amount of physical activity.       Increase Strength and Stamina  Yes       Intervention  Provide advice, education, support and counseling about physical activity/exercise needs.;Develop an individualized exercise prescription for aerobic and resistive training based on initial evaluation findings, risk stratification, comorbidities and participant's personal goals.       Expected Outcomes  Short Term: Increase workloads from initial exercise prescription for resistance, speed, and METs.;Short Term: Perform resistance training exercises routinely during rehab and  add in resistance training at home;Long Term: Improve cardiorespiratory fitness, muscular endurance and strength as measured by increased METs and functional capacity (6MWT)       Able to understand and use rate of perceived exertion (RPE) scale  Yes       Intervention  Provide education and explanation on how to use RPE scale       Expected Outcomes  Short Term: Able to use RPE daily in rehab to express subjective intensity level;Long Term:  Able to use RPE to guide intensity level when exercising independently       Knowledge and understanding of Target Heart Rate Range (THRR)  Yes       Intervention  Provide education and explanation of THRR including how the numbers were predicted and where they are located for reference       Expected Outcomes  Short Term: Able to state/look up THRR;Long Term: Able to use THRR to govern intensity when exercising independently;Short Term: Able to use daily as guideline for intensity in rehab       Able to check pulse independently  Yes       Intervention  Provide education and demonstration on how to check pulse in carotid and radial arteries.;Review the importance of being able to check your own pulse for safety during independent exercise       Expected Outcomes  Short Term: Able to explain why pulse checking is important during independent exercise;Long Term: Able to check pulse independently and accurately       Understanding of Exercise Prescription  Yes       Intervention  Provide education, explanation, and written materials on patient's individual exercise prescription       Expected Outcomes  Short Term: Able to explain program exercise prescription;Long Term: Able to explain home exercise prescription to exercise independently  Exercise Goals Re-Evaluation : Exercise Goals Re-Evaluation    Row Name 11/26/18 1218 11/30/18 1104 12/10/18 1100 01/07/19 1139       Exercise Goal Re-Evaluation   Exercise Goals Review  Increase Physical  Activity;Able to understand and use rate of perceived exertion (RPE) scale;Increase Strength and Stamina  Increase Physical Activity;Able to understand and use rate of perceived exertion (RPE) scale;Increase Strength and Stamina;Understanding of Exercise Prescription  Increase Physical Activity;Able to understand and use rate of perceived exertion (RPE) scale;Increase Strength and Stamina;Understanding of Exercise Prescription  Increase Physical Activity;Able to understand and use rate of perceived exertion (RPE) scale;Increase Strength and Stamina;Understanding of Exercise Prescription;Knowledge and understanding of Target Heart Rate Range (THRR)    Comments  Patient able to understand and use RPE scale appropriately.  Reviewed home exercise guidelines with patient. Reviewed pulse coutning and handout given.  Patient states that her legs feel better since she's started exercise in the cardiac rehab program. Pt is walking at least 30 minutes, 1-2 days at home.  Patient states she may need to graduate from the program early due to personal matters. Exit test complete. Functional capacity increased 8%, as measured by 6MWT and strength increased 21% as meaured by grip strength test. Patient has progressed well, achieving 3.2 METs with exercise. Pt is currently walking on the days she doesn't attend cardiac rehab and plans to continue walking as her mode of exercise upon completion of the program.    Expected Outcomes  Increase workloads as tolerated to help increase strength and stamina.  Patient will walk 30 minutes 1-2 days/week at home in addition to exercise at cardiac rehab to help increase strength and stamina.  Increase workloads as tolerated to help improve cardiorespiratory fitness.  Patient will continue walking 4-7 days/week to maintain health and fitness gains.        Discharge Exercise Prescription (Final Exercise Prescription Changes): Exercise Prescription Changes - 01/07/19 1050      Response  to Exercise   Blood Pressure (Admit)  120/80    Blood Pressure (Exercise)  122/72    Blood Pressure (Exit)  110/68    Heart Rate (Admit)  77 bpm    Heart Rate (Exercise)  112 bpm    Heart Rate (Exit)  85 bpm    Rating of Perceived Exertion (Exercise)  13    Symptoms  none    Duration  Progress to 30 minutes of  aerobic without signs/symptoms of physical distress    Intensity  THRR unchanged      Progression   Progression  Continue to progress workloads to maintain intensity without signs/symptoms of physical distress.    Average METs  3.2      Resistance Training   Training Prescription  No   Post measurements, education test, and QOL survey.     Interval Training   Interval Training  No      Recumbant Bike   Level  3.5    Minutes  15    METs  2.7      NuStep   Level  5    SPM  85    Minutes  15    METs  3.6      Track   Laps  7   1514 ft   Minutes  6   Walk Test   METs  3.2      Home Exercise Plan   Plans to continue exercise at  Home (comment)   Walking   Frequency  Add  4 additional days to program exercise sessions.    Initial Home Exercises Provided  11/30/18       Nutrition:  Target Goals: Understanding of nutrition guidelines, daily intake of sodium 1500mg , cholesterol 200mg , calories 30% from fat and 7% or less from saturated fats, daily to have 5 or more servings of fruits and vegetables.  Biometrics: Pre Biometrics - 11/20/18 0932      Pre Biometrics   Height  5\' 5"  (1.651 m)    Weight  167 lb 15.9 oz (76.2 kg)    Waist Circumference  33 inches    Hip Circumference  43 inches    Waist to Hip Ratio  0.77 %    BMI (Calculated)  27.96    Triceps Skinfold  35 mm    % Body Fat  39.5 %    Grip Strength  31 kg    Flexibility  14.5 in    Single Leg Stand  4.37 seconds      Post Biometrics - 01/07/19 1110       Post  Biometrics   Height  5\' 5"  (1.651 m)    Weight  170 lb 10.2 oz (77.4 kg)    Waist Circumference  34.25 inches    Hip  Circumference  44.25 inches    Waist to Hip Ratio  0.77 %    BMI (Calculated)  28.4    Triceps Skinfold  23 mm    % Body Fat  38 %    Grip Strength  37.5 kg    Flexibility  16.5 in    Single Leg Stand  4.5 seconds       Nutrition Therapy Plan and Nutrition Goals:   Nutrition Assessments:   Nutrition Goals Re-Evaluation:   Nutrition Goals Discharge (Final Nutrition Goals Re-Evaluation):   Psychosocial: Target Goals: Acknowledge presence or absence of significant depression and/or stress, maximize coping skills, provide positive support system. Participant is able to verbalize types and ability to use techniques and skills needed for reducing stress and depression.  Initial Review & Psychosocial Screening: Initial Psych Review & Screening - 11/20/18 1325      Initial Review   Current issues with  Current Stress Concerns      Family Dynamics   Good Support System?  Yes   Mystie has her sisters and friends for support     Barriers   Psychosocial barriers to participate in program  There are no identifiable barriers or psychosocial needs.      Screening Interventions   Interventions  Encouraged to exercise       Quality of Life Scores: Quality of Life - 01/07/19 1717      Quality of Life   Select  Quality of Life      Quality of Life Scores   Health/Function Pre  26.7 %    Health/Function Post  29.2 %    Health/Function % Change  9.36 %    Socioeconomic Pre  29.06 %    Socioeconomic Post  30 %    Socioeconomic % Change   3.23 %    Psych/Spiritual Pre  29.64 %    Psych/Spiritual Post  30 %    Psych/Spiritual % Change  1.21 %    Family Pre  28.5 %    Family Post  30 %    Family % Change  5.26 %    GLOBAL Pre  28.09 %    GLOBAL Post  29.66 %    GLOBAL %  Change  5.59 %      Scores of 19 and below usually indicate a poorer quality of life in these areas.  A difference of  2-3 points is a clinically meaningful difference.  A difference of 2-3 points in the  total score of the Quality of Life Index has been associated with significant improvement in overall quality of life, self-image, physical symptoms, and general health in studies assessing change in quality of life.  PHQ-9: Recent Review Flowsheet Data    Depression screen [facility] 2/9 11/20/2018 04/09/2018 08/16/2017 07/01/2016 03/09/2016   Decreased Interest 0 0 0 0 0   Down, Depressed, Hopeless 0 0 0 0 0   PHQ - 2 Score 0 0 0 0 0     Interpretation of Total Score  Total Score Depression Severity:  1-4 = Minimal depression, 5-9 = Mild depression, 10-14 = Moderate depression, 15-19 = Moderately severe depression, 20-27 = Severe depression   Psychosocial Evaluation and Intervention: Psychosocial Evaluation - 11/26/18 1446      Psychosocial Evaluation & Interventions   Interventions  Encouraged to exercise with the program and follow exercise prescription    Comments  No psychosocial interventions.    Expected Outcomes  Verenise will maintain a positive outlook with good coping skills.  Diamon enjoys reading, puzzles, and spending time with her family.    Continue Psychosocial Services   No Follow up required       Psychosocial Re-Evaluation: Psychosocial Re-Evaluation    [place] Name 12/10/18 1603 01/04/19 1357           Psychosocial Re-Evaluation   Current issues with  Current Stress Concerns  Current Stress Concerns      Comments  Elowyn reports some stress in her life but that it is manageable.  Sabirin reports some stress in her life but that it is manageable.      Expected Outcomes  Nyara will continue to report ability to manage her stress.  Tieisha will continue to report ability to manage her stress.      Interventions  Stress management education;Encouraged to attend Cardiac Rehabilitation for the exercise;Relaxation education  Stress management education;Encouraged to attend Cardiac Rehabilitation for the exercise;Relaxation education      Continue Psychosocial Services   No  Follow up required  No Follow up required         Psychosocial Discharge (Final Psychosocial Re-Evaluation): Psychosocial Re-Evaluation - 01/04/19 1357      Psychosocial Re-Evaluation   Current issues with  Current Stress Concerns    Comments  Allaina reports some stress in her life but that it is manageable.    Expected Outcomes  Samra will continue to report ability to manage her stress.    Interventions  Stress management education;Encouraged to attend Cardiac Rehabilitation for the exercise;Relaxation education    Continue Psychosocial Services   No Follow up required       Vocational Rehabilitation: Provide vocational rehab assistance to qualifying candidates.   Vocational Rehab Evaluation & Intervention: Vocational Rehab - 11/20/18 1326      Initial Vocational Rehab Evaluation & Intervention   Assessment shows need for Vocational Rehabilitation  No   Anwitha is currently working and does not need vocational rehab at this time      Education: Education Goals: Education classes will be provided on a weekly basis, covering required topics. Participant will state understanding/return demonstration of topics presented.  Learning Barriers/Preferences: Learning Barriers/Preferences - 11/20/18 1154      Learning Barriers/Preferences  Learning Barriers  Sight;Hearing   Memory deficits   Learning Preferences  Written Material;Skilled Demonstration       Education Topics: Hypertension, Hypertension Reduction -Define heart disease and high blood pressure. Discus how high blood pressure affects the body and ways to reduce high blood pressure.   Exercise and Your Heart -Discuss why it is important to exercise, the FITT principles of exercise, normal and abnormal responses to exercise, and how to exercise safely.   Angina -Discuss definition of angina, causes of angina, treatment of angina, and how to decrease risk of having angina.   Cardiac Medications -Review what  the following cardiac medications are used for, how they affect the body, and side effects that may occur when taking the medications.  Medications include Aspirin, Beta blockers, calcium channel blockers, ACE Inhibitors, angiotensin receptor blockers, diuretics, digoxin, and antihyperlipidemics.   Congestive Heart Failure -Discuss the definition of CHF, how to live with CHF, the signs and symptoms of CHF, and how keep track of weight and sodium intake.   Heart Disease and Intimacy -Discus the effect sexual activity has on the heart, how changes occur during intimacy as we age, and safety during sexual activity.   Smoking Cessation / COPD -Discuss different methods to quit smoking, the health benefits of quitting smoking, and the definition of COPD.   Nutrition I: Fats -Discuss the types of cholesterol, what cholesterol does to the heart, and how cholesterol levels can be controlled.   Nutrition II: Labels -Discuss the different components of food labels and how to read food label   Heart Parts/Heart Disease and PAD -Discuss the anatomy of the heart, the pathway of blood circulation through the heart, and these are affected by heart disease.   Stress I: Signs and Symptoms -Discuss the causes of stress, how stress may lead to anxiety and depression, and ways to limit stress.   Stress II: Relaxation -Discuss different types of relaxation techniques to limit stress.   Warning Signs of Stroke / TIA -Discuss definition of a stroke, what the signs and symptoms are of a stroke, and how to identify when someone is having stroke.   Knowledge Questionnaire Score: Knowledge Questionnaire Score - 01/07/19 1718      Knowledge Questionnaire Score   Pre Score  19/24    Post Score  20/24       Core Components/Risk Factors/Patient Goals at Admission: Personal Goals and Risk Factors at Admission - 11/20/18 1154      Core Components/Risk Factors/Patient Goals on Admission    Hypertension  Yes    Intervention  Provide education on lifestyle modifcations including regular physical activity/exercise, weight management, moderate sodium restriction and increased consumption of fresh fruit, vegetables, and low fat dairy, alcohol moderation, and smoking cessation.;Monitor prescription use compliance.    Expected Outcomes  Short Term: Continued assessment and intervention until BP is < 140/69mm HG in hypertensive participants. < 130/21mm HG in hypertensive participants with diabetes, heart failure or chronic kidney disease.;Long Term: Maintenance of blood pressure at goal levels.       Core Components/Risk Factors/Patient Goals Review:  Goals and Risk Factor Review    Row Name 11/26/18 1447 12/10/18 1621 01/04/19 1354         Core Components/Risk Factors/Patient Goals Review   Personal Goals Review  Weight Management/Obesity;Hypertension  Weight Management/Obesity;Hypertension  Weight Management/Obesity;Hypertension     Review  Pt with few CAD RFs willing to participate in CR exercise. Pt would like to increase her strength and maintain this.  Pt with few CAD RFs willing to participate in CR exercise.  Sherie feels that she is increasing her strength in her legs.  Pt with few CAD RFs willing to participate in CR exercise.  Aryan continues to tolerate exercise well with increased workloads.  She continues to feel that she is increasing the strength in her legs.  She will graduate on 01/18/2019.     Expected Outcomes  Pt will continue to participate in CR exercise, nutrition, and lifestyle modification opportunities.  Pt will continue to participate in CR exercise, nutrition, and lifestyle modification opportunities.  Pt will continue to participate in CR exercise, nutrition, and lifestyle modification opportunities.        Core Components/Risk Factors/Patient Goals at Discharge (Final Review):  Goals and Risk Factor Review - 01/04/19 1354      Core Components/Risk  Factors/Patient Goals Review   Personal Goals Review  Weight Management/Obesity;Hypertension    Review  Pt with few CAD RFs willing to participate in CR exercise.  [place] continues to tolerate exercise well with increased workloads.  She continues to feel that she is increasing the strength in her legs.  She will graduate on 01/18/2019.    Expected Outcomes  Pt will continue to participate in CR exercise, nutrition, and lifestyle modification opportunities.       ITP Comments: ITP Comments    Row Name 11/20/18 0855 11/26/18 1446 12/10/18 1602 01/04/19 1350     ITP Comments  Medical Director- Dr. Fransico Him, MD  Pt started exercise today and tolerated it well.  30 Day ITP Review.  Nel continues to tolerate exercise well.  She feels that she is increasing the strength in her legs.  30 Day ITP Review.  [place] continues to tolerate exercise well with increased workloads.  She continues to feel that she is increasing the strength in her legs.  She will graduate on 01/18/2019.       Comments: See ITP comments

## 2019-01-11 ENCOUNTER — Encounter (HOSPITAL_COMMUNITY): Payer: Medicare Other

## 2019-01-14 ENCOUNTER — Encounter (HOSPITAL_COMMUNITY): Payer: Medicare Other

## 2019-01-16 ENCOUNTER — Encounter (HOSPITAL_COMMUNITY): Payer: Medicare Other
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
## 2019-01-18 ENCOUNTER — Encounter (HOSPITAL_COMMUNITY): Payer: Medicare Other

## 2019-01-24 ENCOUNTER — Ambulatory Visit: Payer: Medicare Other | Admitting: Cardiology

## 2019-01-31 ENCOUNTER — Other Ambulatory Visit: Payer: Self-pay

## 2019-01-31 ENCOUNTER — Encounter: Payer: Self-pay | Admitting: Cardiology

## 2019-01-31 ENCOUNTER — Ambulatory Visit (INDEPENDENT_AMBULATORY_CARE_PROVIDER_SITE_OTHER): Payer: Medicare Other | Admitting: Cardiology

## 2019-01-31 VITALS — BP 126/80 | HR 71 | Temp 97.9°F | Ht 65.0 in | Wt 171.2 lb

## 2019-01-31 DIAGNOSIS — I5022 Chronic systolic (congestive) heart failure: Secondary | ICD-10-CM

## 2019-01-31 DIAGNOSIS — I25810 Atherosclerosis of coronary artery bypass graft(s) without angina pectoris: Secondary | ICD-10-CM | POA: Diagnosis not present

## 2019-01-31 DIAGNOSIS — Z9889 Other specified postprocedural states: Secondary | ICD-10-CM

## 2019-01-31 NOTE — Progress Notes (Signed)
Follow up visit  Subjective:   Tami Wood, female    DOB: 1950/04/19, 68 y.o.   MRN: [id number]  Chief Complaint  Patient presents with  . Congestive Heart Failure  . Follow-up    1 month    HPI  68 y/o [place] American female with mitral valve prolapse, CAD, now s/p mitral valve repair, CABGX2 (03/2018 by Dr. Roxy Manns), hypertension, here for follow up.  She is tolerating Entresto well. She denies chest pain, shortness of breath, palpitations, leg edema, orthopnea, PND, TIA/syncope. She has "sqiggly lines" in front of her eyes from time to time.   Past Medical History:  Diagnosis Date  . ALLERGIC RHINITIS 06/18/2009  . ANEMIA-IRON DEFICIENCY 11/28/2006  . ANXIETY 11/28/2006  . Arthritis   . Coronary artery disease   . Cough 06/18/2009  . DEPRESSION 11/28/2006  . GERD 12/10/2007  . History of kidney stones   . HYPERTENSION 11/28/2006  . Mitral regurgitation   . MURMUR 11/28/2006  . PEPTIC ULCER DISEASE 12/10/2007  . 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 S/P mitral valve repair 04/10/2018   Complex valvuloplasty including artificial Gore-tex neochord placement x8 and Sorin Memo 4D ring annuloplasty, size 28     Past Surgical History:  Procedure Laterality Date  . ABDOMINAL HYSTERECTOMY    . CARDIAC CATHETERIZATION    . CHOLECYSTECTOMY    . CORONARY ARTERY BYPASS GRAFT N/A 04/10/2018   Procedure: CORONARY ARTERY BYPASS GRAFTING (CABG), ON PUMP, TIMES TWO, LIMA TO LAD AND SVG TO RCA USING LEFT INTERNAL MAMMARY ARTERY AND RIGHT GREATER SAPHENOUS VEIN HARVESTED ENDOSCOPICALLY;  Surgeon: Rexene Alberts, MD;  Location: [place];  Service: Open Heart Surgery;  Laterality: N/A;  . MITRAL VALVE REPAIR N/A 04/10/2018   Procedure: MITRAL VALVE REPAIR (MVR) USING 28 MEMO 4D;  Surgeon: Rexene Alberts, MD;  Location: [place];  Service: Open Heart Surgery;  Laterality: N/A;  . RIGHT/LEFT HEART CATH AND CORONARY ANGIOGRAPHY N/A 02/06/2018   Procedure: RIGHT/LEFT  HEART CATH AND CORONARY ANGIOGRAPHY;  Surgeon: Nigel Mormon, MD;  Location: [place] [facility];  Service: Cardiovascular;  Laterality: N/A;  . s/p multiple breast biopsy     negative  . TEE WITHOUT CARDIOVERSION N/A 02/06/2018   Procedure: TRANSESOPHAGEAL ECHOCARDIOGRAM (TEE);  Surgeon: Nigel Mormon, MD;  Location: [facility] ENDOSCOPY;  Service: Cardiovascular;  Laterality: N/A;  . TEE WITHOUT CARDIOVERSION N/A 04/10/2018   Procedure: TRANSESOPHAGEAL ECHOCARDIOGRAM (TEE);  Surgeon: Rexene Alberts, MD;  Location: [place];  Service: Open Heart Surgery;  Laterality: N/A;     Social History   Socioeconomic History  . Marital status: Single    Spouse name: Not on file  . Number of children: 0  . Years of education: Not on file  . Highest education level: High school graduate  Occupational History  . Not on file  Social Needs  . Financial resource strain: Not hard at all  . Food insecurity    Worry: Never true    Inability: Never true  . Transportation needs    Medical: No    Non-medical: No  Tobacco Use  . Smoking status: Never Smoker  . Smokeless tobacco: Never Used  Substance and Sexual Activity  . Alcohol use: No  . Drug use: Never  . Sexual activity: Not on file  Lifestyle  . Physical activity    Days per week: 0 days    Minutes per session:  0 min  . Stress: Only a little  Relationships  . Social Herbalist on phone: Not on file    Gets together: Not on file    Attends religious service: Not on file    Active member of club or organization: Not on file    Attends meetings of clubs or organizations: Not on file    Relationship status: Not on file  . Intimate partner violence    Fear of current or ex partner: Not on file    Emotionally abused: Not on file    Physically abused: Not on file    Forced sexual activity: Not on file  Other Topics Concern  . Not on file  Social History Narrative  . Not on file     Family History  Problem Relation  Age of Onset  . Transient ischemic attack Other   . Hypertension Other      Current Outpatient Medications on File Prior to Visit  Medication Sig Dispense Refill  . aspirin 81 MG EC tablet Take 81 mg by mouth daily.      Marland Kitchen atorvastatin (LIPITOR) 80 MG tablet TAKE 1 TABLET BY MOUTH DAILY AT 6 PM 90 tablet 3  . furosemide (LASIX) 20 MG tablet Take 1 tablet (20 mg total) by mouth daily as needed. 30 tablet 2  . metoprolol succinate (TOPROL XL) 25 MG 24 hr tablet Take 1 tablet (25 mg total) by mouth daily. 30 tablet 3  . sacubitril-valsartan (ENTRESTO) 24-26 MG Take 1 tablet by mouth 2 (two) times daily. 60 tablet 1  . amoxicillin (AMOXIL) 500 MG capsule Take 2 capsules (1,000 mg total) by mouth 2 (two) times daily. (Patient not taking: Reported on 01/31/2019) 40 capsule 0   Current Facility-Administered Medications on File Prior to Visit  Medication Dose Route Frequency Provider Last Rate Last Dose  . sacubitril-valsartan (ENTRESTO) 24-26 mg per tablet  1 tablet Oral BID Annmargaret Decaprio, Reynold Bowen, MD        Cardiovascular studies:  EKG 12/10/2018: Sinus rhythm 73 bpm. Anterolateral T wave inversions. Consider ischemia.   Echocardiogram 12/01/2018:   Left ventricle cavity is normal in size.Moderately depressed LV systolic function with visual EF 35-40%. Mildly hypokinetic global wall motion. Abnormal septal wall motion due to post-operative valve. Mild concentric hypertrophy of the left ventricle. Indeterminate diastolic filling pattern due to MV repair.  Calculated EF 55%. Left atrial cavity is moderately dilated at 4.7 cm. There is MV ring noted in good position. Mild mitral valve leaflet thickening.  Mildly restricted mitral valve leaflets. Mild mitral valve stenosis. Mitral valve peak pressure gradient of 11.7 and mean gradient of 3.9 mmHg, calculated mitral valve area 1.8 cm. Mild to moderate mitral regurgitation. Mild tricuspid regurgitation. No evidence of pulmonary hypertension. Compared  to the study done on 12/25/2017, LVEF previously reported to be 55%.  Mitral valve repair is new.  TEE 04/10/2018:  Left atrium: Cavity is mildly dilated.  Aortic valve: No stenosis. Trace regurgitation.  Mitral valve: Leaflets appear myxomatous, dilated mitral annulus and repaired mitral valve with annuloplasty ring. After CPB, mitral valve was well visualized. Annular ring appeared well seated. No evidence of regurgitation. Mean gradient of 1 was measured across the valve. Severe regurgitation. There is prolapse of the middle scallop of the posterior mitral leaflet.  Right ventricle: Normal cavity size, wall thickness and ejection fraction.  Tricuspid valve: Trace regurgitation.  Procedure 04/10/2018:  (Dr. Roxy Manns)   Mitral Valve Repair  Complex valvuloplasty including artificial Gore-tex neochord placement x8             Sorin Memo 4D ring annuloplasty (size 69mm, catalog #[id number], serial [id number])   Coronary Artery Bypass Grafting x 2              Left Internal Mammary Artery to Distal Left Anterior Descending Coronary Artery             Saphenous Vein Graft to Posterior Descending Coronary Artery             Endoscopic Vein Harvest from Right Thigh   Carotid US 04/06/2018: Right Carotid: Velocities in the right ICA are consistent with a 1-39% stenosis.  Left Carotid: Velocities in the left ICA are consistent with a 1-39% stenosis. Vertebrals: Left vertebral artery demonstrates antegrade flow. Right vertebral             artery demonstrates bidirectional flow.  Recent labs: Results for CHARLES, SHARER (MRN [id number]) as of 07/27/2018 11:04  Ref. Range 04/27/2018 [id number]  BASIC METABOLIC PANEL Unknown Rpt (A)  Sodium Latest Ref Range: 134 - 144 mmol/L 137  Potassium Latest Ref Range: 3.5 - 5.2 mmol/L 4.1  Chloride Latest Ref Range: 96 - 106 mmol/L 100  CO2 Latest Ref Range: 20 - 29 mmol/L 22  Glucose Latest Ref Range: 65 - 99 mg/dL 124 (H)  BUN Latest Ref Range: 8 -  27 mg/dL 8  Creatinine Latest Ref Range: 0.57 - 1.00 mg/dL 0.69  Calcium Latest Ref Range: 8.7 - 10.3 mg/dL 8.9  BUN/Creatinine Ratio Latest Ref Range: 12 - 28  12  GFR, Est Non African American Latest Ref Range: >59 mL/min/1.73 89  GFR, Est African American Latest Ref Range: >59 mL/min/1.73 103   Results for OLEA, ARAGON (MRN [id number]) as of 07/27/2018 11:04  Ref. Range 04/27/2018 11:51  WBC Latest Ref Range: 3.4 - 10.8 x10E3/uL 6.8  RBC Latest Ref Range: 3.77 - 5.28 x10E6/uL 3.27 (L)  Hemoglobin Latest Ref Range: 11.1 - 15.9 g/dL 9.6 (L)  HCT Latest Ref Range: 34.0 - 46.6 % 28.6 (L)  MCV Latest Ref Range: 79 - 97 fL 88  MCH Latest Ref Range: 26.6 - 33.0 pg 29.4  MCHC Latest Ref Range: 31.5 - 35.7 g/dL 33.6  RDW Latest Ref Range: 11.7 - 15.4 % 13.4  Platelets Latest Ref Range: 150 - 450 x10E3/uL 277    Review of Systems  Constitution: Negative for decreased appetite, malaise/fatigue, weight gain and weight loss.  HENT: Negative for congestion.   Eyes: Negative for visual disturbance.  Cardiovascular: Negative for chest pain, dyspnea on exertion, leg swelling, palpitations and syncope.  Respiratory: Negative for shortness of breath.   Endocrine: Negative for cold intolerance.  Hematologic/Lymphatic: Does not bruise/bleed easily.  Skin: Negative for itching and rash.  Musculoskeletal: Negative for myalgias.  Gastrointestinal: Negative for abdominal pain, nausea and vomiting.  Genitourinary: Negative for dysuria.  Neurological: Negative for dizziness and weakness.  Psychiatric/Behavioral: The patient is not nervous/anxious.   All other systems reviewed and are negative.        Vitals:   01/31/19 1039  BP: 126/80  Pulse: 71  Temp: 97.9 F (36.6 C)  SpO2: 99%    Objective:   Physical Exam  Constitutional: She is oriented to person, place, and time. She appears well-developed and well-nourished. No distress.  HENT:  Head: Normocephalic and atraumatic.  Eyes: Pupils  are equal, round, and reactive to light. Conjunctivae are normal.  Neck: No JVD present.  Cardiovascular: Normal rate, regular rhythm and intact distal pulses.  No murmur heard. Pulmonary/Chest: Effort normal and breath sounds normal. She has no wheezes. She has no rales.  Abdominal: Soft. Bowel sounds are normal. There is no rebound.  Musculoskeletal:        General: No edema.  Lymphadenopathy:    She has no cervical adenopathy.  Neurological: She is alert and oriented to person, place, and time. No cranial nerve deficit.  Skin: Skin is warm and dry.  Psychiatric: She has a normal mood and affect.  Nursing note and vitals reviewed.         Assessment & Recommendations:   67 y/o [place] American female with mitral valve prolapse, CAD, now s/p mitral valve repair, CABGX2 (03/2018 by Dr. Roxy Manns), hypertension, mild HFrEF  HFrEF: EF 35-40%. NYHA class II symptoms. Stop amlodipine and metoprolol tartarate. Currently on Entresto 24-26 mg bid. Metoprolol succinate 25 mg daily. Increase to 2 tab twice daily of Entresto 24-26 mg bid. BMP in 1 week. If tolerates well, will send prescription for Entresto 49-51 mg bid.  I do not think her ocular complaints are related to her cardiac medications. I have encouraged her to talk to her ophthalmologist.   S/P mitral valve repair: On Aspirin 81 mg daily.  Essential hypertension: Controlled.  Coronary artery disease involving coronary bypass graft of native heart without angina pectoris: No angina symptoms. Continue aspirin 81 mg daily, lipitor 80 mg daily.    Nigel Mormon, MD [facility] Pager: [phone] Office: [phone] If no answer Cell [phone]

## 2019-01-31 NOTE — Patient Instructions (Addendum)
Take Entresto 24-26 mg 2 tab twice daily. Call us or send me a MyChart message in 1-2 weeks BMP in 1 week Entresto uptitration visit in 3 weeks.

## 2019-02-01 NOTE — Telephone Encounter (Signed)
Please read

## 2019-02-05 DIAGNOSIS — I5022 Chronic systolic (congestive) heart failure: Secondary | ICD-10-CM | POA: Diagnosis not present

## 2019-02-06 LAB — BASIC METABOLIC PANEL
BUN/Creatinine Ratio: 14 (ref 12–28)
BUN: 9 mg/dL (ref 8–27)
CO2: 24 mmol/L (ref 20–29)
Calcium: 8.8 mg/dL (ref 8.7–10.3)
Chloride: 104 mmol/L (ref 96–106)
Creatinine, Ser: 0.66 mg/dL (ref 0.57–1.00)
GFR calc Af Amer: 104 mL/min/{1.73_m2} (ref >59)
GFR calc non Af Amer: 90 mL/min/{1.73_m2} (ref >59)
Glucose: 84 mg/dL (ref 65–99)
Potassium: 4.2 mmol/L (ref 3.5–5.2)
Sodium: 140 mmol/L (ref 134–144)

## 2019-02-12 ENCOUNTER — Other Ambulatory Visit: Payer: Self-pay

## 2019-02-12 DIAGNOSIS — I25810 Atherosclerosis of coronary artery bypass graft(s) without angina pectoris: Secondary | ICD-10-CM

## 2019-02-12 MED ORDER — ENTRESTO 24-26 MG PO TABS: 1.0000 | ORAL_TABLET | Freq: Two times a day (BID) | ORAL | 1 refills | Status: DC

## 2019-02-14 DIAGNOSIS — I5022 Chronic systolic (congestive) heart failure: Secondary | ICD-10-CM

## 2019-02-14 NOTE — Telephone Encounter (Signed)
Please review  Thank you

## 2019-02-15 ENCOUNTER — Telehealth: Payer: Self-pay

## 2019-02-15 MED ORDER — ENTRESTO 49-51 MG PO TABS: 1.0000 | ORAL_TABLET | Freq: Two times a day (BID) | ORAL | 3 refills | Status: DC

## 2019-02-15 NOTE — Telephone Encounter (Signed)
completed

## 2019-02-15 NOTE — Telephone Encounter (Signed)
Could not understand some of the data points. Please tell her that I reviewed bloodwork, looks good. I went ahead and sent Entresto 49-51 mg bid to [facility] on [address] to take it, unless lightheadedness develops.   Thanks MJP

## 2019-02-20 ENCOUNTER — Encounter: Payer: Medicare Other | Admitting: Cardiology

## 2019-02-20 ENCOUNTER — Other Ambulatory Visit: Payer: Self-pay

## 2019-02-20 VITALS — BP 147/80 | HR 67

## 2019-02-20 DIAGNOSIS — I10 Essential (primary) hypertension: Secondary | ICD-10-CM

## 2019-02-22 ENCOUNTER — Ambulatory Visit: Payer: Medicare Other

## 2019-02-25 ENCOUNTER — Encounter: Payer: Self-pay | Admitting: Internal Medicine

## 2019-02-25 DIAGNOSIS — H9203 Otalgia, bilateral: Secondary | ICD-10-CM

## 2019-02-25 DIAGNOSIS — R001 Bradycardia, unspecified: Secondary | ICD-10-CM

## 2019-02-25 NOTE — Telephone Encounter (Signed)
From pt

## 2019-02-26 ENCOUNTER — Other Ambulatory Visit (INDEPENDENT_AMBULATORY_CARE_PROVIDER_SITE_OTHER): Payer: Medicare Other

## 2019-02-26 ENCOUNTER — Other Ambulatory Visit: Payer: Self-pay

## 2019-02-26 ENCOUNTER — Ambulatory Visit (INDEPENDENT_AMBULATORY_CARE_PROVIDER_SITE_OTHER): Payer: Medicare Other | Admitting: Internal Medicine

## 2019-02-26 ENCOUNTER — Encounter: Payer: Self-pay | Admitting: Internal Medicine

## 2019-02-26 DIAGNOSIS — R739 Hyperglycemia, unspecified: Secondary | ICD-10-CM

## 2019-02-26 DIAGNOSIS — R001 Bradycardia, unspecified: Secondary | ICD-10-CM | POA: Diagnosis not present

## 2019-02-26 DIAGNOSIS — J309 Allergic rhinitis, unspecified: Secondary | ICD-10-CM | POA: Diagnosis not present

## 2019-02-26 DIAGNOSIS — I10 Essential (primary) hypertension: Secondary | ICD-10-CM | POA: Diagnosis not present

## 2019-02-26 DIAGNOSIS — I25810 Atherosclerosis of coronary artery bypass graft(s) without angina pectoris: Secondary | ICD-10-CM | POA: Diagnosis not present

## 2019-02-26 DIAGNOSIS — H6691 Otitis media, unspecified, right ear: Secondary | ICD-10-CM

## 2019-02-26 LAB — T4, FREE: Free T4: 0.99 ng/dL (ref 0.60–1.60)

## 2019-02-26 LAB — TSH: TSH: 0.64 u[IU]/mL (ref 0.35–4.50)

## 2019-02-26 MED ORDER — AMOXICILLIN 500 MG PO CAPS: 1000.0000 mg | ORAL_CAPSULE | Freq: Two times a day (BID) | ORAL | 0 refills | Status: DC

## 2019-02-26 MED ORDER — CETIRIZINE HCL 10 MG PO TABS: 10.0000 mg | ORAL_TABLET | Freq: Every day | ORAL | 11 refills | Status: AC

## 2019-02-26 MED ORDER — TRIAMCINOLONE ACETONIDE 55 MCG/ACT NA AERO: 2.0000 | INHALATION_SPRAY | Freq: Every day | NASAL | 12 refills | Status: DC

## 2019-02-26 NOTE — Addendum Note (Signed)
Addended by: Biagio Borg on: 02/26/2019 12:34 PM   Modules accepted: Orders

## 2019-02-26 NOTE — Patient Instructions (Signed)
.  Please take all new medication as prescribed - the antibiotic,and the zyrtec and nasacort for allergies  Please continue all other medications as before, and refills have been done if requested.  Please have the pharmacy call with any other refills you may need.  Please continue your efforts at being more active, low cholesterol diet, and weight control.  Please keep your appointments with your specialists as you may have planned

## 2019-02-26 NOTE — Progress Notes (Signed)
Subjective:    Patient ID: Tami Wood, female    DOB: Sep 25, 1950, 68 y.o.   MRN: [id number]  HPI  Here to f/u, c/o bilateral but right > left ear pain with wind like noises occasionally, with feeling warm, HA, fatigued and general weakness ongoing x 3 days, constant, moderate, nothing seems to make better or worse.  Does have several wks ongoing nasal allergy symptoms with clearish congestion, itch and sneezing, without fever, pain, ST, cough, swelling or wheezing.  Pt denies new neurological symptoms such as new headache, or facial or extremity weakness or numbness   Pt denies polydipsia, polyuria Past Medical History:  Diagnosis Date  . ALLERGIC RHINITIS 06/18/2009  . ANEMIA-IRON DEFICIENCY 11/28/2006  . ANXIETY 11/28/2006  . Arthritis   . Coronary artery disease   . Cough 06/18/2009  . DEPRESSION 11/28/2006  . GERD 12/10/2007  . History of kidney stones   . HYPERTENSION 11/28/2006  . Mitral regurgitation   . MURMUR 11/28/2006  . PEPTIC ULCER DISEASE 12/10/2007  . 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 S/P mitral valve repair 04/10/2018   Complex valvuloplasty including artificial Gore-tex neochord placement x8 and Sorin Memo 4D ring annuloplasty, size 28   Past Surgical History:  Procedure Laterality Date  . ABDOMINAL HYSTERECTOMY    . CARDIAC CATHETERIZATION    . CHOLECYSTECTOMY    . CORONARY ARTERY BYPASS GRAFT N/A 04/10/2018   Procedure: CORONARY ARTERY BYPASS GRAFTING (CABG), ON PUMP, TIMES TWO, LIMA TO LAD AND SVG TO RCA USING LEFT INTERNAL MAMMARY ARTERY AND RIGHT GREATER SAPHENOUS VEIN HARVESTED ENDOSCOPICALLY;  Surgeon: Rexene Alberts, MD;  Location: [place];  Service: Open Heart Surgery;  Laterality: N/A;  . MITRAL VALVE REPAIR N/A 04/10/2018   Procedure: MITRAL VALVE REPAIR (MVR) USING 28 MEMO 4D;  Surgeon: Rexene Alberts, MD;  Location: [place];  Service: Open Heart Surgery;  Laterality: N/A;  . RIGHT/LEFT HEART CATH AND CORONARY ANGIOGRAPHY N/A  02/06/2018   Procedure: RIGHT/LEFT HEART CATH AND CORONARY ANGIOGRAPHY;  Surgeon: Nigel Mormon, MD;  Location: [place] [facility];  Service: Cardiovascular;  Laterality: N/A;  . s/p multiple breast biopsy     negative  . TEE WITHOUT CARDIOVERSION N/A 02/06/2018   Procedure: TRANSESOPHAGEAL ECHOCARDIOGRAM (TEE);  Surgeon: Nigel Mormon, MD;  Location[facility] ENDOSCOPY;  Service: Cardiovascular;  Laterality: N/A;  . TEE WITHOUT CARDIOVERSION N/A 04/10/2018   Procedure: TRANSESOPHAGEAL ECHOCARDIOGRAM (TEE);  Surgeon: Rexene Alberts, MD;  Location: [place];  Service: Open Heart Surgery;  Laterality: N/A;    reports that she has never smoked. She has never used smokeless tobacco. She reports that she does not drink alcohol or use drugs. family history includes Heart disease in her father; Heart failure in her father; Hypertension in an other family member; Transient ischemic attack in an other family member. No Known Allergies Current Outpatient Medications on File Prior to Visit  Medication Sig Dispense Refill  . aspirin 81 MG EC tablet Take 81 mg by mouth daily.      Marland Kitchen atorvastatin (LIPITOR) 80 MG tablet TAKE 1 TABLET BY MOUTH DAILY AT 6 PM 90 tablet 3  . furosemide (LASIX) 20 MG tablet Take 1 tablet (20 mg total) by mouth daily as needed. 30 tablet 2  . metoprolol succinate (TOPROL XL) 25 MG 24 hr tablet Take 1 tablet (25 mg total) by mouth daily. 30 tablet 3  . sacubitril-valsartan (ENTRESTO)  49-51 MG Take 1 tablet by mouth 2 (two) times daily. 60 tablet 3   No current facility-administered medications on file prior to visit.    Review of Systems  Constitutional: Negative for other unusual diaphoresis or sweats HENT: Negative for ear discharge or swelling Eyes: Negative for other worsening visual disturbances Respiratory: Negative for stridor or other swelling  Gastrointestinal: Negative for worsening distension or other blood Genitourinary: Negative for retention or other  urinary change Musculoskeletal: Negative for other MSK pain or swelling Skin: Negative for color change or other new lesions Neurological: Negative for worsening tremors and other numbness  Psychiatric/Behavioral: Negative for worsening agitation or other fatigue All otherwise neg per pt     Objective:   Physical Exam BP 140/82 (BP Location: Left Arm, Patient Position: Sitting, Cuff Size: Normal)   Pulse 72   Temp 98.7 F (37.1 C) (Oral)   Ht 5\' 5"  (1.651 m)   Wt 175 lb (79.4 kg)   SpO2 96%   BMI 29.12 kg/m  VS noted,  Constitutional: Pt appears in NAD HENT: Head: NCAT.  Right Ear: External ear normal.  Left Ear: External ear normal. , bilateral TMs red right > left with effusions Bilat tm's with mild erythema.  Max sinus areas non tender.  Pharynx with mild erythema, no exudate Eyes: . Pupils are equal, round, and reactive to light. Conjunctivae and EOM are normal Nose: without d/c or deformity Neck: Neck supple. Gross normal ROM Cardiovascular: Normal rate and regular rhythm.   Pulmonary/Chest: Effort normal and breath sounds without rales or wheezing.  Neurological: Pt is alert. At baseline orientation, motor grossly intact Skin: Skin is warm. No rashes, other new lesions, no LE edema Psychiatric: Pt behavior is normal without agitation  All otherwise neg per pt Lab Results  Component Value Date   WBC 3.8 11/16/2018   HGB 11.7 11/16/2018   HCT 35.7 11/16/2018   PLT 171 11/16/2018   GLUCOSE 84 02/05/2019   CHOL 152 08/16/2017   TRIG 90.0 08/16/2017   HDL 51.30 08/16/2017   LDLCALC 83 08/16/2017   ALT 21 04/06/2018   AST 33 04/06/2018   NA 140 02/05/2019   K 4.2 02/05/2019   CL 104 02/05/2019   CREATININE 0.66 02/05/2019   BUN 9 02/05/2019   CO2 24 02/05/2019   TSH 0.64 02/26/2019   INR 2.4 08/06/2018   HGBA1C 5.2 04/06/2018      Assessment & Plan:

## 2019-03-03 ENCOUNTER — Encounter: Payer: Self-pay | Admitting: Internal Medicine

## 2019-03-03 DIAGNOSIS — H6691 Otitis media, unspecified, right ear: Secondary | ICD-10-CM | POA: Insufficient documentation

## 2019-03-03 NOTE — Assessment & Plan Note (Signed)
Mild to mod, for antibx course,  to f/u any worsening symptoms or concerns 

## 2019-03-03 NOTE — Assessment & Plan Note (Signed)
Mild to mod, for add zyrtec and nasaocort asd,,  to f/u any worsening symptoms or concerns

## 2019-03-03 NOTE — Assessment & Plan Note (Signed)
stable overall by history and exam, recent data reviewed with pt, and pt to continue medical treatment as before,  to f/u any worsening symptoms or concerns  

## 2019-03-14 ENCOUNTER — Ambulatory Visit (INDEPENDENT_AMBULATORY_CARE_PROVIDER_SITE_OTHER): Payer: Medicare Other | Admitting: Cardiology

## 2019-03-14 ENCOUNTER — Other Ambulatory Visit: Payer: Self-pay

## 2019-03-14 ENCOUNTER — Encounter: Payer: Self-pay | Admitting: Cardiology

## 2019-03-14 ENCOUNTER — Ambulatory Visit: Payer: Medicare Other

## 2019-03-14 VITALS — BP 138/80 | HR 81 | Ht 65.0 in | Wt 174.0 lb

## 2019-03-14 DIAGNOSIS — Z9889 Other specified postprocedural states: Secondary | ICD-10-CM

## 2019-03-14 DIAGNOSIS — I10 Essential (primary) hypertension: Secondary | ICD-10-CM

## 2019-03-14 DIAGNOSIS — R002 Palpitations: Secondary | ICD-10-CM

## 2019-03-14 DIAGNOSIS — I25810 Atherosclerosis of coronary artery bypass graft(s) without angina pectoris: Secondary | ICD-10-CM

## 2019-03-14 DIAGNOSIS — I5022 Chronic systolic (congestive) heart failure: Secondary | ICD-10-CM

## 2019-03-14 MED ORDER — METOPROLOL SUCCINATE ER 50 MG PO TB24: 50.0000 mg | ORAL_TABLET | Freq: Every day | ORAL | 3 refills | Status: DC

## 2019-03-14 NOTE — Progress Notes (Signed)
Follow up visit  Subjective:   Tami Wood, female    DOB: 08-21-1950, 68 y.o.   MRN: [id number]   Chief Complaint  Patient presents with  . hronic Heart Failure    s/p Mitral Valve Repair    HPI  68 y/o [place] American female with mitral valve prolapse, CAD, now s/p mitral valve repair, CABGX2 (03/2018 by Dr. Roxy Manns), hypertension, here for follow up.  She is feeling well and denies chest pain, shortness of breath, leg edema, orthopnea, PND, TIA/syncope. She has  Palpitations every night when she goes to bed, HR reaching in 130s. She denies any associated symptoms.    Past Medical History:  Diagnosis Date  . ALLERGIC RHINITIS 06/18/2009  . ANEMIA-IRON DEFICIENCY 11/28/2006  . ANXIETY 11/28/2006  . Arthritis   . Coronary artery disease   . Cough 06/18/2009  . DEPRESSION 11/28/2006  . GERD 12/10/2007  . History of kidney stones   . HYPERTENSION 11/28/2006  . Mitral regurgitation   . MURMUR 11/28/2006  . PEPTIC ULCER DISEASE 12/10/2007  . 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 S/P mitral valve repair 04/10/2018   Complex valvuloplasty including artificial Gore-tex neochord placement x8 and Sorin Memo 4D ring annuloplasty, size 28     Past Surgical History:  Procedure Laterality Date  . ABDOMINAL HYSTERECTOMY    . CARDIAC CATHETERIZATION    . CHOLECYSTECTOMY    . CORONARY ARTERY BYPASS GRAFT N/A 04/10/2018   Procedure: CORONARY ARTERY BYPASS GRAFTING (CABG), ON PUMP, TIMES TWO, LIMA TO LAD AND SVG TO RCA USING LEFT INTERNAL MAMMARY ARTERY AND RIGHT GREATER SAPHENOUS VEIN HARVESTED ENDOSCOPICALLY;  Surgeon: Rexene Alberts, MD;  Location: [facility];  Service: Open Heart Surgery;  Laterality: N/A;  . MITRAL VALVE REPAIR N/A 04/10/2018   Procedure: MITRAL VALVE REPAIR (MVR) USING 28 MEMO 4D;  Surgeon: Rexene Alberts, MD;  Location: [place];  Service: Open Heart Surgery;  Laterality: N/A;  . RIGHT/LEFT HEART CATH AND CORONARY ANGIOGRAPHY N/A 02/06/2018   Procedure: RIGHT/LEFT HEART CATH AND CORONARY ANGIOGRAPHY;  Surgeon: Nigel Mormon, MD;  Location: [facility];  Service: Cardiovascular;  Laterality: N/A;  . s/p multiple breast biopsy     negative  . TEE WITHOUT CARDIOVERSION N/A 02/06/2018   Procedure: TRANSESOPHAGEAL ECHOCARDIOGRAM (TEE);  Surgeon: Nigel Mormon, MD;  Location: [facility] ENDOSCOPY;  Service: Cardiovascular;  Laterality: N/A;  . TEE WITHOUT CARDIOVERSION N/A 04/10/2018   Procedure: TRANSESOPHAGEAL ECHOCARDIOGRAM (TEE);  Surgeon: Rexene Alberts, MD;  Location: [place];  Service: Open Heart Surgery;  Laterality: N/A;     Social History   Socioeconomic History  . Marital status: Single    Spouse name: Not on file  . Number of children: 0  . Years of education: Not on file  . Highest education level: High school graduate  Occupational History  . Not on file  Tobacco Use  . Smoking status: Never Smoker  . Smokeless tobacco: Never Used  Substance and Sexual Activity  . Alcohol use: No  . Drug use: Never  . Sexual activity: Not on file  Other Topics Concern  . Not on file  Social History Narrative  . Not on file   Social Determinants of Health   Financial Resource Strain: Low Risk   . Difficulty of Paying Living Expenses: Not hard at all  Food Insecurity: No Food Insecurity  . Worried About Charity fundraiser  in the Last Year: Never true  . Ran Out of Food in the Last Year: Never true  Transportation Needs: No Transportation Needs  . Lack of Transportation (Medical): No  . Lack of Transportation (Non-Medical): No  Physical Activity: Inactive  . Days of Exercise per Week: 0 days  . Minutes of Exercise per Session: 0 min  Stress: No Stress Concern Present  . Feeling of Stress : Only a little  Social Connections:   . Frequency of Communication with Friends and Family: Not on file  . Frequency of Social Gatherings with Friends and Family: Not on file  . Attends Religious Services: Not on file    . Active Member of Clubs or Organizations: Not on file  . Attends Archivist Meetings: Not on file  . Marital Status: Not on file  Intimate Partner Violence:   . Fear of Current or Ex-Partner: Not on file  . Emotionally Abused: Not on file  . Physically Abused: Not on file  . Sexually Abused: Not on file     Family History  Problem Relation Age of Onset  . Transient ischemic attack Other   . Hypertension Other   . Heart disease Father   . Heart failure Father      Current Outpatient Medications on File Prior to Visit  Medication Sig Dispense Refill  . amoxicillin (AMOXIL) 500 MG capsule Take 2 capsules (1,000 mg total) by mouth 2 (two) times daily. (Patient taking differently: Take 1,000 mg by mouth daily. ) 40 capsule 0  . aspirin 81 MG EC tablet Take 81 mg by mouth daily.      Marland Kitchen atorvastatin (LIPITOR) 80 MG tablet TAKE 1 TABLET BY MOUTH DAILY AT 6 PM 90 tablet 3  . cetirizine (ZYRTEC) 10 MG tablet Take 1 tablet (10 mg total) by mouth daily. 30 tablet 11  . metoprolol succinate (TOPROL XL) 25 MG 24 hr tablet Take 1 tablet (25 mg total) by mouth daily. 30 tablet 3  . sacubitril-valsartan (ENTRESTO) 49-51 MG Take 1 tablet by mouth 2 (two) times daily. 60 tablet 3  . sodium chloride (OCEAN) 0.65 % SOLN nasal spray Place 1 spray into both nostrils as needed for congestion.    . furosemide (LASIX) 20 MG tablet Take 1 tablet (20 mg total) by mouth daily as needed. (Patient not taking: Reported on 03/14/2019) 30 tablet 2  . triamcinolone (NASACORT) 55 MCG/ACT AERO nasal inhaler Place 2 sprays into the nose daily. (Patient not taking: Reported on 03/14/2019) 1 Inhaler 12   No current facility-administered medications on file prior to visit.    Cardiovascular studies:  EKG 12/10/2018: Sinus rhythm 73 bpm. Anterolateral T wave inversions. Consider ischemia.   Echocardiogram 12/01/2018:   Left ventricle cavity is normal in size.Moderately depressed LV systolic function with  visual EF 35-40%. Mildly hypokinetic global wall motion. Abnormal septal wall motion due to post-operative valve. Mild concentric hypertrophy of the left ventricle. Indeterminate diastolic filling pattern due to MV repair.  Calculated EF 55%. Left atrial cavity is moderately dilated at 4.7 cm. There is MV ring noted in good position. Mild mitral valve leaflet thickening.  Mildly restricted mitral valve leaflets. Mild mitral valve stenosis. Mitral valve peak pressure gradient of 11.7 and mean gradient of 3.9 mmHg, calculated mitral valve area 1.8 cm. Mild to moderate mitral regurgitation. Mild tricuspid regurgitation. No evidence of pulmonary hypertension. Compared to the study done on 12/25/2017, LVEF previously reported to be 55%.  Mitral valve repair is new.  TEE 04/10/2018:  Left atrium: Cavity is mildly dilated.  Aortic valve: No stenosis. Trace regurgitation.  Mitral valve: Leaflets appear myxomatous, dilated mitral annulus and repaired mitral valve with annuloplasty ring. After CPB, mitral valve was well visualized. Annular ring appeared well seated. No evidence of regurgitation. Mean gradient of 1 was measured across the valve. Severe regurgitation. There is prolapse of the middle scallop of the posterior mitral leaflet.  Right ventricle: Normal cavity size, wall thickness and ejection fraction.  Tricuspid valve: Trace regurgitation.  Procedure 04/10/2018:  (Dr. Roxy Manns)   Mitral Valve Repair             Complex valvuloplasty including artificial Gore-tex neochord placement x8             Sorin Memo 4D ring annuloplasty (size 42m, catalog #[id number], serial ##[id number]   Coronary Artery Bypass Grafting x 2              Left Internal Mammary Artery to Distal Left Anterior Descending Coronary Artery             Saphenous Vein Graft to Posterior Descending Coronary Artery             Endoscopic Vein Harvest from Right Thigh   Carotid [place]/12/2018: Right Carotid: Velocities in the  right ICA are consistent with a 1-39% stenosis.  Left Carotid: Velocities in the left ICA are consistent with a 1-39% stenosis. Vertebrals: Left vertebral artery demonstrates antegrade flow. Right vertebral             artery demonstrates bidirectional flow.  Recent labs: 02/05/2019: Glucose 84, BUN/Cr 9/0.66. EGFR 104. Na/K 140/4.2. Rest of the CMP normal H/H 11.7/35.7. MCV 91. Platelets 171 (10/2018) Chol 152, TG 90, HDL 51, LDL 83 (07/2017) TSH normal (03/2018)   Review of Systems  Constitution: Negative for decreased appetite, malaise/fatigue, weight gain and weight loss.  HENT: Negative for congestion.   Eyes: Negative for visual disturbance.  Cardiovascular: Negative for chest pain, dyspnea on exertion, leg swelling, palpitations and syncope.  Respiratory: Negative for shortness of breath.   Endocrine: Negative for cold intolerance.  Hematologic/Lymphatic: Does not bruise/bleed easily.  Skin: Negative for itching and rash.  Musculoskeletal: Negative for myalgias.  Gastrointestinal: Negative for abdominal pain, nausea and vomiting.  Genitourinary: Negative for dysuria.  Neurological: Negative for dizziness and weakness.  Psychiatric/Behavioral: The patient is not nervous/anxious.   All other systems reviewed and are negative.        Vitals:   03/14/19 1329  BP: 138/80  Pulse: 81  SpO2: 98%    Objective:   Physical Exam  Constitutional: She is oriented to person, place, and time. She appears well-developed and well-nourished. No distress.  HENT:  Head: Normocephalic and atraumatic.  Eyes: Pupils are equal, round, and reactive to light. Conjunctivae are normal.  Neck: No JVD present.  Cardiovascular: Normal rate, regular rhythm and intact distal pulses.  No murmur heard. Pulmonary/Chest: Effort normal and breath sounds normal. She has no wheezes. She has no rales.  Abdominal: Soft. Bowel sounds are normal. There is no rebound.  Musculoskeletal:        General: No  edema.  Lymphadenopathy:    She has no cervical adenopathy.  Neurological: She is alert and oriented to person, place, and time. No cranial nerve deficit.  Skin: Skin is warm and dry.  Psychiatric: She has a normal mood and affect.  Nursing note and vitals reviewed.         Assessment & Recommendations:  68 y/o [place] female with mitral valve prolapse, CAD, now s/p mitral valve repair, CABGX2 (03/2018 by Dr. Roxy Manns), hypertension, mild HFrEF  HFrEF: Clinically euvolumic. EF 35-40%. NYHA class II symptoms. Increase metoprolol succinate to 50 mg daily. Continue Entresto 49-51 mg bid.  Labs reviewed with the patient.  Palpitations: 48 hr Holter rmonitor  S/P mitral valve repair: On Aspirin 81 mg daily.  Essential hypertension: Controlled.  Coronary artery disease involving coronary bypass graft of native heart without angina pectoris: No angina symptoms. Continue aspirin 81 mg daily, lipitor 80 mg daily.   F/u in 4 weeks. Will up titrate Entresto and check BMP and lipid panel 2 weeks after that. Echocardiogram in 05/2019.  Nigel Mormon, MD [facility] Pager: [phone] Office: [phone] If no answer Cell [phone]

## 2019-03-18 DIAGNOSIS — R002 Palpitations: Secondary | ICD-10-CM | POA: Diagnosis not present

## 2019-04-10 ENCOUNTER — Other Ambulatory Visit: Payer: Self-pay

## 2019-04-10 ENCOUNTER — Telehealth (INDEPENDENT_AMBULATORY_CARE_PROVIDER_SITE_OTHER): Payer: Medicare Other | Admitting: Cardiology

## 2019-04-10 DIAGNOSIS — Z9889 Other specified postprocedural states: Secondary | ICD-10-CM

## 2019-04-10 DIAGNOSIS — I5022 Chronic systolic (congestive) heart failure: Secondary | ICD-10-CM | POA: Diagnosis not present

## 2019-04-10 DIAGNOSIS — I10 Essential (primary) hypertension: Secondary | ICD-10-CM | POA: Diagnosis not present

## 2019-04-10 DIAGNOSIS — I25810 Atherosclerosis of coronary artery bypass graft(s) without angina pectoris: Secondary | ICD-10-CM | POA: Diagnosis not present

## 2019-04-10 NOTE — Progress Notes (Signed)
Follow up visit  Subjective:   Tami Wood, female    DOB: January 18, 1951, 69 y.o.   MRN: [id number]   I connected with the patient on 04/10/2019 by a telephone call and verified that I am speaking with the correct person using two identifiers.     I offered the patient a video enabled application for a virtual visit. Unfortunately, this could not be accomplished due to technical difficulties/lack of video enabled phone/computer. I discussed the limitations of evaluation and management by telemedicine and the availability of in person appointments. The patient expressed understanding and agreed to proceed.   This visit type was conducted due to national recommendations for restrictions regarding the COVID-19 Pandemic (e.g. social distancing).  This format is felt to be most appropriate for this patient at this time.  All issues noted in this document were discussed and addressed.  No physical exam was performed (except for noted visual exam findings with Tele health visits).  The patient has consented to conduct a Tele health visit and understands insurance will be billed.    Chief Complaint  Patient presents with  . Cardiomyopathy    HPI  69 y/o [place] American female with mitral valve prolapse, CAD, now s/p mitral valve repair, CABGX2 (03/2018 by Dr. Roxy Manns), hypertension, here for follow up.  As patient has reported episodes of tachycardia on a daily basis, I checked 48 hr Holter monitor. It did not show any tachyarrhythmia. Patient "hears" her heartbeat when she goes to be bed, but denies any heart racing sensation. She has only occasional leg edema, requiring her to take lasix. Denies any exertional dyspnea.    Current Outpatient Medications on File Prior to Visit  Medication Sig Dispense Refill  . amoxicillin (AMOXIL) 500 MG capsule Take 2 capsules (1,000 mg total) by mouth 2 (two) times daily. (Patient taking differently: Take 1,000 mg by mouth daily. ) 40 capsule 0  . aspirin 81 MG  EC tablet Take 81 mg by mouth daily.      Marland Kitchen atorvastatin (LIPITOR) 80 MG tablet TAKE 1 TABLET BY MOUTH DAILY AT 6 PM 90 tablet 3  . cetirizine (ZYRTEC) 10 MG tablet Take 1 tablet (10 mg total) by mouth daily. 30 tablet 11  . furosemide (LASIX) 20 MG tablet Take 1 tablet (20 mg total) by mouth daily as needed. (Patient not taking: Reported on 03/14/2019) 30 tablet 2  . metoprolol succinate (TOPROL XL) 50 MG 24 hr tablet Take 1 tablet (50 mg total) by mouth daily. 90 tablet 3  . sacubitril-valsartan (ENTRESTO) 49-51 MG Take 1 tablet by mouth 2 (two) times daily. 60 tablet 3  . sodium chloride (OCEAN) 0.65 % SOLN nasal spray Place 1 spray into both nostrils as needed for congestion.    . triamcinolone (NASACORT) 55 MCG/ACT AERO nasal inhaler Place 2 sprays into the nose daily. (Patient not taking: Reported on 03/14/2019) 1 Inhaler 12   No current facility-administered medications on file prior to visit.    Cardiovascular studies:  48 hr Holter monitor 03/15/2019:  Dominant rhythm sinus.  HR 45-98 bpm. Avg HR 68 bpm.  No atrial fibrillation/atrial flutter/SVT/VT/high grade AV block, sinus pause >3sec noted.  Rare PAC/PVC seen.  Symptoms reported: None    EKG 12/10/2018: Sinus rhythm 73 bpm. Anterolateral T wave inversions. Consider ischemia.   Echocardiogram 12/01/2018:   Left ventricle cavity is normal in size.Moderately depressed LV systolic function with visual EF 35-40%. Mildly hypokinetic global wall motion. Abnormal septal wall motion due to post-operative  valve. Mild concentric hypertrophy of the left ventricle. Indeterminate diastolic filling pattern due to MV repair.  Calculated EF 55%. Left atrial cavity is moderately dilated at 4.7 cm. There is MV ring noted in good position. Mild mitral valve leaflet thickening.  Mildly restricted mitral valve leaflets. Mild mitral valve stenosis. Mitral valve peak pressure gradient of 11.7 and mean gradient of 3.9 mmHg, calculated mitral valve  area 1.8 cm. Mild to moderate mitral regurgitation. Mild tricuspid regurgitation. No evidence of pulmonary hypertension. Compared to the study done on 12/25/2017, LVEF previously reported to be 55%.  Mitral valve repair is new.  TEE 04/10/2018:  Left atrium: Cavity is mildly dilated.  Aortic valve: No stenosis. Trace regurgitation.  Mitral valve: Leaflets appear myxomatous, dilated mitral annulus and repaired mitral valve with annuloplasty ring. After CPB, mitral valve was well visualized. Annular ring appeared well seated. No evidence of regurgitation. Mean gradient of 1 was measured across the valve. Severe regurgitation. There is prolapse of the middle scallop of the posterior mitral leaflet.  Right ventricle: Normal cavity size, wall thickness and ejection fraction.  Tricuspid valve: Trace regurgitation.  Procedure 04/10/2018:  (Dr. Roxy Manns)   Mitral Valve Repair             Complex valvuloplasty including artificial Gore-tex neochord placement x8             Sorin Memo 4D ring annuloplasty (size 19m, catalog #[id number], serial ##[id number]   Coronary Artery Bypass Grafting x 2              Left Internal Mammary Artery to Distal Left Anterior Descending Coronary Artery             Saphenous Vein Graft to Posterior Descending Coronary Artery             Endoscopic Vein Harvest from Right Thigh   Carotid [place]/12/2018: Right Carotid: Velocities in the right ICA are consistent with a 1-39% stenosis.  Left Carotid: Velocities in the left ICA are consistent with a 1-39% stenosis. Vertebrals: Left vertebral artery demonstrates antegrade flow. Right vertebral             artery demonstrates bidirectional flow.  Recent labs: 02/05/2019: Glucose 84, BUN/Cr 9/0.66. EGFR 104. Na/K 140/4.2. Rest of the CMP normal H/H 11.7/35.7. MCV 91. Platelets 171 (10/2018) Chol 152, TG 90, HDL 51, LDL 83 (07/2017) TSH normal (03/2018)   Review of Systems  Cardiovascular: Negative for chest pain,  dyspnea on exertion, leg swelling, palpitations and syncope.        No vitals available.  Objective:   Physical Exam  Not performed. Telephone visit.         Assessment & Recommendations:   69y/o [place] female with mitral valve prolapse, CAD, now s/p mitral valve repair, CABGX2 (03/2018 by Dr. ORoxy Manns, hypertension, mild HFrEF  HFrEF: Clinically euvolumic at last office visit in 02/2019. No change in symptoms since then.  EF 35-40%. NYHA class II symptoms. Continuee metoprolol succinate to 50 mg daily. Continue Entresto 49-51 mg bid.  Repeat echocardiogram in March 2021.  S/P mitral valve repair: On Aspirin 81 mg daily.  Essential hypertension: Controlled.  Coronary artery disease involving coronary bypass graft of native heart without angina pectoris: No angina symptoms. Continue aspirin 81 mg daily, lipitor 80 mg daily.   F/u in March after echocardiogram  MNigel Mormon MD [facility] Pager: [phone]: [phone] no answer Cell [phone]

## 2019-04-17 ENCOUNTER — Other Ambulatory Visit: Payer: Self-pay | Admitting: Cardiology

## 2019-04-17 DIAGNOSIS — I25810 Atherosclerosis of coronary artery bypass graft(s) without angina pectoris: Secondary | ICD-10-CM

## 2019-04-24 ENCOUNTER — Ambulatory Visit (INDEPENDENT_AMBULATORY_CARE_PROVIDER_SITE_OTHER): Payer: Medicare Other | Admitting: Otolaryngology

## 2019-04-24 ENCOUNTER — Other Ambulatory Visit: Payer: Self-pay

## 2019-04-24 ENCOUNTER — Encounter (INDEPENDENT_AMBULATORY_CARE_PROVIDER_SITE_OTHER): Payer: Self-pay | Admitting: Otolaryngology

## 2019-04-24 VITALS — Temp 97.9°F

## 2019-04-24 DIAGNOSIS — J310 Chronic rhinitis: Secondary | ICD-10-CM

## 2019-04-24 DIAGNOSIS — H903 Sensorineural hearing loss, bilateral: Secondary | ICD-10-CM | POA: Diagnosis not present

## 2019-04-24 DIAGNOSIS — I25810 Atherosclerosis of coronary artery bypass graft(s) without angina pectoris: Secondary | ICD-10-CM | POA: Diagnosis not present

## 2019-04-24 NOTE — Progress Notes (Signed)
HPI: Tami Wood is a 69 y.o. female who presents for evaluation of ear complaints as well as nasal congestion which is much worse at night when she lies down.  She states that her ears are sensitive on cold windy days she also describes occasional noise in her ears.  She complains of nasal congestion which is worse at night when she lies down.  No fever.  No yellow-green discharge from the nose.  Her last hearing test was over 5 years ago.  Past Medical History:  Diagnosis Date  . ALLERGIC RHINITIS 06/18/2009  . ANEMIA-IRON DEFICIENCY 11/28/2006  . ANXIETY 11/28/2006  . Arthritis   . Coronary artery disease   . Cough 06/18/2009  . DEPRESSION 11/28/2006  . GERD 12/10/2007  . History of kidney stones   . HYPERTENSION 11/28/2006  . Mitral regurgitation   . MURMUR 11/28/2006  . PEPTIC ULCER DISEASE 12/10/2007  . 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 S/P mitral valve repair 04/10/2018   Complex valvuloplasty including artificial Gore-tex neochord placement x8 and Sorin Memo 4D ring annuloplasty, size 28   Past Surgical History:  Procedure Laterality Date  . ABDOMINAL HYSTERECTOMY    . CARDIAC CATHETERIZATION    . CHOLECYSTECTOMY    . CORONARY ARTERY BYPASS GRAFT N/A 04/10/2018   Procedure: CORONARY ARTERY BYPASS GRAFTING (CABG), ON PUMP, TIMES TWO, LIMA TO LAD AND SVG TO RCA USING LEFT INTERNAL MAMMARY ARTERY AND RIGHT GREATER SAPHENOUS VEIN HARVESTED ENDOSCOPICALLY;  Surgeon: Rexene Alberts, MD;  Location: [place];  Service: Open Heart Surgery;  Laterality: N/A;  . MITRAL VALVE REPAIR N/A 04/10/2018   Procedure: MITRAL VALVE REPAIR (MVR) USING 28 MEMO 4D;  Surgeon: Rexene Alberts, MD;  Location: [place];  Service: Open Heart Surgery;  Laterality: N/A;  . RIGHT/LEFT HEART CATH AND CORONARY ANGIOGRAPHY N/A 02/06/2018   Procedure: RIGHT/LEFT HEART CATH AND CORONARY ANGIOGRAPHY;  Surgeon: Nigel Mormon, MD;  Location: [facility];  Service: Cardiovascular;   Laterality: N/A;  . s/p multiple breast biopsy     negative  . TEE WITHOUT CARDIOVERSION N/A 02/06/2018   Procedure: TRANSESOPHAGEAL ECHOCARDIOGRAM (TEE);  Surgeon: Nigel Mormon, MD;  Location: [facility] ENDOSCOPY;  Service: Cardiovascular;  Laterality: N/A;  . TEE WITHOUT CARDIOVERSION N/A 04/10/2018   Procedure: TRANSESOPHAGEAL ECHOCARDIOGRAM (TEE);  Surgeon: Rexene Alberts, MD;  Location: [place];  Service: Open Heart Surgery;  Laterality: N/A;   Social History   Socioeconomic History  . Marital status: Single    Spouse name: Not on file  . Number of children: 0  . Years of education: Not on file  . Highest education level: High school graduate  Occupational History  . Not on file  Tobacco Use  . Smoking status: Never Smoker  . Smokeless tobacco: Never Used  Substance and Sexual Activity  . Alcohol use: No  . Drug use: Never  . Sexual activity: Not on file  Other Topics Concern  . Not on file  Social History Narrative  . Not on file   Social Determinants of Health   Financial Resource Strain: Low Risk   . Difficulty of Paying Living Expenses: Not hard at all  Food Insecurity: No Food Insecurity  . Worried About Charity fundraiser in the Last Year: Never true  . Ran Out of Food in the Last Year: Never true  Transportation Needs: No Transportation Needs  . Lack of Transportation (  Medical): No  . Lack of Transportation (Non-Medical): No  Physical Activity: Inactive  . Days of Exercise per Week: 0 days  . Minutes of Exercise per Session: 0 min  Stress: No Stress Concern Present  . Feeling of Stress : Only a little  Social Connections:   . Frequency of Communication with Friends and Family: Not on file  . Frequency of Social Gatherings with Friends and Family: Not on file  . Attends Religious Services: Not on file  . Active Member of Clubs or Organizations: Not on file  . Attends Archivist Meetings: Not on file  . Marital Status: Not on file   Family  History  Problem Relation Age of Onset  . Transient ischemic attack Other   . Hypertension Other   . Heart disease Father   . Heart failure Father    No Known Allergies Prior to Admission medications   Medication Sig Start Date End Date Taking? Authorizing Provider  amoxicillin (AMOXIL) 500 MG capsule Take 2 capsules (1,000 mg total) by mouth 2 (two) times daily. Patient taking differently: Take 1,000 mg by mouth daily.  02/26/19  Yes Biagio Borg, MD  aspirin 81 MG EC tablet Take 81 mg by mouth daily.     Yes [provider]  atorvastatin (LIPITOR) 80 MG tablet TAKE 1 TABLET BY MOUTH DAILY AT 6 PM 11/02/18  Yes Patwardhan, Manish J, MD  cetirizine (ZYRTEC) 10 MG tablet Take 1 tablet (10 mg total) by mouth daily. 02/26/19 02/26/20 Yes Biagio Borg, MD  ENTRESTO 24-26 MG Take 1 tablet by mouth twice daily 04/17/19  Yes Patwardhan, Reynold Bowen, MD  furosemide (LASIX) 20 MG tablet Take 1 tablet (20 mg total) by mouth daily as needed. 08/14/18 08/14/19 Yes Patwardhan, Manish J, MD  metoprolol succinate (TOPROL XL) 50 MG 24 hr tablet Take 1 tablet (50 mg total) by mouth daily. 03/14/19  Yes Patwardhan, Manish J, MD  sacubitril-valsartan (ENTRESTO) 49-51 MG Take 1 tablet by mouth 2 (two) times daily. 02/15/19  Yes Patwardhan, Manish J, MD  sodium chloride (OCEAN) 0.65 % SOLN nasal spray Place 1 spray into both nostrils as needed for congestion.   Yes [provider]  triamcinolone (NASACORT) 55 MCG/ACT AERO nasal inhaler Place 2 sprays into the nose daily. 02/26/19  Yes Biagio Borg, MD     Positive ROS: Otherwise negative  All other systems have been reviewed and were otherwise negative with the exception of those mentioned in the HPI and as above.  Physical Exam: Constitutional: Alert, well-appearing, no acute distress Ears: External ears without lesions or tenderness. Ear canals are clear bilaterally with intact, clear TMs bilaterally.  No signs of infection.  On hearing screening  with a tuning forks she has diminished hearing in both ears with AC > BC bilaterally. Nasal: External nose without lesions. Septum with mild deformity.  Mild rhinitis bilaterally with clear mucus discharge.  Both middle meatus regions were clear with no signs of infection.  No polyps or lesions noted otherwise. Oral: Lips and gums without lesions. Tongue and palate mucosa without lesions. Posterior oropharynx clear. Neck: No palpable adenopathy or masses Respiratory: Breathing comfortably  Skin: No facial/neck lesions or rash noted.  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 We will schedule audiogram next week and have her follow-up following the audiogram.  Radene Journey, MD

## 2019-05-01 ENCOUNTER — Ambulatory Visit (INDEPENDENT_AMBULATORY_CARE_PROVIDER_SITE_OTHER): Payer: Medicare Other | Admitting: Otolaryngology

## 2019-05-02 ENCOUNTER — Encounter (INDEPENDENT_AMBULATORY_CARE_PROVIDER_SITE_OTHER): Payer: Self-pay

## 2019-05-13 ENCOUNTER — Encounter: Payer: Medicare Other | Admitting: Thoracic Surgery (Cardiothoracic Vascular Surgery)

## 2019-05-16 ENCOUNTER — Other Ambulatory Visit: Payer: Self-pay | Admitting: Cardiology

## 2019-05-16 DIAGNOSIS — I25810 Atherosclerosis of coronary artery bypass graft(s) without angina pectoris: Secondary | ICD-10-CM

## 2019-05-17 ENCOUNTER — Other Ambulatory Visit: Payer: Self-pay

## 2019-05-17 DIAGNOSIS — I5022 Chronic systolic (congestive) heart failure: Secondary | ICD-10-CM

## 2019-05-17 DIAGNOSIS — I25810 Atherosclerosis of coronary artery bypass graft(s) without angina pectoris: Secondary | ICD-10-CM

## 2019-05-17 MED ORDER — ENTRESTO 24-26 MG PO TABS: 1.0000 | ORAL_TABLET | Freq: Two times a day (BID) | ORAL | 0 refills | Status: DC

## 2019-05-20 ENCOUNTER — Other Ambulatory Visit: Payer: Self-pay

## 2019-05-20 ENCOUNTER — Telehealth (INDEPENDENT_AMBULATORY_CARE_PROVIDER_SITE_OTHER): Payer: Medicare Other | Admitting: Thoracic Surgery (Cardiothoracic Vascular Surgery)

## 2019-05-20 DIAGNOSIS — Z951 Presence of aortocoronary bypass graft: Secondary | ICD-10-CM | POA: Diagnosis not present

## 2019-05-20 DIAGNOSIS — Z9889 Other specified postprocedural states: Secondary | ICD-10-CM

## 2019-05-20 DIAGNOSIS — I2510 Atherosclerotic heart disease of native coronary artery without angina pectoris: Secondary | ICD-10-CM | POA: Diagnosis not present

## 2019-05-20 NOTE — Patient Instructions (Signed)

## 2019-05-20 NOTE — Progress Notes (Signed)
[address]       [place], [address]             [phone]     CARDIOTHORACIC SURGERY TELEPHONE VIRTUAL OFFICE NOTE  Referring Provider is Patwardhan, Reynold Bowen, MD Primary Cardiologist is No primary care provider on file. PCP is Biagio Borg, MD   HPI:  I spoke with Tami Wood (DOB [birth year] ) via telephone on 05/20/2019 at 10:22 AM and verified that I was speaking with the correct person using more than one form of identification.  We discussed the reason(s) for conducting our visit virtually instead of in-person.  The patient expressed understanding the circumstances and agreed to proceed as described.  Patient is a 69 year old African-American female who underwent mitral valve repair and coronary artery bypass grafting x2 on April 10, 2018 for mitral valve prolapse with severe symptomatic primary mitral regurgitation and multivessel coronary artery disease.   I spoke with her over the telephone today for routine follow-up now more than 1 year following her surgery.  She continues to follow-up regularly with Dr. Virgina Jock who had a telemedicine follow-up visit with her last month.  The patient is doing quite well.  She admits that she does not exercise on a regular basis but she reports no significant physical limitations.  She states that she only gets short of breath with moderate to strenuous exertion and that overall she can do what ever she wants to do physically.  She has reported some palpitations and recently had a 48-hour Holter monitor performed which revealed sinus rhythm with no tachy arrhythmias.  Most recent follow-up transthoracic echocardiogram performed Sep 2020 revealed intact mitral valve repair with no residual mitral regurgitation and mean transvalvular gradient across the mitral valve estimated only 3.9 mmHg.  Calculated ejection fraction was 55% although visually the ejection fraction was reported 35 to 40%.  The patient recently got the  first dose of the COVID-19 vaccine and overall is doing quite well.  She has no complaints.    Current Outpatient Medications  Medication Sig Dispense Refill  . amoxicillin (AMOXIL) 500 MG capsule Take 2 capsules (1,000 mg total) by mouth 2 (two) times daily. (Patient taking differently: Take 1,000 mg by mouth daily. ) 40 capsule 0  . aspirin 81 MG EC tablet Take 81 mg by mouth daily.      Marland Kitchen atorvastatin (LIPITOR) 80 MG tablet TAKE 1 TABLET BY MOUTH DAILY AT 6 PM 90 tablet 3  . cetirizine (ZYRTEC) 10 MG tablet Take 1 tablet (10 mg total) by mouth daily. 30 tablet 11  . ENTRESTO 24-26 MG Take 1 tablet by mouth twice daily 60 tablet 0  . furosemide (LASIX) 20 MG tablet Take 1 tablet (20 mg total) by mouth daily as needed. 30 tablet 2  . metoprolol succinate (TOPROL XL) 50 MG 24 hr tablet Take 1 tablet (50 mg total) by mouth daily. 90 tablet 3  . sacubitril-valsartan (ENTRESTO) 24-26 MG Take 1 tablet by mouth 2 (two) times daily. 60 tablet 0  . sacubitril-valsartan (ENTRESTO) 49-51 MG Take 1 tablet by mouth 2 (two) times daily. 60 tablet 3  . sodium chloride (OCEAN) 0.65 % SOLN nasal spray Place 1 spray into both nostrils as needed for congestion.    . triamcinolone (NASACORT) 55 MCG/ACT AERO nasal inhaler Place 2 sprays into the nose daily. 1 Inhaler 12   No current facility-administered medications for this visit.     Diagnostic Tests:  48  hr Holter monitor 03/15/2019:  Dominant rhythm sinus.  HR 45-98 bpm. Avg HR 68 bpm.  No atrial fibrillation/atrial flutter/SVT/VT/high grade AV block, sinus pause >3sec noted.  Rare PAC/PVC seen.  Symptoms reported: None    EKG 12/10/2018: Sinus rhythm 73 bpm. Anterolateral T wave inversions. Consider ischemia.   Echocardiogram 12/01/2018:  Left ventricle cavity is normal in size.Moderately depressed LV systolic function with visual EF 35-40%. Mildly hypokinetic global wall motion. Abnormal septal wall motion due to post-operative valve. Mild  concentric hypertrophy of the left ventricle. Indeterminate diastolic filling pattern due to MV repair. Calculated EF 55%. Left atrial cavity is moderately dilated at 4.7 cm. There is MV ring noted in good position. Mild mitral valve leaflet thickening. Mildly restricted mitral valve leaflets. Mild mitral valve stenosis. Mitral valve peak pressure gradient of 11.7 and mean gradient of 3.9 mmHg, calculated mitral valve area 1.8 cm. Mild to moderate mitral regurgitation. Mild tricuspid regurgitation. No evidence of pulmonary hypertension. Compared to the study done on 12/25/2017, LVEF previously reported to be 55%. Mitral valve repair is new.    Impression:  Patient is doing well more than 1 year status post mitral valve repair and coronary artery bypass grafting  Plan:  We have not recommended any change the patient's current medications.  We discussed the many benefits of a heart healthy diet and regular exercise.  The patient has been reminded regarding the importance of dental hygiene and the lifelong need for antibiotic prophylaxis for all dental cleanings and other related invasive procedures.  The patient will continue to follow-up regularly with Dr. Virgina Jock and return to our office in the future only should specific problems or questions arise.    I discussed limitations of evaluation and management via telephone.  The patient was advised to call back for repeat telephone consultation or to seek an in-person evaluation if questions arise or the patient's clinical condition changes in any significant manner.  I spent in excess of 5 minutes of non-face-to-face time during the conduct of this telephone virtual office consultation.     [name]. Roxy Manns, MD 05/20/2019 10:22 AM

## 2019-05-21 NOTE — Telephone Encounter (Signed)
Please give her samples of her current Entresto dose. Please check if she would need patient assistance.   Thanks MJP

## 2019-05-22 NOTE — Telephone Encounter (Signed)
Please read

## 2019-06-04 ENCOUNTER — Ambulatory Visit: Payer: Medicare Other

## 2019-06-04 ENCOUNTER — Other Ambulatory Visit: Payer: Self-pay

## 2019-06-04 DIAGNOSIS — I5022 Chronic systolic (congestive) heart failure: Secondary | ICD-10-CM

## 2019-06-10 ENCOUNTER — Encounter: Payer: Self-pay | Admitting: Cardiology

## 2019-06-10 ENCOUNTER — Ambulatory Visit: Payer: Medicare Other | Admitting: Cardiology

## 2019-06-10 ENCOUNTER — Other Ambulatory Visit: Payer: Self-pay

## 2019-06-10 VITALS — BP 160/87 | HR 63 | Temp 97.2°F | Ht 65.0 in | Wt 179.9 lb

## 2019-06-10 DIAGNOSIS — I10 Essential (primary) hypertension: Secondary | ICD-10-CM | POA: Diagnosis not present

## 2019-06-10 DIAGNOSIS — Z9889 Other specified postprocedural states: Secondary | ICD-10-CM | POA: Diagnosis not present

## 2019-06-10 DIAGNOSIS — I25810 Atherosclerosis of coronary artery bypass graft(s) without angina pectoris: Secondary | ICD-10-CM | POA: Diagnosis not present

## 2019-06-10 DIAGNOSIS — I5022 Chronic systolic (congestive) heart failure: Secondary | ICD-10-CM | POA: Diagnosis not present

## 2019-06-10 MED ORDER — ENTRESTO 49-51 MG PO TABS: 1.0000 | ORAL_TABLET | Freq: Two times a day (BID) | ORAL | 3 refills | Status: DC

## 2019-06-10 NOTE — Progress Notes (Signed)
Follow up visit  Subjective:   Tami Wood, female    DOB: [birth year], 69 y.o.   MRN: [id number]    Chief Complaint  Patient presents with  . Congestive Heart Failure  . Follow-up    3 month    HPI  69 y/o [place] American female with mitral valve prolapse, CAD, now s/p mitral valve repair, CABGX2 (03/2018 by Dr. Roxy Manns), hypertension.  She has noticed some swelling in her legs. Blood pressure is elevated.   Reviewed recent echocardiogram with the patient.   Current Outpatient Medications on File Prior to Visit  Medication Sig Dispense Refill  . aspirin 81 MG EC tablet Take 81 mg by mouth daily.      Marland Kitchen atorvastatin (LIPITOR) 80 MG tablet TAKE 1 TABLET BY MOUTH DAILY AT 6 PM 90 tablet 3  . furosemide (LASIX) 20 MG tablet Take 1 tablet (20 mg total) by mouth daily as needed. 30 tablet 2  . metoprolol succinate (TOPROL XL) 50 MG 24 hr tablet Take 1 tablet (50 mg total) by mouth daily. 90 tablet 3  . sacubitril-valsartan (ENTRESTO) 24-26 MG Take 1 tablet by mouth 2 (two) times daily. 60 tablet 0  . sodium chloride (OCEAN) 0.65 % SOLN nasal spray Place 1 spray into both nostrils as needed for congestion.    Marland Kitchen amoxicillin (AMOXIL) 500 MG capsule Take 2 capsules (1,000 mg total) by mouth 2 (two) times daily. (Patient not taking: Reported on 06/10/2019) 40 capsule 0  . cetirizine (ZYRTEC) 10 MG tablet Take 1 tablet (10 mg total) by mouth daily. 30 tablet 11  . ENTRESTO 24-26 MG Take 1 tablet by mouth twice daily 60 tablet 0  . sacubitril-valsartan (ENTRESTO) 49-51 MG Take 1 tablet by mouth 2 (two) times daily. 60 tablet 3  . triamcinolone (NASACORT) 55 MCG/ACT AERO nasal inhaler Place 2 sprays into the nose daily. 1 Inhaler 12   No current facility-administered medications on file prior to visit.    Cardiovascular studies:  06/10/2019: Sinus rhythm 64 bpm. Left atrial enlargement. Nonspecific T wave inversion lateral leads.   Echocardiogram 06/04/2019:  Mildly depressed LV  systolic function with visual EF 45-50%. Left  ventricle cavity is normal in size. Mild left ventricular hypertrophy.  Normal global wall motion. Unable to evaluate diastolic function due to  mitral valve repair. Calculated EF 43%.  Left atrial cavity is severely dilated. Interatrial septum bulges to the  right suggests elevated left atrial pressure.  Mitral valve ring is well seated and without evidence of dehiscence.  Posterior leaflet is restricted and anterior leaflet is thickened with  appropriate excursion. No evidence of mitral stenosis. At the heart rate  of 54bpm E wave velocity 1.587ms, TVI ratio 3.1, PHT 1290mc, Mean gradient  3.87m77m. Mild mitral regurgitation.  Mild tricuspid regurgitation. No evidence of pulmonary hypertension.  Prior study dated 12/01/2018 noted LVEF 35-40%, moderately dilated LA,  Mild mitral stenosis (PG 11.7mm26m MG 3.9mmH69mcalculated MVA 1.8cm2),  Mild TR.   48 hr Holter monitor 03/15/2019:  Dominant rhythm sinus.  HR 45-98 bpm. Avg HR 68 bpm.  No atrial fibrillation/atrial flutter/SVT/VT/high grade AV block, sinus pause >3sec noted.  Rare PAC/PVC seen.  Symptoms reported: None   EKG 12/10/2018: Sinus rhythm 73 bpm. Anterolateral T wave inversions. Consider ischemia.     Procedure 04/10/2018:  (Dr. Owen)Roxy Mannsitral Valve Repair             Complex valvuloplasty including artificial Gore-tex neochord placement x8  Sorin Memo 4D ring annuloplasty (size 47m, catalog #[id number], serial #[id number]   Coronary Artery Bypass Grafting x 2              Left Internal Mammary Artery to Distal Left Anterior Descending Coronary Artery             Saphenous Vein Graft to Posterior Descending Coronary Artery             Endoscopic Vein Harvest from Right Thigh   Carotid [place]/12/2018: Right Carotid: Velocities in the right ICA are consistent with a 1-39% stenosis.  Left Carotid: Velocities in the left ICA are consistent with a 1-39%  stenosis. Vertebrals: Left vertebral artery demonstrates antegrade flow. Right vertebral             artery demonstrates bidirectional flow.  Recent labs: 02/05/2019: Glucose 84, BUN/Cr 9/0.66. EGFR 104. Na/K 140/4.2. Rest of the CMP normal H/H 11.7/35.7. MCV 91. Platelets 171 (10/2018) Chol 152, TG 90, HDL 51, LDL 83 (07/2017) TSH normal (03/2018)   Review of Systems  Cardiovascular: Positive for leg swelling. Negative for chest pain, dyspnea on exertion, palpitations and syncope.       Vitals:   06/10/19 1433  BP: (!) 160/87  Pulse: 63  Temp: (!) 97.2 F (36.2 C)  SpO2: 100%     Objective:   Physical Exam  Constitutional: She appears well-developed and well-nourished.  Neck: No JVD present.  Cardiovascular: Normal rate, regular rhythm, normal heart sounds and intact distal pulses.  No murmur heard. Pulmonary/Chest: Effort normal and breath sounds normal. She has no wheezes. She has no rales.  Musculoskeletal:        General: Edema (Trace b/l) present.  Nursing note and vitals reviewed.          Assessment & Recommendations:   69y/o [place] female with mitral valve prolapse, CAD, now s/p mitral valve repair, CABGX2 (03/2018 by Dr. ORoxy Manns, hypertension, mild HFrEF  HFrEF: Clinically euvolumic. EF improved to 40-45%. Continue metoprolol succinate to 50 mg daily, increase Entresto to 49-51 mg bid.  Check BMP in 1-2 weeks.  S/P mitral valve repair: On Aspirin 81 mg daily.  Essential hypertension: Suboptimal control. Increased Entresto to 49-51 mg bid.   Coronary artery disease involving coronary bypass graft of native heart without angina pectoris: No angina symptoms. Continue aspirin 81 mg daily, lipitor 80 mg daily.   F/u in 3 months  MMineral MD [facility] Pager: [phone]: [phone] no answer Cell [phone]

## 2019-06-18 DIAGNOSIS — R921 Mammographic calcification found on diagnostic imaging of breast: Secondary | ICD-10-CM | POA: Diagnosis not present

## 2019-06-19 ENCOUNTER — Other Ambulatory Visit (HOSPITAL_COMMUNITY): Payer: Self-pay | Admitting: Cardiology

## 2019-06-19 DIAGNOSIS — I5022 Chronic systolic (congestive) heart failure: Secondary | ICD-10-CM | POA: Diagnosis not present

## 2019-06-20 LAB — BASIC METABOLIC PANEL
BUN/Creatinine Ratio: 12 (ref 12–28)
BUN: 9 mg/dL (ref 8–27)
CO2: 25 mmol/L (ref 20–29)
Calcium: 8.6 mg/dL — ABNORMAL LOW (ref 8.7–10.3)
Chloride: 104 mmol/L (ref 96–106)
Creatinine, Ser: 0.75 mg/dL (ref 0.57–1.00)
GFR calc Af Amer: 93 mL/min/{1.73_m2} (ref >59)
GFR calc non Af Amer: 81 mL/min/{1.73_m2} (ref >59)
Glucose: 89 mg/dL (ref 65–99)
Potassium: 4 mmol/L (ref 3.5–5.2)
Sodium: 141 mmol/L (ref 134–144)

## 2019-06-21 ENCOUNTER — Telehealth: Payer: Self-pay

## 2019-06-21 NOTE — Telephone Encounter (Signed)
Faxed and confirmed biopsy order to [facility]

## 2019-06-25 NOTE — Telephone Encounter (Signed)
From pt

## 2019-07-01 ENCOUNTER — Other Ambulatory Visit: Payer: Self-pay | Admitting: Radiology

## 2019-07-01 DIAGNOSIS — D0511 Intraductal carcinoma in situ of right breast: Secondary | ICD-10-CM | POA: Diagnosis not present

## 2019-07-01 DIAGNOSIS — R921 Mammographic calcification found on diagnostic imaging of breast: Secondary | ICD-10-CM | POA: Diagnosis not present

## 2019-07-04 ENCOUNTER — Encounter: Payer: Self-pay | Admitting: *Deleted

## 2019-07-04 DIAGNOSIS — D0511 Intraductal carcinoma in situ of right breast: Secondary | ICD-10-CM | POA: Insufficient documentation

## 2019-07-09 NOTE — Progress Notes (Signed)
Tami Wood  [phone] Fax:([phone]) [phone]     ID: Tami Wood DOB: [birth year]  MR#: [id number]  KTG#:[id number]  Patient Care Team: Biagio Borg, MD as PCP - General (Internal Medicine) Mauro Kaufmann, RN as Oncology Nurse Navigator Rockwell Germany, RN as Oncology Nurse Navigator Erroll Luna, MD as Consulting Physician (General Surgery) Suhani Stillion, Virgie Dad, MD as Consulting Physician (Oncology) Gery Pray, MD as Consulting Physician (Radiation Oncology) Nigel Mormon, MD as Consulting Physician (Cardiology) Rexene Alberts, MD as Consulting Physician (Cardiothoracic Surgery) Rozetta Nunnery, MD as Consulting Physician (Otolaryngology) Chauncey Cruel, MD OTHER MD:  CHIEF COMPLAINT: Ductal carcinoma in situ, estrogen receptor positive  CURRENT TREATMENT: Awaiting definitive surgery   HISTORY OF CURRENT ILLNESS: Tami Wood had routine screening mammography on 12/18/2018 showing 0.7 cm probably-benign calcifications in the lower-outer right breast, 6 month follow up was recommended. She underwent right diagnostic mammography with tomography at [facility] on 06/18/2019 showing: breast density category C; 0.7 cm grouped calcifications in the lower-outer right breast.  Accordingly on 07/01/2019 she proceeded to biopsy of the right breast area in question. The pathology from this procedure ([id number]) showed: ductal carcinoma in situ with central necrosis and calcifications, intermediate grade. Prognostic indicators significant for: estrogen receptor, 90% positive and progesterone receptor, 5% positive, both with strong staining intensity.   The patient's subsequent history is as detailed below.   INTERVAL HISTORY: Tami Wood was evaluated in the multidisciplinary breast cancer clinic on 07/10/2019 accompanied by her sister Tami Wood. Her case was also presented at the multidisciplinary breast cancer conference on the same day. At that time a  preliminary plan was proposed: Breast conserving surgery with no sentinel lymph node sampling, adjuvant radiation, and antiestrogens.   REVIEW OF SYSTEMS: On the provided questionnaire, Tami Wood reports night sweats, weight change, loss of sleep, wearing glasses/contacts, hearing loss, tinnitus, runny nose and sinus problems, dental problems and gum disease, irregular heartbeat, feet swelling, sleeping with two pillows, incontinence, arthritis, hot flashes, anemia, and forgetfulness. The patient denies unusual headaches, visual changes, nausea, vomiting, stiff neck, dizziness, or gait imbalance. There has been no cough, phlegm production, or pleurisy, no chest pain or pressure, and no change in bowel or bladder habits. The patient denies fever, rash, bleeding, unexplained fatigue or unexplained weight loss. A detailed review of systems was otherwise noncontributory   PAST MEDICAL HISTORY: Past Medical History:  Diagnosis Date  . ALLERGIC RHINITIS 06/18/2009  . ANEMIA-IRON DEFICIENCY 11/28/2006  . ANXIETY 11/28/2006  . Arthritis   . Coronary artery disease   . Cough 06/18/2009  . DEPRESSION 11/28/2006  . GERD 12/10/2007  . History of kidney stones   . HYPERTENSION 11/28/2006  . Hypertension   . Mitral regurgitation   . MURMUR 11/28/2006  . PEPTIC ULCER DISEASE 12/10/2007  . 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 S/P mitral valve repair 04/10/2018   Complex valvuloplasty including artificial Gore-tex neochord placement x8 and Sorin Memo 4D ring annuloplasty, size 28    PAST SURGICAL HISTORY: Past Surgical History:  Procedure Laterality Date  . ABDOMINAL HYSTERECTOMY    . CARDIAC CATHETERIZATION    . CHOLECYSTECTOMY    . CORONARY ARTERY BYPASS GRAFT N/A 04/10/2018   Procedure: CORONARY ARTERY BYPASS GRAFTING (CABG), ON PUMP, TIMES TWO, LIMA TO LAD AND SVG TO RCA USING LEFT INTERNAL MAMMARY ARTERY AND RIGHT GREATER SAPHENOUS VEIN HARVESTED ENDOSCOPICALLY;  Surgeon:  Rexene Alberts, MD;  Location: [facility] OR;  Service: Open Heart Surgery;  Laterality: N/A;  . MITRAL VALVE REPAIR N/A 04/10/2018   Procedure: MITRAL VALVE REPAIR (MVR) USING 28 MEMO 4D;  Surgeon: Rexene Alberts, MD;  Location: [place];  Service: Open Heart Surgery;  Laterality: N/A;  . RIGHT/LEFT HEART CATH AND CORONARY ANGIOGRAPHY N/A 02/06/2018   Procedure: RIGHT/LEFT HEART CATH AND CORONARY ANGIOGRAPHY;  Surgeon: Nigel Mormon, MD;  Location: [place] [facility];  Service: Cardiovascular;  Laterality: N/A;  . s/p multiple breast biopsy     negative  . TEE WITHOUT CARDIOVERSION N/A 02/06/2018   Procedure: TRANSESOPHAGEAL ECHOCARDIOGRAM (TEE);  Surgeon: Nigel Mormon, MD;  Location: [facility] ENDOSCOPY;  Service: Cardiovascular;  Laterality: N/A;  . TEE WITHOUT CARDIOVERSION N/A 04/10/2018   Procedure: TRANSESOPHAGEAL ECHOCARDIOGRAM (TEE);  Surgeon: Rexene Alberts, MD;  Location: [place];  Service: Open Heart Surgery;  Laterality: N/A;    FAMILY HISTORY: Family History  Problem Relation Age of Onset  . Transient ischemic attack Other   . Hypertension Other   . Colon cancer Other   . Heart disease Father   . Heart failure Father   . Breast cancer Sister 64  . Colon cancer Brother   . Cancer Maternal Uncle   . Cancer Paternal Uncle   . Breast cancer Cousin   Her father is age 71 (as of Aug 07, 2019). Her mother died at age 32 from pulmonary fibrosis. Tami Wood has 1 brother and 3 sisters. She reports breast cancer in a sister at age 70 and cancer of unknown type in a cousin. She also reports two uncles with cancer (unknown type), colon cancer in her brother, and a great uncle with leukemia.   GYNECOLOGIC HISTORY:  No LMP recorded. Patient has had a hysterectomy. Menarche: age unsure GX P 0 Contraceptive never used HRT never used  Hysterectomy? yes BSO? no   SOCIAL HISTORY: (updated 07-Aug-2019)  Tami Wood retired from working for [employer].. She is single. She lives at home by  herself, with no pets. She attends a [employer].    ADVANCED DIRECTIVES: Not in place. She intends to name her sister, Tami Wood, as her HCPOA. She can be reached at [phone] (cell).   HEALTH MAINTENANCE: Social History   Tobacco Use  . Smoking status: Never Smoker  . Smokeless tobacco: Never Used  Substance Use Topics  . Alcohol use: No  . Drug use: Never     Colonoscopy: date unknown  PAP: date unknown  Bone density: date unknown   No Known Allergies  Current Outpatient Medications  Medication Sig Dispense Refill  . aspirin 81 MG EC tablet Take 81 mg by mouth daily.      Marland Kitchen atorvastatin (LIPITOR) 80 MG tablet TAKE 1 TABLET BY MOUTH DAILY AT 6 PM 90 tablet 3  . cetirizine (ZYRTEC) 10 MG tablet Take 1 tablet (10 mg total) by mouth daily. 30 tablet 11  . furosemide (LASIX) 20 MG tablet Take 1 tablet (20 mg total) by mouth daily as needed. 30 tablet 2  . metoprolol succinate (TOPROL XL) 50 MG 24 hr tablet Take 1 tablet (50 mg total) by mouth daily. 90 tablet 3  . sacubitril-valsartan (ENTRESTO) 49-51 MG Take 1 tablet by mouth 2 (two) times daily. (Patient taking differently: Take 2 tablets by mouth 2 (two) times daily. ) 60 tablet 3  . sodium chloride (OCEAN) 0.65 % SOLN nasal spray Place 1 spray into both nostrils as needed for congestion.    Marland Kitchen amoxicillin (  AMOXIL) 500 MG capsule Take 2 capsules (1,000 mg total) by mouth 2 (two) times daily. (Patient not taking: Reported on 06/10/2019) 40 capsule 0   No current facility-administered medications for this visit.    OBJECTIVE: African-American Wood in no acute distress  Vitals:   07/10/19 0910  BP: (!) 130/98  Pulse: 68  Resp: 18  Temp: 98.3 F (36.8 C)  SpO2: 100%     Body mass index is 29.79 kg/m.   Wt Readings from Last 3 Encounters:  07/10/19 179 lb (81.2 kg)  06/10/19 179 lb 14.4 oz (81.6 kg)  03/14/19 174 lb (78.9 kg)      ECOG FS:1 - Symptomatic but completely ambulatory  Ocular: Sclerae unicteric,  pupils round and equal Ear-nose-throat: Wearing a mask Lymphatic: No cervical or supraclavicular adenopathy Lungs no rales or rhonchi Heart regular rate and rhythm Abd soft, nontender, positive bowel sounds MSK no focal spinal tenderness, no joint edema; CABG scar Neuro: non-focal, well-oriented, appropriate affect Breasts: The right breast is status post recent biopsy.  It is otherwise unremarkable, with no skin or nipple changes of concern.  The left breast is benign.  Both axillae are benign.   LAB RESULTS:  CMP     Component Value Date/Time   NA 140 07/10/2019 0836   NA 141 06/19/2019 1402   K 4.1 07/10/2019 0836   CL 106 07/10/2019 0836   CO2 30 07/10/2019 0836   GLUCOSE 98 07/10/2019 0836   BUN 9 07/10/2019 0836   BUN 9 06/19/2019 1402   CREATININE 0.77 07/10/2019 0836   CALCIUM 8.8 (L) 07/10/2019 0836   PROT 8.5 (H) 07/10/2019 0836   ALBUMIN 3.4 (L) 07/10/2019 0836   AST 39 07/10/2019 0836   ALT 44 07/10/2019 0836   ALKPHOS 144 (H) 07/10/2019 0836   BILITOT 0.7 07/10/2019 0836   GFRNONAA >60 07/10/2019 0836   GFRAA >60 07/10/2019 0836    No results found for: TOTALPROTELP, ALBUMINELP, A1GS, A2GS, BETS, BETA2SER, GAMS, MSPIKE, SPEI  Lab Results  Component Value Date   WBC 3.6 (L) 07/10/2019   NEUTROABS 1.7 07/10/2019   HGB 11.9 (L) 07/10/2019   HCT 36.9 07/10/2019   MCV 93.9 07/10/2019   PLT 156 07/10/2019    No results found for: LABCA2  No components found for: VQMGQQ761  No results for input(s): INR in the last 168 hours.  No results found for: LABCA2  No results found for: PJK932  No results found for: IZT245  No results found for: YKD983  No results found for: CA2729  No components found for: HGQUANT  No results found for: CEA1 / No results found for: CEA1   No results found for: AFPTUMOR  No results found for: CHROMOGRNA  No results found for: KPAFRELGTCHN, LAMBDASER, KAPLAMBRATIO (kappa/lambda light chains)  No results found for:  HGBA, HGBA2QUANT, HGBFQUANT, HGBSQUAN (Hemoglobinopathy evaluation)   No results found for: LDH  No results found for: IRON, TIBC, IRONPCTSAT (Iron and TIBC)  No results found for: FERRITIN  Urinalysis    Component Value Date/Time   COLORURINE STRAW (A) 04/06/2018 0922   APPEARANCEUR CLEAR 04/06/2018 0922   LABSPEC 1.003 (L) 04/06/2018 0922   PHURINE 7.0 04/06/2018 0922   GLUCOSEU NEGATIVE 04/06/2018 0922   GLUCOSEU NEGATIVE 08/16/2017 1710   HGBUR NEGATIVE 04/06/2018 0922   BILIRUBINUR NEGATIVE 04/06/2018 0922   KETONESUR NEGATIVE 04/06/2018 0922   PROTEINUR NEGATIVE 04/06/2018 0922   UROBILINOGEN 0.2 08/16/2017 1710   NITRITE NEGATIVE 04/06/2018 0922   LEUKOCYTESUR  NEGATIVE 04/06/2018 0922     STUDIES: No results found.   ELIGIBLE FOR AVAILABLE RESEARCH PROTOCOL: given area of necrosis not a good COMET candidate  ASSESSMENT: 69 y.o. Tami Wood status post right breast biopsy 07/01/2019 for ductal carcinoma in situ, grade 2, estrogen and progesterone receptor positive.  (1) breast conserving surgery, no sentinel lymph node sampling  (2) adjuvant radiation  (3) antiestrogens   PLAN: I met today with Tami Wood to review her new diagnosis. Specifically we discussed the biology of her breast cancer, its diagnosis, staging, treatment  options and prognosis. Analyce understands that in noninvasive ductal carcinoma, also called ductal carcinoma in situ ("DCIS") the breast cancer cells remain trapped in the ducts were they started. They cannot travel to a vital organ. For that reason these cancers in themselves are not life-threatening.  If the whole breast is removed then all the ducts are removed and since the cancer cells are trapped in the ducts, the cure rate with mastectomy for noninvasive breast cancer is approximately 99%. Nevertheless we recommend lumpectomy, because there is no survival advantage to mastectomy and because the cosmetic result is generally superior  with breast conservation.  Since the patient is keeping her breast, there will be some risk of recurrence. The recurrence can only be in the same breast since, again, the cells are trapped in the ducts. There is no connection from one breast to the other. The risk of local recurrence is cut by more than half with radiation, which is standard in this situation.  In estrogen receptor positive cancers like Naturi's, anti-estrogens can also be considered. They will further reduce the risk of recurrence by one half. In addition anti-estrogens will lower the risk of a new breast cancer developing in either breast, also by one half. That risk approaches 1% per year.   Accordingly the overall plan is for surgery, followed by radiation, then a discussion of anti-estrogens.  Luvina has a good understanding of the overall plan. She agrees with it. She knows the goal of treatment in her case is cure. She will call with any problems that may develop before her next visit here.  Total encounter time 60 minutes.Sarajane Jews C. Jiaire Rosebrook, MD 07/10/2019 11:19 AM Medical Oncology and Hematology [facility] [place], [place] [address] Tel. [phone]    Fax. [phone]   This document serves as a record of services personally performed by Lurline Del, MD. It was created on his behalf by Wilburn Mylar, a trained medical scribe. The creation of this record is based on the scribe's personal observations and the provider's statements to them.   I, Lurline Del MD, have reviewed the above documentation for accuracy and completeness, and I agree with the above.    *Total Encounter Time as defined by the Centers for Medicare and Medicaid Services includes, in addition to the face-to-face time of a patient visit (documented in the note above) non-face-to-face time: obtaining and reviewing outside history, ordering and reviewing medications, tests or procedures, care  coordination (communications with other health care professionals or caregivers) and documentation in the medical record.

## 2019-07-10 ENCOUNTER — Encounter: Payer: Self-pay | Admitting: *Deleted

## 2019-07-10 ENCOUNTER — Other Ambulatory Visit: Payer: Self-pay

## 2019-07-10 ENCOUNTER — Ambulatory Visit: Payer: Self-pay | Admitting: Surgery

## 2019-07-10 ENCOUNTER — Inpatient Hospital Stay: Payer: Medicare Other

## 2019-07-10 ENCOUNTER — Encounter: Payer: Self-pay | Admitting: Oncology

## 2019-07-10 ENCOUNTER — Inpatient Hospital Stay: Payer: Medicare Other | Attending: Oncology | Admitting: Oncology

## 2019-07-10 ENCOUNTER — Ambulatory Visit
Admission: RE | Admit: 2019-07-10 | Discharge: 2019-07-10 | Disposition: A | Discharge status: Home / Self Care | Payer: Medicare Other | Source: Ambulatory Visit | Attending: Radiation Oncology | Admitting: Radiation Oncology

## 2019-07-10 VITALS — BP 130/98 | HR 68 | Temp 98.3°F | Resp 18 | Ht 65.0 in | Wt 179.0 lb

## 2019-07-10 DIAGNOSIS — K219 Gastro-esophageal reflux disease without esophagitis: Secondary | ICD-10-CM | POA: Diagnosis not present

## 2019-07-10 DIAGNOSIS — I2510 Atherosclerotic heart disease of native coronary artery without angina pectoris: Secondary | ICD-10-CM | POA: Diagnosis not present

## 2019-07-10 DIAGNOSIS — I10 Essential (primary) hypertension: Secondary | ICD-10-CM | POA: Diagnosis not present

## 2019-07-10 DIAGNOSIS — Z951 Presence of aortocoronary bypass graft: Secondary | ICD-10-CM

## 2019-07-10 DIAGNOSIS — Z170 Estrogen receptor positive status [ER+]: Secondary | ICD-10-CM

## 2019-07-10 DIAGNOSIS — D0511 Intraductal carcinoma in situ of right breast: Secondary | ICD-10-CM | POA: Insufficient documentation

## 2019-07-10 DIAGNOSIS — N641 Fat necrosis of breast: Secondary | ICD-10-CM | POA: Diagnosis not present

## 2019-07-10 DIAGNOSIS — Z803 Family history of malignant neoplasm of breast: Secondary | ICD-10-CM | POA: Diagnosis not present

## 2019-07-10 DIAGNOSIS — Z9889 Other specified postprocedural states: Secondary | ICD-10-CM

## 2019-07-10 LAB — CMP (CANCER CENTER ONLY)
ALT: 44 U/L (ref 0–44)
AST: 39 U/L (ref 15–41)
Albumin: 3.4 g/dL — ABNORMAL LOW (ref 3.5–5.0)
Alkaline Phosphatase: 144 U/L — ABNORMAL HIGH (ref 38–126)
Anion gap: 4 — ABNORMAL LOW (ref 5–15)
BUN: 9 mg/dL (ref 8–23)
CO2: 30 mmol/L (ref 22–32)
Calcium: 8.8 mg/dL — ABNORMAL LOW (ref 8.9–10.3)
Chloride: 106 mmol/L (ref 98–111)
Creatinine: 0.77 mg/dL (ref 0.44–1.00)
GFR, Est AFR Am: >60 mL/min (ref >60)
GFR, Estimated: >60 mL/min (ref >60)
Glucose, Bld: 98 mg/dL (ref 70–99)
Potassium: 4.1 mmol/L (ref 3.5–5.1)
Sodium: 140 mmol/L (ref 135–145)
Total Bilirubin: 0.7 mg/dL (ref 0.3–1.2)
Total Protein: 8.5 g/dL — ABNORMAL HIGH (ref 6.5–8.1)

## 2019-07-10 LAB — CBC WITH DIFFERENTIAL (CANCER CENTER ONLY)
Abs Immature Granulocytes: 0.01 10*3/uL (ref 0.00–0.07)
Basophils Absolute: 0 10*3/uL (ref 0.0–0.1)
Basophils Relative: 1 %
Eosinophils Absolute: 0 10*3/uL (ref 0.0–0.5)
Eosinophils Relative: 0 %
HCT: 36.9 % (ref 36.0–46.0)
Hemoglobin: 11.9 g/dL — ABNORMAL LOW (ref 12.0–15.0)
Immature Granulocytes: 0 %
Lymphocytes Relative: 37 %
Lymphs Abs: 1.3 10*3/uL (ref 0.7–4.0)
MCH: 30.3 pg (ref 26.0–34.0)
MCHC: 32.2 g/dL (ref 30.0–36.0)
MCV: 93.9 fL (ref 80.0–100.0)
Monocytes Absolute: 0.5 10*3/uL (ref 0.1–1.0)
Monocytes Relative: 14 %
Neutro Abs: 1.7 10*3/uL (ref 1.7–7.7)
Neutrophils Relative %: 48 %
Platelet Count: 156 10*3/uL (ref 150–400)
RBC: 3.93 MIL/uL (ref 3.87–5.11)
RDW: 12.9 % (ref 11.5–15.5)
WBC Count: 3.6 10*3/uL — ABNORMAL LOW (ref 4.0–10.5)
nRBC: 0 % (ref 0.0–0.2)

## 2019-07-10 LAB — GENETIC SCREENING ORDER

## 2019-07-10 NOTE — H&P (Signed)
Tami Wood Documented: 07/10/2019 8:15 AM Location: [facility] Patient #: [id number] DOB: [birth year] Undefined / Language: Tami Wood / Race: Black or African American Female  History of Present Illness Tami Wood A. Tami Batres MD; 07/10/2019 11:31 AM) Patient words: Pt sent at the request of Dr Tami Wood for abnormal screening mammogram. 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 She denies pain, mass or discharge.  The patient is a 69 year old female.   Medication History Tami Slipper, RN; 07/10/2019 8:15 AM) Medications Reconciled     Review of Systems Tami Wood A. Tami Carlini MD; 07/10/2019 11:40 AM) All other systems negative   Physical Exam (Tami Wood A. Tami Mehlman MD; 07/10/2019 11:32 AM)  General Mental Status-Alert. General Appearance-Consistent with stated age. Hydration-Well hydrated. Voice-Normal.  Head and Neck Head-normocephalic, atraumatic with no lesions or palpable masses. Trachea-midline. Thyroid Gland Characteristics - normal size and consistency.  Chest and Lung Exam Note: sternotomy scar noted  Breast Breast - Left-Symmetric, Non Tender, No Biopsy scars, no Dimpling - Left, No Inflammation, No Lumpectomy scars, No Mastectomy scars, No Peau d' Orange. Breast - Right-Symmetric, Non Tender, No Biopsy scars, no Dimpling - Right, No Inflammation, No Lumpectomy scars, No Mastectomy scars, No Peau d' Orange. Breast Lump-No Palpable Breast Mass.  Cardiovascular Cardiovascular examination reveals -normal heart sounds, regular rate and rhythm with no murmurs and normal pedal pulses bilaterally.  Neurologic Neurologic evaluation reveals -alert and oriented x 3 with no impairment of recent or remote memory. Mental Status-Normal.  Neuropsychiatric Examination of related systems reveals-The patient is well-nourished and well-groomed. Mental status exam  performed with findings of-Oriented X3 with appropriate mood and affect. Associations-intact.  Musculoskeletal Normal Exam - Left-Upper Extremity Strength Normal and Lower Extremity Strength Normal. Normal Exam - Right-Upper Extremity Strength Normal and Lower Extremity Strength Normal.  Lymphatic Head & Neck  General Head & Neck Lymphatics: Bilateral - Description - Normal. Axillary  General Axillary Region: Bilateral - Description - Normal. Tenderness - Non Tender.    Assessment & Plan (Tami Wood A. Tami Monaco MD; 07/10/2019 11:40 AM)  BREAST NEOPLASM, TIS (DCIS), RIGHT (D05.11) Impression: discussed lumpectomy right breast seed localization discussed mastectomy pt opted for lumpectomy Risk of lumpectomy include bleeding, infection, seroma, more surgery, use of seed/wire, wound care, cosmetic deformity and the need for other treatments, death , blood clots, death. Pt agrees to proceed.  45 min total time face to face, exam, documentation review pathology and discussion of surgery complication risk  Current Plans You are being scheduled for surgery- Our schedulers will call you.  You should hear from our office's scheduling department within 5 working days about the location, date, and time of surgery. We try to make accommodations for patient's preferences in scheduling surgery, but sometimes the OR schedule or the surgeon's schedule prevents [place] from making those accommodations.  If you have not heard from our office [phone]) in 5 working days, call the office and ask for your surgeon's nurse.  If you have other questions about your diagnosis, plan, or surgery, call the office and ask for your surgeon's nurse.  Pt Education - [facility] Breast Cancer Information Given - [employer] "Breast Journey" Package Pt Education - [facility] Breast Biopsy HCI: discussed with patient and provided information. Pt Education - ABC (After Breast Cancer) Class Info: discussed with patient and provided  information. We discussed the staging and pathophysiology of breast cancer. We discussed all of the different options for treatment for breast cancer including  surgery, chemotherapy, radiation therapy, Herceptin, and antiestrogen therapy. We discussed a sentinel lymph node biopsy as she does not appear to having lymph node involvement right now. We discussed the performance of that with injection of radioactive tracer and blue dye. We discussed that she would have an incision underneath her axillary hairline. We discussed that there is a bout a 10-20% chance of having a positive node with a sentinel lymph node biopsy and we will await the permanent pathology to make any other first further decisions in terms of her treatment. One of these options might be to return to the operating room to perform an axillary lymph node dissection. We discussed about a 1-2% risk lifetime of chronic shoulder pain as well as lymphedema associated with a sentinel lymph node biopsy. We discussed the options for treatment of the breast cancer which included lumpectomy versus a mastectomy. We discussed the performance of the lumpectomy with a wire placement. We discussed a 10-20% chance of a positive margin requiring reexcision in the operating room. We also discussed that she may need radiation therapy or antiestrogen therapy or both if she undergoes lumpectomy. We discussed the mastectomy and the postoperative care for that as well. We discussed that there is no difference in her survival whether she undergoes lumpectomy with radiation therapy or antiestrogen therapy versus a mastectomy. There is a slight difference in the local recurrence rate being 3-5% with lumpectomy and about 1% with a mastectomy. We discussed the risks of operation including bleeding, infection, possible reoperation. She understands her further therapy will be based on what her stages at the time of her operation.

## 2019-07-10 NOTE — H&P (View-Only) (Signed)
Tami Wood Documented: 07/10/2019 8:15 AM Location: [facility] Patient #: [id number] DOB: 1950/08/18 Undefined / Language: Cleophus Molt / Race: Black or African American Female  History of Present Illness Marcello Moores A. Loraina Stauffer MD; 07/10/2019 11:31 AM) Patient words: Pt sent at the request of Dr Luan Pulling for abnormal screening mammogram. 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 She denies pain, mass or discharge.  The patient is a 69 year old female.   Medication History Conni Slipper, RN; 07/10/2019 8:15 AM) Medications Reconciled     Review of Systems Marcello Moores A. Shanaye Rief MD; 07/10/2019 11:40 AM) All other systems negative   Physical Exam (Biviana Saddler A. Carvel Huskins MD; 07/10/2019 11:32 AM)  General Mental Status-Alert. General Appearance-Consistent with stated age. Hydration-Well hydrated. Voice-Normal.  Head and Neck Head-normocephalic, atraumatic with no lesions or palpable masses. Trachea-midline. Thyroid Gland Characteristics - normal size and consistency.  Chest and Lung Exam Note: sternotomy scar noted  Breast Breast - Left-Symmetric, Non Tender, No Biopsy scars, no Dimpling - Left, No Inflammation, No Lumpectomy scars, No Mastectomy scars, No Peau d' Orange. Breast - Right-Symmetric, Non Tender, No Biopsy scars, no Dimpling - Right, No Inflammation, No Lumpectomy scars, No Mastectomy scars, No Peau d' Orange. Breast Lump-No Palpable Breast Mass.  Cardiovascular Cardiovascular examination reveals -normal heart sounds, regular rate and rhythm with no murmurs and normal pedal pulses bilaterally.  Neurologic Neurologic evaluation reveals -alert and oriented x 3 with no impairment of recent or remote memory. Mental Status-Normal.  Neuropsychiatric Examination of related systems reveals-The patient is well-nourished and well-groomed. Mental status exam  performed with findings of-Oriented X3 with appropriate mood and affect. Associations-intact.  Musculoskeletal Normal Exam - Left-Upper Extremity Strength Normal and Lower Extremity Strength Normal. Normal Exam - Right-Upper Extremity Strength Normal and Lower Extremity Strength Normal.  Lymphatic Head & Neck  General Head & Neck Lymphatics: Bilateral - Description - Normal. Axillary  General Axillary Region: Bilateral - Description - Normal. Tenderness - Non Tender.    Assessment & Plan (Jessiah Wojnar A. Jovahn Breit MD; 07/10/2019 11:40 AM)  BREAST NEOPLASM, TIS (DCIS), RIGHT (D05.11) Impression: discussed lumpectomy right breast seed localization discussed mastectomy pt opted for lumpectomy Risk of lumpectomy include bleeding, infection, seroma, more surgery, use of seed/wire, wound care, cosmetic deformity and the need for other treatments, death , blood clots, death. Pt agrees to proceed.  45 min total time face to face, exam, documentation review pathology and discussion of surgery complication risk  Current Plans You are being scheduled for surgery- Our schedulers will call you.  You should hear from our office's scheduling department within 5 working days about the location, date, and time of surgery. We try to make accommodations for patient's preferences in scheduling surgery, but sometimes the OR schedule or the surgeon's schedule prevents [place] from making those accommodations.  If you have not heard from our office [phone]) in 5 working days, call the office and ask for your surgeon's nurse.  If you have other questions about your diagnosis, plan, or surgery, call the office and ask for your surgeon's nurse.  Pt Education - [facility] Breast Cancer Information Given - [facility] "Breast Journey" Package Pt Education - [facility] Breast Biopsy HCI: discussed with patient and provided information. Pt Education - ABC (After Breast Cancer) Class Info: discussed with patient and provided  information. We discussed the staging and pathophysiology of breast cancer. We discussed all of the different options for treatment for breast cancer including  surgery, chemotherapy, radiation therapy, Herceptin, and antiestrogen therapy. We discussed a sentinel lymph node biopsy as she does not appear to having lymph node involvement right now. We discussed the performance of that with injection of radioactive tracer and blue dye. We discussed that she would have an incision underneath her axillary hairline. We discussed that there is a bout a 10-20% chance of having a positive node with a sentinel lymph node biopsy and we will await the permanent pathology to make any other first further decisions in terms of her treatment. One of these options might be to return to the operating room to perform an axillary lymph node dissection. We discussed about a 1-2% risk lifetime of chronic shoulder pain as well as lymphedema associated with a sentinel lymph node biopsy. We discussed the options for treatment of the breast cancer which included lumpectomy versus a mastectomy. We discussed the performance of the lumpectomy with a wire placement. We discussed a 10-20% chance of a positive margin requiring reexcision in the operating room. We also discussed that she may need radiation therapy or antiestrogen therapy or both if she undergoes lumpectomy. We discussed the mastectomy and the postoperative care for that as well. We discussed that there is no difference in her survival whether she undergoes lumpectomy with radiation therapy or antiestrogen therapy versus a mastectomy. There is a slight difference in the local recurrence rate being 3-5% with lumpectomy and about 1% with a mastectomy. We discussed the risks of operation including bleeding, infection, possible reoperation. She understands her further therapy will be based on what her stages at the time of her operation.

## 2019-07-10 NOTE — Progress Notes (Signed)
Radiation Oncology         [phone] ________________________________  Multidisciplinary Breast Oncology Clinic [facility]) Initial Outpatient Consultation  Name: Tami Wood MRN: [id number]  Date: 07/10/2019  DOB: Apr 26, 1950  [id number], Hunt Oris, MD  Erroll Luna, MD   REFERRING PHYSICIAN: Erroll Luna, MD  DIAGNOSIS: The encounter diagnosis was Ductal carcinoma in situ (DCIS) of right breast.  Stage 0 Right Breast LOQ, DCIS, ER+ / PR+, Grade 2    ICD-10-CM   1. Ductal carcinoma in situ (DCIS) of right breast  D05.11     HISTORY OF PRESENT ILLNESS::Tami Wood is a 69 y.o. female who is presenting to the office today for evaluation of her newly diagnosed breast cancer. She is accompanied by her sister. She is doing well overall.   She had routine screening mammography on 12/18/2018 that showed a 0.7 cm probably-benign calcifications in the lower outer right breast. She underwent unilateral diagnostic mammography with tomography and right breast ultrasonography at [facility] on 06/18/2019 that showed a 0.7 cm grouped amorphous calcifications in the right breast, suspicious for malignancy.  Biopsy on 07/01/2019 showed intermediate nuclear grade ductal carcinoma in situ with central necrosis and calcifications. Prognostic indicators significant for estrogen receptor, 90% positive and progesterone receptor, 5% positive, both with strong staining intensities.  Menarche: N/A Age at first live birth: N/A GP: N/A LMP: N/A Contraceptive: None HRT: No   The patient was referred today for presentation in the multidisciplinary conference.  Radiology studies and pathology slides were presented there for review and discussion of treatment options.  A consensus was discussed regarding potential next steps.  PREVIOUS RADIATION THERAPY: No  PAST MEDICAL HISTORY:  Past Medical History:  Diagnosis Date  . ALLERGIC RHINITIS 06/18/2009  . ANEMIA-IRON DEFICIENCY 11/28/2006  . ANXIETY 11/28/2006  .  Arthritis   . Coronary artery disease   . Cough 06/18/2009  . DEPRESSION 11/28/2006  . GERD 12/10/2007  . History of kidney stones   . HYPERTENSION 11/28/2006  . Hypertension   . Mitral regurgitation   . MURMUR 11/28/2006  . PEPTIC ULCER DISEASE 12/10/2007  . 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 S/P mitral valve repair 04/10/2018   Complex valvuloplasty including artificial Gore-tex neochord placement x8 and Sorin Memo 4D ring annuloplasty, size 28    PAST SURGICAL HISTORY: Past Surgical History:  Procedure Laterality Date  . ABDOMINAL HYSTERECTOMY    . CARDIAC CATHETERIZATION    . CHOLECYSTECTOMY    . CORONARY ARTERY BYPASS GRAFT N/A 04/10/2018   Procedure: CORONARY ARTERY BYPASS GRAFTING (CABG), ON PUMP, TIMES TWO, LIMA TO LAD AND SVG TO RCA USING LEFT INTERNAL MAMMARY ARTERY AND RIGHT GREATER SAPHENOUS VEIN HARVESTED ENDOSCOPICALLY;  Surgeon: Rexene Alberts, MD;  Location: [place];  Service: Open Heart Surgery;  Laterality: N/A;  . MITRAL VALVE REPAIR N/A 04/10/2018   Procedure: MITRAL VALVE REPAIR (MVR) USING 28 MEMO 4D;  Surgeon: Rexene Alberts, MD;  Location: [place];  Service: Open Heart Surgery;  Laterality: N/A;  . RIGHT/LEFT HEART CATH AND CORONARY ANGIOGRAPHY N/A 02/06/2018   Procedure: RIGHT/LEFT HEART CATH AND CORONARY ANGIOGRAPHY;  Surgeon: Nigel Mormon, MD;  Location: [place] [facility];  Service: Cardiovascular;  Laterality: N/A;  . s/p multiple breast biopsy     negative  . TEE WITHOUT CARDIOVERSION N/A 02/06/2018   Procedure: TRANSESOPHAGEAL ECHOCARDIOGRAM (TEE);  Surgeon: Nigel Mormon, MD;  Location: [place];  Service: Cardiovascular;  Laterality: N/A;  . TEE WITHOUT CARDIOVERSION N/A 04/10/2018   Procedure: TRANSESOPHAGEAL ECHOCARDIOGRAM (TEE);  Surgeon: Rexene Alberts, MD;  Location: [place];  Service: Open Heart Surgery;  Laterality: N/A;    FAMILY HISTORY:  Family History  Problem Relation Age of Onset  .  Transient ischemic attack Other   . Hypertension Other   . Colon cancer Other   . Heart disease Father   . Heart failure Father   . Breast cancer Sister 82  . Colon cancer Brother   . Cancer Maternal Uncle   . Cancer Paternal Uncle   . Breast cancer Cousin     SOCIAL HISTORY:  Social History   Socioeconomic History  . Marital status: Single    Spouse name: Not on file  . Number of children: 0  . Years of education: Not on file  . Highest education level: High school graduate  Occupational History  . Not on file  Tobacco Use  . Smoking status: Never Smoker  . Smokeless tobacco: Never Used  Substance and Sexual Activity  . Alcohol use: No  . Drug use: Never  . Sexual activity: Not on file  Other Topics Concern  . Not on file  Social History Narrative  . Not on file   Social Determinants of Health   Financial Resource Strain: Low Risk   . Difficulty of Paying Living Expenses: Not hard at all  Food Insecurity:   . Worried About Charity fundraiser in the Last Year:   . Arboriculturist in the Last Year:   Transportation Needs:   . Film/video editor (Medical):   Marland Kitchen Lack of Transportation (Non-Medical):   Physical Activity:   . Days of Exercise per Week:   . Minutes of Exercise per Session:   Stress:   . Feeling of Stress :   Social Connections:   . Frequency of Communication with Friends and Family:   . Frequency of Social Gatherings with Friends and Family:   . Attends Religious Services:   . Active Member of Clubs or Organizations:   . Attends Archivist Meetings:   Marland Kitchen Marital Status:     ALLERGIES: No Known Allergies  MEDICATIONS:  Current Outpatient Medications  Medication Sig Dispense Refill  . amoxicillin (AMOXIL) 500 MG capsule Take 2 capsules (1,000 mg total) by mouth 2 (two) times daily. (Patient not taking: Reported on 06/10/2019) 40 capsule 0  . aspirin 81 MG EC tablet Take 81 mg by mouth daily.      Marland Kitchen atorvastatin (LIPITOR) 80 MG  tablet TAKE 1 TABLET BY MOUTH DAILY AT 6 PM 90 tablet 3  . cetirizine (ZYRTEC) 10 MG tablet Take 1 tablet (10 mg total) by mouth daily. 30 tablet 11  . furosemide (LASIX) 20 MG tablet Take 1 tablet (20 mg total) by mouth daily as needed. 30 tablet 2  . metoprolol succinate (TOPROL XL) 50 MG 24 hr tablet Take 1 tablet (50 mg total) by mouth daily. 90 tablet 3  . sacubitril-valsartan (ENTRESTO) 49-51 MG Take 1 tablet by mouth 2 (two) times daily. (Patient taking differently: Take 2 tablets by mouth 2 (two) times daily. ) 60 tablet 3  . sodium chloride (OCEAN) 0.65 % SOLN nasal spray Place 1 spray into both nostrils as needed for congestion.     No current facility-administered medications for this encounter.    REVIEW OF SYSTEMS: A 10+ POINT REVIEW OF SYSTEMS WAS OBTAINED including neurology, dermatology, psychiatry, cardiac, respiratory,  lymph, extremities, GI, GU, musculoskeletal, constitutional, reproductive, HEENT. On the provided form, she reports night sweats, weight change, loss of sleep, hearing loss, tinnitus, rhinorrhea, sinus problems, dental problems, gum disease, irregular heartbeat, pedal edema, urinary incontinence, forgetfulness, and hot flashes. She also reports history of anemia and wears glasses/contacts. She denies abdominal pain, nausea, vomiting, diarrhea, breast pain, skin changes, and any other symptoms.  Prior to diagnosis patient denied any pain within the breast area nipple discharge or bleeding   PHYSICAL EXAM:   Vitals with BMI 07/10/2019  Height 5\' 5"   Weight 179 lbs  BMI [id number]  Systolic [id number]  Diastolic 98  Pulse 68   Lungs are clear to auscultation bilaterally. Heart has regular rate and rhythm. No palpable cervical, supraclavicular, or axillary adenopathy. Abdomen soft, non-tender, normal bowel sounds. Left breast with no palpable mass, nipple discharge, or bleeding.  Right breast with a small biopsy site in the right lower outer quadrant with some bruising  present. No dominant mass appreciated. No nipple discharge or bleeding.   KPS = 90  100 - Normal; no complaints; no evidence of disease. 90   - Able to carry on normal activity; minor signs or symptoms of disease. 80   - Normal activity with effort; some signs or symptoms of disease. 19   - Cares for self; unable to carry on normal activity or to do active work. 60   - Requires occasional assistance, but is able to care for most of his personal needs. 50   - Requires considerable assistance and frequent medical care. 57   - Disabled; requires special care and assistance. 77   - Severely disabled; hospital admission is indicated although death not imminent. 51   - Very sick; hospital admission necessary; active supportive treatment necessary. 10   - Moribund; fatal processes progressing rapidly. 0     - Dead  Karnofsky DA, Abelmann Marion, Craver LS and Burchenal [facility] [phone]) The use of the nitrogen mustards in the palliative treatment of carcinoma: with particular reference to bronchogenic carcinoma Cancer 1 634-56  LABORATORY DATA:  Lab Results  Component Value Date   WBC 3.6 (L) 07/10/2019   HGB 11.9 (L) 07/10/2019   HCT 36.9 07/10/2019   MCV 93.9 07/10/2019   PLT 156 07/10/2019   Lab Results  Component Value Date   NA 140 07/10/2019   K 4.1 07/10/2019   CL 106 07/10/2019   CO2 30 07/10/2019   Lab Results  Component Value Date   ALT 44 07/10/2019   AST 39 07/10/2019   ALKPHOS 144 (H) 07/10/2019   BILITOT 0.7 07/10/2019    PULMONARY FUNCTION TEST:   Recent Review Flowsheet Data    There is no flowsheet data to display.      RADIOGRAPHY: No results found.    IMPRESSION: Stage 0 Right Breast LOQ, DCIS, ER+ / PR+, Grade 2  Patient will be a good candidate for breast conservation and a potential candidate for radiation therapy directed at the right breast depending on the final pathology report.  The patient's pathology report does show necrosis an indication of more  aggressive DCIS.  she would appear to be a good candidate for hypo-fractionated accelerated radiation therapy over approximately 3-4 weeks. We discussed the general course of radiation, potential side effects, and toxicities with radiation. The patient is understanding of her recommended course of treatment.   PLAN:  1. Right lumpectomy 2. Adjuvant radiation therapy TBD 3. Aromatase inhibitor   ------------------------------------------------  Blair Promise,  PhD, MD  This document serves as a record of services personally performed by Gery Pray, MD. It was created on his behalf by Clerance Lav, a trained medical scribe. The creation of this record is based on the scribe's personal observations and the provider's statements to them. This document has been checked and approved by the attending provider.

## 2019-07-11 ENCOUNTER — Telehealth: Payer: Self-pay | Admitting: *Deleted

## 2019-07-11 ENCOUNTER — Telehealth: Payer: Self-pay | Admitting: Oncology

## 2019-07-11 NOTE — Telephone Encounter (Signed)
Scheduled appts per 4/14 los. Pt confirmed appt date and time.

## 2019-07-11 NOTE — Telephone Encounter (Signed)
From patient.

## 2019-07-11 NOTE — Telephone Encounter (Signed)
Pt left msg that some information in her chart would need correction. I lft msg to speak with pt to get info corrected.  Waiting for clbk

## 2019-07-18 ENCOUNTER — Encounter: Payer: Self-pay | Admitting: *Deleted

## 2019-07-18 NOTE — Progress Notes (Signed)
Spoke with patient to follow up from [facility] and to assess navigation needs.  Patient denies any needs or concerns at this time.

## 2019-07-23 ENCOUNTER — Encounter: Payer: Self-pay | Admitting: *Deleted

## 2019-07-24 ENCOUNTER — Telehealth: Payer: Self-pay | Admitting: *Deleted

## 2019-07-24 ENCOUNTER — Other Ambulatory Visit: Payer: Self-pay

## 2019-07-24 DIAGNOSIS — I5022 Chronic systolic (congestive) heart failure: Secondary | ICD-10-CM

## 2019-07-24 DIAGNOSIS — I10 Essential (primary) hypertension: Secondary | ICD-10-CM

## 2019-07-24 MED ORDER — ENTRESTO 49-51 MG PO TABS: 1.0000 | ORAL_TABLET | Freq: Two times a day (BID) | ORAL | 0 refills | Status: DC

## 2019-07-24 NOTE — Telephone Encounter (Signed)
Pt called requesting sx date so she can schedule her cataract sx. Informed pt she will receive a call from [place] at Dr. Josetta Huddle office. Gave pt number for Debbie as well. Denies further needs at this time

## 2019-07-30 ENCOUNTER — Other Ambulatory Visit: Payer: Self-pay

## 2019-07-30 ENCOUNTER — Encounter (HOSPITAL_BASED_OUTPATIENT_CLINIC_OR_DEPARTMENT_OTHER): Payer: Self-pay | Admitting: Surgery

## 2019-07-30 NOTE — Progress Notes (Signed)
Reviewed chart with Dr. Sabra Heck. OK to proceed with surgery as scheduled without further cardiac testing/clearance.   Left message with Hassan Rowan at Dr. Josetta Huddle office to get aspirin hold orders for DOS.

## 2019-08-01 ENCOUNTER — Other Ambulatory Visit: Payer: Self-pay | Admitting: *Deleted

## 2019-08-01 ENCOUNTER — Encounter: Payer: Self-pay | Admitting: *Deleted

## 2019-08-01 DIAGNOSIS — D0511 Intraductal carcinoma in situ of right breast: Secondary | ICD-10-CM

## 2019-08-02 NOTE — Progress Notes (Signed)

## 2019-08-03 ENCOUNTER — Other Ambulatory Visit (HOSPITAL_COMMUNITY)
Admission: RE | Admit: 2019-08-03 | Discharge: 2019-08-03 | Disposition: A | Discharge status: Home / Self Care | Payer: Medicare Other | Source: Ambulatory Visit | Attending: Surgery | Admitting: Surgery

## 2019-08-03 DIAGNOSIS — Z01812 Encounter for preprocedural laboratory examination: Secondary | ICD-10-CM | POA: Diagnosis not present

## 2019-08-03 DIAGNOSIS — Z20822 Contact with and (suspected) exposure to covid-19: Secondary | ICD-10-CM | POA: Diagnosis not present

## 2019-08-03 LAB — SARS CORONAVIRUS 2 (TAT 6-24 HRS): SARS Coronavirus 2: NEGATIVE

## 2019-08-06 DIAGNOSIS — D0511 Intraductal carcinoma in situ of right breast: Secondary | ICD-10-CM | POA: Diagnosis not present

## 2019-08-07 ENCOUNTER — Encounter (HOSPITAL_BASED_OUTPATIENT_CLINIC_OR_DEPARTMENT_OTHER): Payer: Self-pay | Admitting: Surgery

## 2019-08-07 ENCOUNTER — Ambulatory Visit (HOSPITAL_BASED_OUTPATIENT_CLINIC_OR_DEPARTMENT_OTHER)
Admission: RE | Admit: 2019-08-07 | Discharge: 2019-08-07 | Disposition: A | Discharge status: Home / Self Care | Payer: Medicare Other | Attending: Surgery | Admitting: Surgery

## 2019-08-07 ENCOUNTER — Encounter (HOSPITAL_BASED_OUTPATIENT_CLINIC_OR_DEPARTMENT_OTHER)
Admission: RE | Disposition: A | Discharge status: Home / Self Care | Payer: Self-pay | Source: Home / Self Care | Attending: Surgery

## 2019-08-07 ENCOUNTER — Other Ambulatory Visit: Payer: Self-pay

## 2019-08-07 ENCOUNTER — Ambulatory Visit (HOSPITAL_BASED_OUTPATIENT_CLINIC_OR_DEPARTMENT_OTHER): Payer: Medicare Other | Admitting: Certified Registered"

## 2019-08-07 DIAGNOSIS — I5022 Chronic systolic (congestive) heart failure: Secondary | ICD-10-CM | POA: Diagnosis not present

## 2019-08-07 DIAGNOSIS — I10 Essential (primary) hypertension: Secondary | ICD-10-CM | POA: Insufficient documentation

## 2019-08-07 DIAGNOSIS — D0511 Intraductal carcinoma in situ of right breast: Secondary | ICD-10-CM | POA: Diagnosis not present

## 2019-08-07 DIAGNOSIS — I110 Hypertensive heart disease with heart failure: Secondary | ICD-10-CM | POA: Diagnosis not present

## 2019-08-07 DIAGNOSIS — D241 Benign neoplasm of right breast: Secondary | ICD-10-CM | POA: Diagnosis not present

## 2019-08-07 DIAGNOSIS — I2510 Atherosclerotic heart disease of native coronary artery without angina pectoris: Secondary | ICD-10-CM | POA: Diagnosis not present

## 2019-08-07 DIAGNOSIS — N6011 Diffuse cystic mastopathy of right breast: Secondary | ICD-10-CM | POA: Diagnosis not present

## 2019-08-07 DIAGNOSIS — N6021 Fibroadenosis of right breast: Secondary | ICD-10-CM | POA: Diagnosis not present

## 2019-08-07 DIAGNOSIS — N6489 Other specified disorders of breast: Secondary | ICD-10-CM | POA: Diagnosis not present

## 2019-08-07 HISTORY — PX: BREAST LUMPECTOMY WITH RADIOACTIVE SEED LOCALIZATION: SHX6424

## 2019-08-07 SURGERY — BREAST LUMPECTOMY WITH RADIOACTIVE SEED LOCALIZATION
Anesthesia: General | Site: Breast | Laterality: Right

## 2019-08-07 MED ORDER — ACETAMINOPHEN 500 MG PO TABS
1000.0000 mg | ORAL_TABLET | ORAL | Status: AC
  Administered 2019-08-07: 10:00:00 1000 mg via ORAL

## 2019-08-07 MED ORDER — ONDANSETRON HCL 4 MG/2ML IJ SOLN
INTRAMUSCULAR | Status: AC
  Filled 2019-08-07: qty 12

## 2019-08-07 MED ORDER — LIDOCAINE 2% (20 MG/ML) 5 ML SYRINGE
INTRAMUSCULAR | Status: AC
  Filled 2019-08-07: qty 5

## 2019-08-07 MED ORDER — OXYCODONE HCL 5 MG PO TABS: 5.0000 mg | ORAL_TABLET | Freq: Once | ORAL | Status: DC | PRN

## 2019-08-07 MED ORDER — MIDAZOLAM HCL 2 MG/2ML IJ SOLN: 1.0000 mg | INTRAMUSCULAR | Status: DC | PRN

## 2019-08-07 MED ORDER — PROPOFOL 10 MG/ML IV BOLUS
INTRAVENOUS | Status: DC | PRN
  Administered 2019-08-07: 100 mg via INTRAVENOUS

## 2019-08-07 MED ORDER — EPHEDRINE 5 MG/ML INJ
INTRAVENOUS | Status: AC
  Filled 2019-08-07: qty 20

## 2019-08-07 MED ORDER — BUPIVACAINE HCL (PF) 0.25 % IJ SOLN
INTRAMUSCULAR | Status: AC
  Filled 2019-08-07: qty 30

## 2019-08-07 MED ORDER — PROPOFOL 500 MG/50ML IV EMUL
INTRAVENOUS | Status: DC | PRN
  Administered 2019-08-07: 25 ug/kg/min via INTRAVENOUS

## 2019-08-07 MED ORDER — CEFAZOLIN SODIUM-DEXTROSE 2-4 GM/100ML-% IV SOLN
2.0000 g | INTRAVENOUS | Status: AC
  Administered 2019-08-07: 2 g via INTRAVENOUS

## 2019-08-07 MED ORDER — LACTATED RINGERS IV SOLN: INTRAVENOUS | Status: DC

## 2019-08-07 MED ORDER — FENTANYL CITRATE (PF) 100 MCG/2ML IJ SOLN
50.0000 ug | INTRAMUSCULAR | Status: DC | PRN
  Administered 2019-08-07: 50 ug via INTRAVENOUS

## 2019-08-07 MED ORDER — CELECOXIB 200 MG PO CAPS
ORAL_CAPSULE | ORAL | Status: AC
  Filled 2019-08-07: qty 1

## 2019-08-07 MED ORDER — OXYCODONE HCL 5 MG/5ML PO SOLN: 5.0000 mg | Freq: Once | ORAL | Status: DC | PRN

## 2019-08-07 MED ORDER — LIDOCAINE 2% (20 MG/ML) 5 ML SYRINGE
INTRAMUSCULAR | Status: AC
  Filled 2019-08-07: qty 15

## 2019-08-07 MED ORDER — DEXAMETHASONE SODIUM PHOSPHATE 10 MG/ML IJ SOLN
INTRAMUSCULAR | Status: AC
  Filled 2019-08-07: qty 2

## 2019-08-07 MED ORDER — ACETAMINOPHEN 500 MG PO TABS
ORAL_TABLET | ORAL | Status: AC
  Filled 2019-08-07: qty 2

## 2019-08-07 MED ORDER — FENTANYL CITRATE (PF) 100 MCG/2ML IJ SOLN
INTRAMUSCULAR | Status: AC
  Filled 2019-08-07: qty 2

## 2019-08-07 MED ORDER — ONDANSETRON HCL 4 MG/2ML IJ SOLN: 4.0000 mg | Freq: Once | INTRAMUSCULAR | Status: DC | PRN

## 2019-08-07 MED ORDER — OXYCODONE HCL 5 MG PO TABS
5.0000 mg | ORAL_TABLET | Freq: Four times a day (QID) | ORAL | 0 refills | Status: DC | PRN
Start: 2019-08-07 — End: 2019-09-16

## 2019-08-07 MED ORDER — CHLORHEXIDINE GLUCONATE CLOTH 2 % EX PADS: 6.0000 | MEDICATED_PAD | Freq: Once | CUTANEOUS | Status: DC

## 2019-08-07 MED ORDER — CEFAZOLIN SODIUM-DEXTROSE 2-4 GM/100ML-% IV SOLN
INTRAVENOUS | Status: AC
  Filled 2019-08-07: qty 100

## 2019-08-07 MED ORDER — LIDOCAINE HCL (CARDIAC) PF 100 MG/5ML IV SOSY
PREFILLED_SYRINGE | INTRAVENOUS | Status: DC | PRN
  Administered 2019-08-07: 60 mg via INTRAVENOUS

## 2019-08-07 MED ORDER — FENTANYL CITRATE (PF) 100 MCG/2ML IJ SOLN: 25.0000 ug | INTRAMUSCULAR | Status: DC | PRN

## 2019-08-07 MED ORDER — CELECOXIB 200 MG PO CAPS
200.0000 mg | ORAL_CAPSULE | ORAL | Status: AC
  Administered 2019-08-07: 200 mg via ORAL

## 2019-08-07 MED ORDER — DEXAMETHASONE SODIUM PHOSPHATE 4 MG/ML IJ SOLN
INTRAMUSCULAR | Status: DC | PRN
  Administered 2019-08-07: 4 mg via INTRAVENOUS

## 2019-08-07 MED ORDER — BUPIVACAINE-EPINEPHRINE (PF) 0.25% -1:200000 IJ SOLN
INTRAMUSCULAR | Status: DC | PRN
  Administered 2019-08-07 (×2): 10 mL

## 2019-08-07 MED ORDER — ONDANSETRON HCL 4 MG/2ML IJ SOLN
INTRAMUSCULAR | Status: DC | PRN
  Administered 2019-08-07: 4 mg via INTRAVENOUS

## 2019-08-07 MED ORDER — EPHEDRINE SULFATE 50 MG/ML IJ SOLN
INTRAMUSCULAR | Status: DC | PRN
  Administered 2019-08-07: 10 mg via INTRAVENOUS

## 2019-08-07 SURGICAL SUPPLY — 54 items
ADH SKN CLS APL DERMABOND .7 (GAUZE/BANDAGES/DRESSINGS) ×1
APL PRP STRL LF DISP 70% ISPRP (MISCELLANEOUS) ×1
APPLIER CLIP 9.375 MED OPEN (MISCELLANEOUS) ×3
APR CLP MED 9.3 20 MLT OPN (MISCELLANEOUS) ×1
BINDER BREAST LRG (GAUZE/BANDAGES/DRESSINGS) IMPLANT
BINDER BREAST MEDIUM (GAUZE/BANDAGES/DRESSINGS) IMPLANT
BINDER BREAST XLRG (GAUZE/BANDAGES/DRESSINGS) ×2 IMPLANT
BINDER BREAST XXLRG (GAUZE/BANDAGES/DRESSINGS) IMPLANT
BLADE SURG 15 STRL LF DISP TIS (BLADE) ×1 IMPLANT
BLADE SURG 15 STRL SS (BLADE) ×3
CANISTER SUC SOCK COL 7IN (MISCELLANEOUS) IMPLANT
CANISTER SUCT 1200ML W/VALVE (MISCELLANEOUS) IMPLANT
CHLORAPREP W/TINT 26 (MISCELLANEOUS) ×3 IMPLANT
CLIP APPLIE 9.375 MED OPEN (MISCELLANEOUS) IMPLANT
COVER BACK TABLE 60X90IN (DRAPES) ×3 IMPLANT
COVER MAYO STAND STRL (DRAPES) ×3 IMPLANT
COVER PROBE W GEL 5X96 (DRAPES) ×3 IMPLANT
COVER WAND RF STERILE (DRAPES) IMPLANT
DECANTER SPIKE VIAL GLASS SM (MISCELLANEOUS) IMPLANT
DERMABOND ADVANCED (GAUZE/BANDAGES/DRESSINGS) ×2
DERMABOND ADVANCED .7 DNX12 (GAUZE/BANDAGES/DRESSINGS) ×1 IMPLANT
DRAPE LAPAROSCOPIC ABDOMINAL (DRAPES) IMPLANT
DRAPE LAPAROTOMY 100X72 PEDS (DRAPES) ×5 IMPLANT
DRAPE UTILITY XL STRL (DRAPES) ×3 IMPLANT
ELECT COATED BLADE 2.86 ST (ELECTRODE) ×3 IMPLANT
ELECT REM PT RETURN 9FT ADLT (ELECTROSURGICAL) ×3
ELECTRODE REM PT RTRN 9FT ADLT (ELECTROSURGICAL) ×1 IMPLANT
GLOVE BIOGEL PI IND STRL 7.0 (GLOVE) IMPLANT
GLOVE BIOGEL PI IND STRL 8 (GLOVE) ×1 IMPLANT
GLOVE BIOGEL PI INDICATOR 7.0 (GLOVE) ×4
GLOVE BIOGEL PI INDICATOR 8 (GLOVE) ×2
GLOVE ECLIPSE 8.0 STRL XLNG CF (GLOVE) ×3 IMPLANT
GLOVE SURG SS PI 7.0 STRL IVOR (GLOVE) ×2 IMPLANT
GOWN STRL REUS W/ TWL LRG LVL3 (GOWN DISPOSABLE) ×2 IMPLANT
GOWN STRL REUS W/TWL LRG LVL3 (GOWN DISPOSABLE) ×6
HEMOSTAT ARISTA ABSORB 3G PWDR (HEMOSTASIS) IMPLANT
HEMOSTAT SNOW SURGICEL 2X4 (HEMOSTASIS) IMPLANT
KIT MARKER MARGIN INK (KITS) ×3 IMPLANT
NDL HYPO 25X1 1.5 SAFETY (NEEDLE) ×1 IMPLANT
NEEDLE HYPO 25X1 1.5 SAFETY (NEEDLE) ×3 IMPLANT
NS IRRIG 1000ML POUR BTL (IV SOLUTION) ×3 IMPLANT
PENCIL SMOKE EVACUATOR (MISCELLANEOUS) ×3 IMPLANT
SET BASIN DAY SURGERY F.S. (CUSTOM PROCEDURE TRAY) ×3 IMPLANT
SLEEVE SCD COMPRESS KNEE MED (MISCELLANEOUS) ×3 IMPLANT
SPONGE LAP 4X18 RFD (DISPOSABLE) ×3 IMPLANT
SUT MNCRL AB 4-0 PS2 18 (SUTURE) ×3 IMPLANT
SUT SILK 2 0 SH (SUTURE) IMPLANT
SUT VICRYL 3-0 CR8 SH (SUTURE) ×3 IMPLANT
SYR CONTROL 10ML LL (SYRINGE) ×3 IMPLANT
TOWEL GREEN STERILE FF (TOWEL DISPOSABLE) ×3 IMPLANT
TRAY FAXITRON CT DISP (TRAY / TRAY PROCEDURE) ×3 IMPLANT
TUBE CONNECTING 20'X1/4 (TUBING)
TUBE CONNECTING 20X1/4 (TUBING) IMPLANT
YANKAUER SUCT BULB TIP NO VENT (SUCTIONS) IMPLANT

## 2019-08-07 NOTE — Anesthesia Postprocedure Evaluation (Signed)
Anesthesia Post Note  Patient: Tami Wood  Procedure(s) Performed: RIGHT BREAST LUMPECTOMY WITH RADIOACTIVE SEED LOCALIZATION (Right Breast)     Patient location during evaluation: PACU Anesthesia Type: General Level of consciousness: awake and alert Pain management: pain level controlled Vital Signs Assessment: post-procedure vital signs reviewed and stable Respiratory status: spontaneous breathing, nonlabored ventilation, respiratory function stable and patient connected to nasal cannula oxygen Cardiovascular status: blood pressure returned to baseline and stable Postop Assessment: no apparent nausea or vomiting Anesthetic complications: no    Last Vitals:  Vitals:   08/07/19 1250 08/07/19 1340  BP:  112/69  Pulse: 71 72  Resp: 11 14  Temp:  (!) 36.4 C  SpO2: 100% 100%    Last Pain:  Vitals:   08/07/19 1340  TempSrc:   PainSc: 0-No pain                 Shaneal Barasch COKER

## 2019-08-07 NOTE — Anesthesia Procedure Notes (Addendum)
Procedure Name: LMA Insertion Date/Time: 08/07/2019 11:34 AM Performed by: Signe Colt, CRNA Pre-anesthesia Checklist: Patient identified, Emergency Drugs available, Suction available and Patient being monitored Patient Re-evaluated:Patient Re-evaluated prior to induction Oxygen Delivery Method: Circle system utilized Preoxygenation: Pre-oxygenation with 100% oxygen Induction Type: IV induction Ventilation: Mask ventilation without difficulty LMA: LMA inserted LMA Size: 4.0 Number of attempts: 1 Airway Equipment and Method: Bite block Placement Confirmation: positive ETCO2 Tube secured with: Tape Dental Injury: Teeth and Oropharynx as per pre-operative assessment

## 2019-08-07 NOTE — Interval H&P Note (Signed)
History and Physical Interval Note:  08/07/2019 11:21 AM  Tami Wood  has presented today for surgery, with the diagnosis of RIGHT BREAST DUCTAL CARCINOMA IN SITU.  The various methods of treatment have been discussed with the patient and family. After consideration of risks, benefits and other options for treatment, the patient has consented to  Procedure(s): RIGHT BREAST LUMPECTOMY WITH RADIOACTIVE SEED LOCALIZATION (Right) as a surgical intervention.  The patient's history has been reviewed, patient examined, no change in status, stable for surgery.  I have reviewed the patient's chart and labs.  Questions were answered to the patient's satisfaction.     [place]

## 2019-08-07 NOTE — Transfer of Care (Signed)
Immediate Anesthesia Transfer of Care Note  Patient: Tami Wood  Procedure(s) Performed: RIGHT BREAST LUMPECTOMY WITH RADIOACTIVE SEED LOCALIZATION (Right Breast)  Patient Location: PACU  Anesthesia Type:General  Level of Consciousness: drowsy and patient cooperative  Airway & Oxygen Therapy: Patient Spontanous Breathing and Patient connected to face mask oxygen  Post-op Assessment: Report given to RN and Post -op Vital signs reviewed and stable  Post vital signs: Reviewed and stable  Last Vitals:  Vitals Value Taken Time  BP    Temp    Pulse 74 08/07/19 1229  Resp 24 08/07/19 1229  SpO2 100 % 08/07/19 1229  Vitals shown include unvalidated device data.  Last Pain:  Vitals:   08/07/19 1023  TempSrc: Oral  PainSc: 0-No pain         Complications: No apparent anesthesia complications

## 2019-08-07 NOTE — Discharge Instructions (Signed)
[place] Office Phone Number [phone]  BREAST BIOPSY/ PARTIAL MASTECTOMY: POST OP INSTRUCTIONS  Always review your discharge instruction sheet given to you by the facility where your surgery was performed.  IF YOU HAVE DISABILITY OR FAMILY LEAVE FORMS, YOU MUST BRING THEM TO THE OFFICE FOR PROCESSING.  DO NOT GIVE THEM TO YOUR DOCTOR.  1. A prescription for pain medication may be given to you upon discharge.  Take your pain medication as prescribed, if needed.  If narcotic pain medicine is not needed, then you may take acetaminophen (Tylenol) or ibuprofen (Advil) as needed. 2. Take your usually prescribed medications unless otherwise directed 3. If you need a refill on your pain medication, please contact your pharmacy.  They will contact our office to request authorization.  Prescriptions will not be filled after 5pm or on week-ends. 4. You should eat very light the first 24 hours after surgery, such as soup, crackers, pudding, etc.  Resume your normal diet the day after surgery. 5. Most patients will experience some swelling and bruising in the breast.  Ice packs and a good support bra will help.  Swelling and bruising can take several days to resolve.  6. It is common to experience some constipation if taking pain medication after surgery.  Increasing fluid intake and taking a stool softener will usually help or prevent this problem from occurring.  A mild laxative (Milk of Magnesia or Miralax) should be taken according to package directions if there are no bowel movements after 48 hours. 7. Unless discharge instructions indicate otherwise, you may remove your bandages 24-48 hours after surgery, and you may shower at that time.  You may have steri-strips (small skin tapes) in place directly over the incision.  These strips should be left on the skin for 7-10 days.  If your surgeon used skin glue on the incision, you may shower in 24 hours.  The glue will flake off over the  next 2-3 weeks.  Any sutures or staples will be removed at the office during your follow-up visit. 8. ACTIVITIES:  You may resume regular daily activities (gradually increasing) beginning the next day.  Wearing a good support bra or sports bra minimizes pain and swelling.  You may have sexual intercourse when it is comfortable. a. You may drive when you no longer are taking prescription pain medication, you can comfortably wear a seatbelt, and you can safely maneuver your car and apply brakes. b. RETURN TO WORK:  ______________________________________________________________________________________ 9. You should see your doctor in the office for a follow-up appointment approximately two weeks after your surgery.  Your doctor's nurse will typically make your follow-up appointment when she calls you with your pathology report.  Expect your pathology report 2-3 business days after your surgery.  You may call to check if you do not hear from [place] after three days. 10. OTHER INSTRUCTIONS: _______________________________________________________________________________________________ _____________________________________________________________________________________________________________________________________ _____________________________________________________________________________________________________________________________________ _____________________________________________________________________________________________________________________________________  WHEN TO CALL YOUR DOCTOR: 1. Fever over 101.0 2. Nausea and/or vomiting. 3. Extreme swelling or bruising. 4. Continued bleeding from incision. 5. Increased pain, redness, or drainage from the incision.  The clinic staff is available to answer your questions during regular business hours.  Please don't hesitate to call and ask to speak to one of the nurses for clinical concerns.  If you have a medical emergency, go to the nearest  emergency room or call 911.  A surgeon from [facility] is always on call at the hospital.  For further questions, please visit [email or web address]  Post Anesthesia Home Care Instructions  Activity: Get plenty of rest for the remainder of the day. A responsible individual must stay with you for 24 hours following the procedure.  For the next 24 hours, DO NOT: -Drive a car -Paediatric nurse -Drink alcoholic beverages -Take any medication unless instructed by your physician -Make any legal decisions or sign important papers.  Meals: Start with liquid foods such as gelatin or soup. Progress to regular foods as tolerated. Avoid greasy, spicy, heavy foods. If nausea and/or vomiting occur, drink only clear liquids until the nausea and/or vomiting subsides. Call your physician if vomiting continues.  Special Instructions/Symptoms: Your throat may feel dry or sore from the anesthesia or the breathing tube placed in your throat during surgery. If this causes discomfort, gargle with warm salt water. The discomfort should disappear within 24 hours.   *May have Tylenol at 4:30pm *May have Ibuprofen at 6:30pm

## 2019-08-07 NOTE — Op Note (Signed)
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  Surgical options were reviewed as well as mastectomy with reconstruction.  She also medical and radiation oncology and opted for breast conserving surgery.The procedure has been discussed with the patient. Alternatives to surgery have been discussed with the patient.  Risks of surgery include bleeding,  Infection,  Seroma formation, death,  and the need for further surgery.   The patient understands and wishes to proceed.   Description of procedure: The patient was met in the holding area and questions were answered.  The right breast was marked as the correct side and neoprobe used to verify seed location.  All questions were answered.  She was taken back to the operative room.  She is placed supine upon the OR table.  After induction of general anesthesia, right breast was prepped and draped in sterile fashion.  Timeout performed.  Neoprobe used to verify seed location right breast lower outer quadrant.  Curvilinear incision was made after infiltration of the skin with local anesthetic.  All tissue and the seed clip were excised with a grossly negative margin.  Specimen revealed the seed and clip to be in specimen.  The specimen was oriented.  Clips used to mark the cavity and the cavity is closed with 3-0 Vicryl for Monocryl.  Dermabond applied.  Breast binder placed.  All counts were found to be correct.  The patient was awoke extubated taken recovery in satisfactory condition.

## 2019-08-07 NOTE — Anesthesia Procedure Notes (Signed)
Performed by: Kao Berkheimer D, CRNA       

## 2019-08-07 NOTE — Anesthesia Preprocedure Evaluation (Signed)
Anesthesia Evaluation  Patient identified by MRN, date of birth, ID band Patient awake    Reviewed: Allergy & Precautions, NPO status , Patient's Chart, lab work & pertinent test results  Airway Mallampati: II  TM Distance: >3 FB Neck ROM: Full    Dental  (+) Teeth Intact, Dental Advisory Given   Pulmonary    breath sounds clear to auscultation       Cardiovascular hypertension,  Rhythm:Regular Rate:Normal     Neuro/Psych    GI/Hepatic   Endo/Other    Renal/GU      Musculoskeletal   Abdominal   Peds  Hematology   Anesthesia Other Findings   Reproductive/Obstetrics                             Anesthesia Physical Anesthesia Plan  ASA: III  Anesthesia Plan: General   Post-op Pain Management:    Induction: Intravenous  PONV Risk Score and Plan: Ondansetron and Dexamethasone  Airway Management Planned: LMA  Additional Equipment:   Intra-op Plan:   Post-operative Plan:   Informed Consent: I have reviewed the patients History and Physical, chart, labs and discussed the procedure including the risks, benefits and alternatives for the proposed anesthesia with the patient or authorized representative who has indicated his/her understanding and acceptance.   Dental advisory given  Plan Discussed with: CRNA and Anesthesiologist  Anesthesia Plan Comments:         Anesthesia Quick Evaluation  

## 2019-08-08 ENCOUNTER — Encounter: Payer: Self-pay | Admitting: *Deleted

## 2019-08-08 LAB — SURGICAL PATHOLOGY

## 2019-08-12 ENCOUNTER — Encounter: Payer: Self-pay | Admitting: *Deleted

## 2019-08-23 NOTE — Progress Notes (Signed)
Patient here for a consult with Dr. Sondra Come today.  CHIEF COMPLAINT: Ductal carcinoma in situ, estrogen receptor positive  CURRENT TREATMENT: 08/07/2019 Right Breast Lumpectomy  Clinical History: Right breast Ductal Carcinoma In Situ (nt)   FINAL MICROSCOPIC DIAGNOSIS:   A. BREAST, RIGHT, LUMPECTOMY:  - Biopsy site. Fibrocystic change. Fibroadenoma. Adenosis.  Microcalcifications are associated with these processes.  - No residual ductal carcinoma in situ identified.   HISTORY OF CURRENT ILLNESS: Tami Wood had routine screening mammography on 12/18/2018 showing 0.7 cm probably-benign calcifications in the lower-outer right breast, 6 month follow up was recommended. She underwent right diagnostic mammography with tomography at [facility] on 06/18/2019 showing: breast density category C; 0.7 cm grouped calcifications in the lower-outer right breast.  Accordingly on 07/01/2019 she proceeded to biopsy of the right breast area in question. The pathology from this procedure ([id number]) showed: ductal carcinoma in situ with central necrosis and calcifications, intermediate grade. Prognostic indicators significant for: estrogen receptor, 90% positive and progesterone receptor, 5% positive, both with strong staining intensity.     Past/Anticipated interventions by medical oncology, if any: not sure  Lymphedema issues, if any: no  Pain issues, if any: no  SAFETY ISSUES:  Prior radiation? no  Pacemaker/ICD? no  Possible current pregnancy? Hysterectomy  Is the patient on methotrexate?  BP (!) 146/86 (BP Location: Left Arm, Patient Position: Sitting)   Pulse 60   Temp 97.6 F (36.4 C) (Temporal)   Resp 18   Ht 5' 5.5" (1.664 m)   Wt 175 lb 4 oz (79.5 kg)   SpO2 100%   BMI 28.72 kg/m    Wt Readings from Last 3 Encounters:  08/28/19 175 lb 4 oz (79.5 kg)  08/07/19 180 lb 1.9 oz (81.7 kg)  07/10/19 179 lb (81.2 kg)      De Burrs, RN 08/23/2019,12:42 PM

## 2019-08-28 ENCOUNTER — Ambulatory Visit
Admission: RE | Admit: 2019-08-28 | Discharge: 2019-08-28 | Disposition: A | Discharge status: Home / Self Care | Payer: Medicare Other | Source: Ambulatory Visit | Attending: Radiation Oncology | Admitting: Radiation Oncology

## 2019-08-28 ENCOUNTER — Encounter: Payer: Self-pay | Admitting: *Deleted

## 2019-08-28 ENCOUNTER — Encounter: Payer: Self-pay | Admitting: Radiation Oncology

## 2019-08-28 ENCOUNTER — Other Ambulatory Visit: Payer: Self-pay

## 2019-08-28 DIAGNOSIS — Z7982 Long term (current) use of aspirin: Secondary | ICD-10-CM | POA: Insufficient documentation

## 2019-08-28 DIAGNOSIS — D0511 Intraductal carcinoma in situ of right breast: Secondary | ICD-10-CM

## 2019-08-28 DIAGNOSIS — D0512 Intraductal carcinoma in situ of left breast: Secondary | ICD-10-CM | POA: Insufficient documentation

## 2019-08-28 DIAGNOSIS — Z923 Personal history of irradiation: Secondary | ICD-10-CM | POA: Diagnosis not present

## 2019-08-28 DIAGNOSIS — Z170 Estrogen receptor positive status [ER+]: Secondary | ICD-10-CM | POA: Diagnosis not present

## 2019-08-28 DIAGNOSIS — Z510 Encounter for antineoplastic radiation therapy: Secondary | ICD-10-CM | POA: Diagnosis not present

## 2019-08-28 DIAGNOSIS — Z9889 Other specified postprocedural states: Secondary | ICD-10-CM | POA: Diagnosis not present

## 2019-08-28 DIAGNOSIS — Z79899 Other long term (current) drug therapy: Secondary | ICD-10-CM | POA: Diagnosis not present

## 2019-08-28 NOTE — Progress Notes (Signed)
Radiation Oncology         [phone] ________________________________  Name: Tami Wood MRN: [id number]  Date: 08/28/2019  DOB: November 21, 1950  Re-Evaluation Note  CC: Biagio Borg, MD  Biagio Borg, MD    ICD-10-CM   1. Ductal carcinoma in situ (DCIS) of right breast  D05.11     Diagnosis: Stage 0 Right Breast LOQ, DCIS, ER+ / PR+, Grade 2  Narrative:  The patient returns today to discuss radiation treatment options. She was seen in the multidisciplinary breast clinic on 07/10/2019. At that time, it was recommended that the patient proceed with right lumpectomy, possible adjuvant radiation therapy, and aromatase inhibitor.  The patient proceeded with a right breast lumpectomy on 08/07/2019 performed by Dr. Brantley Stage. Pathology from the procedure revealed fibrocystic change, fibroadenoma, adenosis, and associated micro-calcifications of the right breast. There was no residual ductal carcinoma in-situ identified.  On review of systems, the patient reports no complaints. She denies breast pain, lymphedema, and any other symptoms.    Allergies:  has No Known Allergies.  Meds: Current Outpatient Medications  Medication Sig Dispense Refill   aspirin 81 MG EC tablet Take 81 mg by mouth daily.       atorvastatin (LIPITOR) 80 MG tablet TAKE 1 TABLET BY MOUTH DAILY AT 6 PM 90 tablet 3   cetirizine (ZYRTEC) 10 MG tablet Take 1 tablet (10 mg total) by mouth daily. 30 tablet 11   metoprolol succinate (TOPROL XL) 50 MG 24 hr tablet Take 1 tablet (50 mg total) by mouth daily. 90 tablet 3   sacubitril-valsartan (ENTRESTO) 49-51 MG Take 1 tablet by mouth 2 (two) times daily. (Patient taking differently: Take 2 tablets by mouth 2 (two) times daily. ) 120 tablet 0   sodium chloride (OCEAN) 0.65 % SOLN nasal spray Place 1 spray into both nostrils as needed for congestion.     furosemide (LASIX) 20 MG tablet Take 1 tablet (20 mg total) by mouth daily as needed. 30 tablet 2   oxyCODONE (OXY  IR/ROXICODONE) 5 MG immediate release tablet Take 1 tablet (5 mg total) by mouth every 6 (six) hours as needed for severe pain. (Patient not taking: Reported on 08/28/2019) 15 tablet 0   No current facility-administered medications for this encounter.    Physical Findings: The patient is in no acute distress. Patient is alert and oriented.  height is 5' 5.5" (1.664 m) and weight is 175 lb 4 oz (79.5 kg). Her temporal temperature is 97.6 F (36.4 C). Her blood pressure is 146/86 (abnormal) and her pulse is 60. Her respiration is 18 and oxygen saturation is 100%.  No significant changes. Lungs are clear to auscultation bilaterally. Heart has regular rate and rhythm. No palpable cervical, supraclavicular, or axillary adenopathy. Abdomen soft, non-tender, normal bowel sounds. Left breast: no palpable mass, nipple discharge or bleeding. Right Breast: The patient has a well-healing scar in the lower outer aspect of the right breast.  No dominant mass appreciated in the breast nipple discharge or bleeding  Lab Findings: Lab Results  Component Value Date   WBC 3.6 (L) 07/10/2019   HGB 11.9 (L) 07/10/2019   HCT 36.9 07/10/2019   MCV 93.9 07/10/2019   PLT 156 07/10/2019    Radiographic Findings: No results found.  Impression: Stage 0 Right Breast LOQ, DCIS, ER+ / PR+, Grade 2  Given the pathologic findings the patient has an excellent prognosis.  She has low risk for recurrence within the breast with or without without radiation  therapy.  Given the patient's relatively young age and wishes to be aggressive she does wish to proceed with adjuvant radiation therapy to reduce the chances for local recurrence.  I discussed the general course of radiation therapy anticipated side effects and potential long-term toxicities toxicities of adjuvant radiation therapy.  Patient appears to understand and wishes to proceed with planned course of treatment.  Plan:  Patient is scheduled for CT simulation later  today.  Treatments to begin next week.  The patient will be a good candidate for hypofractionated accelerated radiation therapy over approximately 3 weeks.  -----------------------------------  Blair Promise, PhD, MD  This document serves as a record of services personally performed by Gery Pray, MD. It was created on his behalf by Clerance Lav, a trained medical scribe. The creation of this record is based on the scribe's personal observations and the provider's statements to them. This document has been checked and approved by the attending provider.

## 2019-08-28 NOTE — Progress Notes (Signed)
  Radiation Oncology         [phone] ________________________________  Name: Tami Wood MRN: [id number]  Date: 08/28/2019  DOB: [birth year]  SIMULATION AND TREATMENT PLANNING NOTE    ICD-10-CM   1. Ductal carcinoma in situ (DCIS) of right breast  D05.11     DIAGNOSIS: Stage 0 Right Breast LOQ, DCIS, ER+ / PR+, Grade 2  NARRATIVE:  The patient was brought to the [facility].  Identity was confirmed.  All relevant records and images related to the planned course of therapy were reviewed.  The patient freely provided informed written consent to proceed with treatment after reviewing the details related to the planned course of therapy. The consent form was witnessed and verified by the simulation staff.  Then, the patient was set-up in a stable reproducible  supine position for radiation therapy.  CT images were obtained.  Surface markings were placed.  The CT images were loaded into the planning software.  Then the target and avoidance structures were contoured.  Treatment planning then occurred.  The radiation prescription was entered and confirmed.  Then, I designed and supervised the construction of a total of 5 medically necessary complex treatment devices.  I have requested : 3D Simulation  I have requested a DVH of the following structures: lumpectomy cavity, lungs, heart.  I have ordered:dose calc.  PLAN:  The patient will receive 42.72 Gy in 16 fractions. No boost planned since lumpectomy cavity showed no further DCIS.   Optical Surface Tracking Plan:  Since intensity modulated radiotherapy (IMRT) and 3D conformal radiation treatment methods are predicated on accurate and precise positioning for treatment, intrafraction motion monitoring is medically necessary to ensure accurate and safe treatment delivery.  The ability to quantify intrafraction motion without excessive ionizing radiation dose can only be performed with optical surface tracking. Accordingly, surface  imaging offers the opportunity to obtain 3D measurements of patient position throughout IMRT and 3D treatments without excessive radiation exposure.  I am ordering optical surface tracking for this patient's upcoming course of radiotherapy. ________________________________    Blair Promise, PhD, MD  This document serves as a record of services personally performed by Gery Pray, MD. It was created on his behalf by Clerance Lav, a trained medical scribe. The creation of this record is based on the scribe's personal observations and the provider's statements to them. This document has been checked and approved by the attending provider.

## 2019-08-29 ENCOUNTER — Encounter: Payer: Self-pay | Admitting: Licensed Clinical Social Worker

## 2019-08-29 NOTE — Progress Notes (Signed)
[place] Psychosocial Distress Screening Clinical Social Work  Clinical Social Work was referred by distress screening protocol.  The patient scored a 5 on the Psychosocial Distress Thermometer which indicates moderate distress. Clinical Social Worker attempted to contact by phone to assess for distress and other psychosocial needs.  No answer, LVM with direct contact information.  ONCBCN DISTRESS SCREENING 08/28/2019  Screening Type Initial Screening  Distress experienced in past week (1-10) 5  Referral to clinical social work No      Daira Hine, [place], LCSW

## 2019-09-02 DIAGNOSIS — D0511 Intraductal carcinoma in situ of right breast: Secondary | ICD-10-CM | POA: Diagnosis not present

## 2019-09-02 DIAGNOSIS — Z510 Encounter for antineoplastic radiation therapy: Secondary | ICD-10-CM | POA: Diagnosis not present

## 2019-09-03 NOTE — Progress Notes (Signed)
  Radiation Oncology         [phone] ________________________________  Name: Tami Wood MRN: [id number]  Date: 09/04/2019  DOB: 12/23/1950  Simulation Verification Note    ICD-10-CM   1. Ductal carcinoma in situ (DCIS) of right breast  D05.11     NARRATIVE: The patient was brought to the treatment unit and placed in the planned treatment position. The clinical setup was verified. Then port films were obtained and uploaded to the radiation oncology medical record software.  The treatment beams were carefully compared against the planned radiation fields. The position location and shape of the radiation fields was reviewed. They targeted volume of tissue appears to be appropriately covered by the radiation beams. Organs at risk appear to be excluded as planned.  Based on my personal review, I approved the simulation verification. The patient's treatment will proceed as planned.  -----------------------------------  Blair Promise, PhD, MD  This document serves as a record of services personally performed by Gery Pray, MD. It was created on his behalf by Clerance Lav, a trained medical scribe. The creation of this record is based on the scribe's personal observations and the provider's statements to them. This document has been checked and approved by the attending provider.

## 2019-09-04 ENCOUNTER — Ambulatory Visit
Admission: RE | Admit: 2019-09-04 | Discharge: 2019-09-04 | Disposition: A | Discharge status: Home / Self Care | Payer: Medicare Other | Source: Ambulatory Visit | Attending: Radiation Oncology | Admitting: Radiation Oncology

## 2019-09-04 ENCOUNTER — Other Ambulatory Visit: Payer: Self-pay

## 2019-09-04 DIAGNOSIS — Z510 Encounter for antineoplastic radiation therapy: Secondary | ICD-10-CM | POA: Diagnosis not present

## 2019-09-04 DIAGNOSIS — D0511 Intraductal carcinoma in situ of right breast: Secondary | ICD-10-CM | POA: Diagnosis not present

## 2019-09-05 ENCOUNTER — Other Ambulatory Visit: Payer: Self-pay

## 2019-09-05 ENCOUNTER — Ambulatory Visit
Admission: RE | Admit: 2019-09-05 | Discharge: 2019-09-05 | Disposition: A | Discharge status: Home / Self Care | Payer: Medicare Other | Source: Ambulatory Visit | Attending: Radiation Oncology | Admitting: Radiation Oncology

## 2019-09-05 DIAGNOSIS — Z510 Encounter for antineoplastic radiation therapy: Secondary | ICD-10-CM | POA: Diagnosis not present

## 2019-09-05 DIAGNOSIS — D0511 Intraductal carcinoma in situ of right breast: Secondary | ICD-10-CM | POA: Diagnosis not present

## 2019-09-06 ENCOUNTER — Ambulatory Visit
Admission: RE | Admit: 2019-09-06 | Discharge: 2019-09-06 | Disposition: A | Discharge status: Home / Self Care | Payer: Medicare Other | Source: Ambulatory Visit | Attending: Radiation Oncology | Admitting: Radiation Oncology

## 2019-09-06 ENCOUNTER — Other Ambulatory Visit: Payer: Self-pay

## 2019-09-06 DIAGNOSIS — D0511 Intraductal carcinoma in situ of right breast: Secondary | ICD-10-CM | POA: Diagnosis not present

## 2019-09-06 DIAGNOSIS — Z510 Encounter for antineoplastic radiation therapy: Secondary | ICD-10-CM | POA: Diagnosis not present

## 2019-09-09 ENCOUNTER — Ambulatory Visit
Admission: RE | Admit: 2019-09-09 | Discharge: 2019-09-09 | Disposition: A | Discharge status: Home / Self Care | Payer: Medicare Other | Source: Ambulatory Visit | Attending: Radiation Oncology | Admitting: Radiation Oncology

## 2019-09-09 ENCOUNTER — Other Ambulatory Visit: Payer: Self-pay

## 2019-09-09 DIAGNOSIS — Z510 Encounter for antineoplastic radiation therapy: Secondary | ICD-10-CM | POA: Diagnosis not present

## 2019-09-09 DIAGNOSIS — D0511 Intraductal carcinoma in situ of right breast: Secondary | ICD-10-CM | POA: Diagnosis not present

## 2019-09-10 ENCOUNTER — Other Ambulatory Visit: Payer: Self-pay

## 2019-09-10 ENCOUNTER — Ambulatory Visit
Admission: RE | Admit: 2019-09-10 | Discharge: 2019-09-10 | Disposition: A | Discharge status: Home / Self Care | Payer: Medicare Other | Source: Ambulatory Visit | Attending: Radiation Oncology | Admitting: Radiation Oncology

## 2019-09-10 DIAGNOSIS — D0511 Intraductal carcinoma in situ of right breast: Secondary | ICD-10-CM

## 2019-09-10 DIAGNOSIS — Z510 Encounter for antineoplastic radiation therapy: Secondary | ICD-10-CM | POA: Diagnosis not present

## 2019-09-10 MED ORDER — SONAFINE EX EMUL
1.0000 "application " | Freq: Two times a day (BID) | CUTANEOUS | Status: DC
  Administered 2019-09-10: 1 via TOPICAL

## 2019-09-10 MED ORDER — ALRA NON-METALLIC DEODORANT (RAD-ONC)
1.0000 "application " | Freq: Once | TOPICAL | Status: AC
  Administered 2019-09-10: 1 via TOPICAL

## 2019-09-11 ENCOUNTER — Ambulatory Visit
Admission: RE | Admit: 2019-09-11 | Discharge: 2019-09-11 | Disposition: A | Discharge status: Home / Self Care | Payer: Medicare Other | Source: Ambulatory Visit | Attending: Radiation Oncology | Admitting: Radiation Oncology

## 2019-09-11 ENCOUNTER — Other Ambulatory Visit: Payer: Self-pay

## 2019-09-11 DIAGNOSIS — D0511 Intraductal carcinoma in situ of right breast: Secondary | ICD-10-CM | POA: Diagnosis not present

## 2019-09-11 DIAGNOSIS — Z510 Encounter for antineoplastic radiation therapy: Secondary | ICD-10-CM | POA: Diagnosis not present

## 2019-09-12 ENCOUNTER — Ambulatory Visit
Admission: RE | Admit: 2019-09-12 | Discharge: 2019-09-12 | Disposition: A | Discharge status: Home / Self Care | Payer: Medicare Other | Source: Ambulatory Visit | Attending: Radiation Oncology | Admitting: Radiation Oncology

## 2019-09-12 ENCOUNTER — Other Ambulatory Visit: Payer: Self-pay

## 2019-09-12 DIAGNOSIS — D0511 Intraductal carcinoma in situ of right breast: Secondary | ICD-10-CM | POA: Diagnosis not present

## 2019-09-12 DIAGNOSIS — Z510 Encounter for antineoplastic radiation therapy: Secondary | ICD-10-CM | POA: Diagnosis not present

## 2019-09-13 ENCOUNTER — Ambulatory Visit
Admission: RE | Admit: 2019-09-13 | Discharge: 2019-09-13 | Disposition: A | Discharge status: Home / Self Care | Payer: Medicare Other | Source: Ambulatory Visit | Attending: Radiation Oncology | Admitting: Radiation Oncology

## 2019-09-13 ENCOUNTER — Other Ambulatory Visit: Payer: Self-pay

## 2019-09-13 DIAGNOSIS — Z510 Encounter for antineoplastic radiation therapy: Secondary | ICD-10-CM | POA: Diagnosis not present

## 2019-09-13 DIAGNOSIS — D0511 Intraductal carcinoma in situ of right breast: Secondary | ICD-10-CM | POA: Diagnosis not present

## 2019-09-16 ENCOUNTER — Other Ambulatory Visit: Payer: Self-pay

## 2019-09-16 ENCOUNTER — Encounter: Payer: Self-pay | Admitting: Cardiology

## 2019-09-16 ENCOUNTER — Ambulatory Visit: Payer: Medicare Other | Admitting: Cardiology

## 2019-09-16 ENCOUNTER — Ambulatory Visit
Admission: RE | Admit: 2019-09-16 | Discharge: 2019-09-16 | Disposition: A | Discharge status: Home / Self Care | Payer: Medicare Other | Source: Ambulatory Visit | Attending: Radiation Oncology | Admitting: Radiation Oncology

## 2019-09-16 VITALS — BP 113/72 | HR 67 | Resp 17 | Ht 65.0 in | Wt 178.0 lb

## 2019-09-16 DIAGNOSIS — I5022 Chronic systolic (congestive) heart failure: Secondary | ICD-10-CM

## 2019-09-16 DIAGNOSIS — I10 Essential (primary) hypertension: Secondary | ICD-10-CM

## 2019-09-16 DIAGNOSIS — Z510 Encounter for antineoplastic radiation therapy: Secondary | ICD-10-CM | POA: Diagnosis not present

## 2019-09-16 DIAGNOSIS — D0511 Intraductal carcinoma in situ of right breast: Secondary | ICD-10-CM | POA: Diagnosis not present

## 2019-09-16 DIAGNOSIS — Z9889 Other specified postprocedural states: Secondary | ICD-10-CM

## 2019-09-16 DIAGNOSIS — I25810 Atherosclerosis of coronary artery bypass graft(s) without angina pectoris: Secondary | ICD-10-CM

## 2019-09-16 NOTE — Progress Notes (Signed)
Follow up visit  Subjective:   Tami Wood, female    DOB: [birth year], 69 y.o.   MRN: [id number]    Chief Complaint  Patient presents with  . Hypertension  . Congestive Heart Failure  . Follow-up    HPI  69 y/o [place] American female with mitral valve prolapse, CAD, now s/p mitral valve repair, CABGX2 (03/2018 by Dr. 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 She underwent Rt lumpetomy and is currently undergoing radiation. She is in good spirits, denies chest pain, shortness of breath, palpitations, leg edema, orthopnea, PND, TIA/syncope. Blood pressure is well controlled.    Current Outpatient Medications on File Prior to Visit  Medication Sig Dispense Refill  . aspirin 81 MG EC tablet Take 81 mg by mouth daily.      Marland Kitchen atorvastatin (LIPITOR) 80 MG tablet TAKE 1 TABLET BY MOUTH DAILY AT 6 PM 90 tablet 3  . cetirizine (ZYRTEC) 10 MG tablet Take 1 tablet (10 mg total) by mouth daily. 30 tablet 11  . furosemide (LASIX) 20 MG tablet Take 1 tablet (20 mg total) by mouth daily as needed. 30 tablet 2  . metoprolol succinate (TOPROL XL) 50 MG 24 hr tablet Take 1 tablet (50 mg total) by mouth daily. 90 tablet 3  . sacubitril-valsartan (ENTRESTO) 49-51 MG Take 1 tablet by mouth 2 (two) times daily. (Patient taking differently: Take 2 tablets by mouth 2 (two) times daily. ) 120 tablet 0  . sodium chloride (OCEAN) 0.65 % SOLN nasal spray Place 1 spray into both nostrils as needed for congestion.     No current facility-administered medications on file prior to visit.    Cardiovascular studies:  06/10/2019: Sinus rhythm 64 bpm. Left atrial enlargement. Nonspecific T wave inversion lateral leads.   Echocardiogram 06/04/2019:  Mildly depressed LV systolic function with visual EF 45-50%. Left  ventricle cavity is normal in size. Mild left ventricular hypertrophy.  Normal global wall motion. Unable  to evaluate diastolic function due to  mitral valve repair. Left atrial cavity is severely dilated. Interatrial septum bulges to the  right suggests elevated left atrial pressure.  Mitral valve ring is well seated and without evidence of dehiscence.  Posterior leaflet is restricted and anterior leaflet is thickened with  appropriate excursion. No evidence of mitral stenosis. At the heart rate  of 54bpm E wave velocity 1.46ms, TVI ratio 3.1, PHT 1258mc, Mean gradient  3.70m31m. Mild mitral regurgitation.  Mild tricuspid regurgitation. No evidence of pulmonary hypertension.  Prior study dated 12/01/2018 noted LVEF 35-40%, moderately dilated LA,  Mild mitral stenosis (PG 11.7mm35m MG 3.9mmH10mcalculated MVA 1.8cm2),  Mild TR.   48 hr Holter monitor 03/15/2019:  Dominant rhythm sinus.  HR 45-98 bpm. Avg HR 68 bpm.  No atrial fibrillation/atrial flutter/SVT/VT/high grade AV block, sinus pause >3sec noted.  Rare PAC/PVC seen.  Symptoms reported: None   EKG 12/10/2018: Sinus rhythm 73 bpm. Anterolateral T wave inversions. Consider ischemia.     Procedure 04/10/2018:  (Dr. Owen)Roxy Mannsitral Valve Repair             Complex valvuloplasty including artificial Gore-tex neochord placement x8             Sorin Memo 4D ring annuloplasty (size 28mm,88malog #[id number], [id number] #[id number] Artery Bypass Grafting x 2              Left Internal  Mammary Artery to Distal Left Anterior Descending Coronary Artery             Saphenous Vein Graft to Posterior Descending Coronary Artery             Endoscopic Vein Harvest from Right Thigh   Carotid US 04/06/2018: Right Carotid: Velocities in the right ICA are consistent with a 1-39% stenosis.  Left Carotid: Velocities in the left ICA are consistent with a 1-39% stenosis. Vertebrals: Left vertebral artery demonstrates antegrade flow. Right vertebral             artery demonstrates bidirectional flow.  Recent labs: 07/10/2019: Glucose 98,  BUN/Cr 9/0.77. EGFR >60. Na/K 140/4.1. AlKp 144, total protein 8.5. Rest of the CMP normal H/H 11.9/36.9. MCV 93. Platelets 156 TSH 0.6 normal (02/2019)  02/05/2019: Glucose 84, BUN/Cr 9/0.66. EGFR 104. Na/K 140/4.2. Rest of the CMP normal H/H 11.7/35.7. MCV 91. Platelets 171 (10/2018) Chol 152, TG 90, HDL 51, LDL 83 (07/2017) TSH normal (03/2018)   Review of Systems  Cardiovascular: Positive for leg swelling. Negative for chest pain, dyspnea on exertion, palpitations and syncope.       Vitals:   09/16/19 1434  BP: 113/72  Pulse: 67  Resp: 17  SpO2: 99%     Objective:   Physical Exam Vitals and nursing note reviewed.  Constitutional:      Appearance: She is well-developed.  Neck:     Vascular: No JVD.  Cardiovascular:     Rate and Rhythm: Normal rate and regular rhythm.     Pulses: Intact distal pulses.     Heart sounds: Normal heart sounds. No murmur heard.   Pulmonary:     Effort: Pulmonary effort is normal.     Breath sounds: Normal breath sounds. No wheezing or rales.            Assessment & Recommendations:   69 y/o [place] American female with mitral valve prolapse, CAD, now s/p mitral valve repair, CABGX2 (03/2018 by Dr. 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  HFrEF: Clinically euvolumic. EF improved to 40-45%. Continue metoprolol succinate to 50 mg daily, increase Entresto to 49-51 mg bid.   S/P mitral valve repair: On Aspirin 81 mg daily.  Essential hypertension: Controlled.  CAD: No angina symptoms. Continue aspirin 81 mg daily, lipitor 80 mg daily.   F/u in 3 months  [place], MD [facility] Pager: [phone] Office: [phone] If no answer Cell [phone]

## 2019-09-17 ENCOUNTER — Ambulatory Visit
Admission: RE | Admit: 2019-09-17 | Discharge: 2019-09-17 | Disposition: A | Discharge status: Home / Self Care | Payer: Medicare Other | Source: Ambulatory Visit | Attending: Radiation Oncology | Admitting: Radiation Oncology

## 2019-09-17 ENCOUNTER — Other Ambulatory Visit: Payer: Self-pay

## 2019-09-17 DIAGNOSIS — Z510 Encounter for antineoplastic radiation therapy: Secondary | ICD-10-CM | POA: Diagnosis not present

## 2019-09-17 DIAGNOSIS — D0511 Intraductal carcinoma in situ of right breast: Secondary | ICD-10-CM | POA: Diagnosis not present

## 2019-09-18 ENCOUNTER — Other Ambulatory Visit: Payer: Self-pay

## 2019-09-18 ENCOUNTER — Ambulatory Visit
Admission: RE | Admit: 2019-09-18 | Discharge: 2019-09-18 | Disposition: A | Discharge status: Home / Self Care | Payer: Medicare Other | Source: Ambulatory Visit | Attending: Radiation Oncology | Admitting: Radiation Oncology

## 2019-09-18 DIAGNOSIS — Z510 Encounter for antineoplastic radiation therapy: Secondary | ICD-10-CM | POA: Diagnosis not present

## 2019-09-18 DIAGNOSIS — D0511 Intraductal carcinoma in situ of right breast: Secondary | ICD-10-CM | POA: Diagnosis not present

## 2019-09-19 ENCOUNTER — Inpatient Hospital Stay: Payer: Medicare Other | Attending: Oncology | Admitting: Oncology

## 2019-09-19 ENCOUNTER — Ambulatory Visit
Admission: RE | Admit: 2019-09-19 | Discharge: 2019-09-19 | Disposition: A | Discharge status: Home / Self Care | Payer: Medicare Other | Source: Ambulatory Visit | Attending: Radiation Oncology | Admitting: Radiation Oncology

## 2019-09-19 VITALS — BP 130/72 | HR 64 | Temp 98.7°F | Ht 65.0 in | Wt 179.5 lb

## 2019-09-19 DIAGNOSIS — Z170 Estrogen receptor positive status [ER+]: Secondary | ICD-10-CM | POA: Insufficient documentation

## 2019-09-19 DIAGNOSIS — I25810 Atherosclerosis of coronary artery bypass graft(s) without angina pectoris: Secondary | ICD-10-CM | POA: Diagnosis not present

## 2019-09-19 DIAGNOSIS — Z510 Encounter for antineoplastic radiation therapy: Secondary | ICD-10-CM | POA: Diagnosis not present

## 2019-09-19 DIAGNOSIS — D0511 Intraductal carcinoma in situ of right breast: Secondary | ICD-10-CM | POA: Diagnosis not present

## 2019-09-19 NOTE — Progress Notes (Signed)
[place]  [phone] [phone]     ID: Tami Wood DOB: [birth year]  MR#: [id number]  OVF#:[id number]  Patient Care Team: Biagio Borg, MD as PCP - General (Internal Medicine) Mauro Kaufmann, RN as Oncology Nurse Navigator Rockwell Germany, RN as Oncology Nurse Navigator Erroll Luna, MD as Consulting Physician (General Surgery) Shloime Keilman, Virgie Dad, MD as Consulting Physician (Oncology) Gery Pray, MD as Consulting Physician (Radiation Oncology) Nigel Mormon, MD as Consulting Physician (Cardiology) Rexene Alberts, MD as Consulting Physician (Cardiothoracic Surgery) Rozetta Nunnery, MD as Consulting Physician (Otolaryngology) Chauncey Cruel, MD OTHER MD:  CHIEF COMPLAINT: Ductal carcinoma in situ, estrogen receptor positive  CURRENT TREATMENT: adjuvant radiation therapy   INTERVAL HISTORY: Tami Wood returns today for follow up of her noninvasive breast cancer. She was evaluated in the multidisciplinary breast cancer clinic on 07/10/2019.  She opted to proceed with right lumpectomy on 08/07/2019 under Dr. Brantley Stage. Pathology from the procedure ([id number]) showed no residual ductal carcinoma in situ.  She was referred back to Dr. Sondra Come on 08/28/2019 to discuss radiation therapy. She started treatment on 09/04/2019 and is scheduled to finish on 09/25/2019.   REVIEW OF SYSTEMS: Tami Wood has done very well with radiation.  She had minimal desquamation in the inframammary fold initially but that has resolved.  She is using creams intensively and that is helping.  She is having some hyperpigmentation but no significant fatigue.  Detailed review of systems today was otherwise stable.   HISTORY OF CURRENT ILLNESS: From the original intake note:  Tami Wood had routine screening mammography on 12/18/2018 showing 0.7 cm probably-benign calcifications in the lower-outer right breast, 6 month follow up was recommended. She  underwent right diagnostic mammography with tomography at [facility] on 06/18/2019 showing: breast density category C; 0.7 cm grouped calcifications in the lower-outer right breast.  Accordingly on 07/01/2019 she proceeded to biopsy of the right breast area in question. The pathology from this procedure ([id number]) showed: ductal carcinoma in situ with central necrosis and calcifications, intermediate grade. Prognostic indicators significant for: estrogen receptor, 90% positive and progesterone receptor, 5% positive, both with strong staining intensity.   The patient's subsequent history is as detailed below.   PAST MEDICAL HISTORY: Past Medical History:  Diagnosis Date   ALLERGIC RHINITIS 06/18/2009   ANEMIA-IRON DEFICIENCY 11/28/2006   ANXIETY 11/28/2006   Arthritis    Coronary artery disease    Cough 06/18/2009   DEPRESSION 11/28/2006   GERD 12/10/2007   History of kidney stones    HYPERTENSION 11/28/2006   Hypertension    Mitral regurgitation    MURMUR 11/28/2006   PEPTIC ULCER DISEASE 12/10/2007   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   LIMA to LAD, SVG to PDA, EVH via right thigh   S/P mitral valve repair 04/10/2018   Complex valvuloplasty including artificial Gore-tex neochord placement x8 and Sorin Memo 4D ring annuloplasty, size 28    PAST SURGICAL HISTORY: Past Surgical History:  Procedure Laterality Date   ABDOMINAL HYSTERECTOMY     BREAST LUMPECTOMY WITH RADIOACTIVE SEED LOCALIZATION Right 08/07/2019   Procedure: RIGHT BREAST LUMPECTOMY WITH RADIOACTIVE SEED LOCALIZATION;  Surgeon: Erroll Luna, MD;  Location: [place];  Service: General;  Laterality: Right;   CARDIAC CATHETERIZATION     CHOLECYSTECTOMY     CORONARY ARTERY BYPASS GRAFT N/A 04/10/2018   Procedure: CORONARY ARTERY BYPASS GRAFTING (CABG), ON PUMP, TIMES TWO, LIMA TO LAD AND SVG TO RCA USING LEFT INTERNAL MAMMARY ARTERY AND  RIGHT GREATER SAPHENOUS VEIN HARVESTED ENDOSCOPICALLY;  Surgeon: Rexene Alberts, MD;  Location: [place];  Service: Open Heart Surgery;  Laterality: N/A;   MITRAL VALVE REPAIR N/A 04/10/2018   Procedure: MITRAL VALVE REPAIR (MVR) USING 28 MEMO 4D;  Surgeon: Rexene Alberts, MD;  Location: [place];  Service: Open Heart Surgery;  Laterality: N/A;   RIGHT/LEFT HEART CATH AND CORONARY ANGIOGRAPHY N/A 02/06/2018   Procedure: RIGHT/LEFT HEART CATH AND CORONARY ANGIOGRAPHY;  Surgeon: Nigel Mormon, MD;  Location: [place] [facility];  Service: Cardiovascular;  Laterality: N/A;   s/p multiple breast biopsy     negative   TEE WITHOUT CARDIOVERSION N/A 02/06/2018   Procedure: TRANSESOPHAGEAL ECHOCARDIOGRAM (TEE);  Surgeon: Nigel Mormon, MD;  Location: [facility];  Service: Cardiovascular;  Laterality: N/A;   TEE WITHOUT CARDIOVERSION N/A 04/10/2018   Procedure: TRANSESOPHAGEAL ECHOCARDIOGRAM (TEE);  Surgeon: Rexene Alberts, MD;  Location: [place];  Service: Open Heart Surgery;  Laterality: N/A;    FAMILY HISTORY: Family History  Problem Relation Age of Onset   Transient ischemic attack Other    Hypertension Other    Colon cancer Other    Heart disease Father    Heart failure Father    Breast cancer Sister 27   Colon cancer Brother    Cancer Maternal Uncle    Cancer Paternal Uncle    Breast cancer Cousin   Her father is age 95 (as of 2019-07-22). Her mother died at age 60 from pulmonary fibrosis. Tami Wood has 1 brother and 3 sisters. She reports breast cancer in a sister at age 5 and cancer of unknown type in a cousin. She also reports two uncles with cancer (unknown type), colon cancer in her brother, and a great uncle with leukemia.   GYNECOLOGIC HISTORY:  No LMP recorded. Patient has had a hysterectomy. Menarche: age unsure GX P 0 Contraceptive never used HRT never used  Hysterectomy? yes BSO? no   SOCIAL HISTORY: (updated 07-22-19)  Tami Wood retired from working for [employer].. She is single. She lives at home by herself,  with no pets. She attends a [employer].    ADVANCED DIRECTIVES: Not in place. She intends to name her sister, Tami Wood, as her HCPOA. She can be reached at [phone] (cell).   HEALTH MAINTENANCE: Social History   Tobacco Use   Smoking status: Never Smoker   Smokeless tobacco: Never Used  Scientific laboratory technician Use: Never used  Substance Use Topics   Alcohol use: No   Drug use: Never     Colonoscopy: date unknown  PAP: date unknown  Bone density: date unknown   No Known Allergies  Current Outpatient Medications  Medication Sig Dispense Refill   aspirin 81 MG EC tablet Take 81 mg by mouth daily.       atorvastatin (LIPITOR) 80 MG tablet TAKE 1 TABLET BY MOUTH DAILY AT 6 PM 90 tablet 3   cetirizine (ZYRTEC) 10 MG tablet Take 1 tablet (10 mg total) by mouth daily. 30 tablet 11   furosemide (LASIX) 20 MG tablet Take 1 tablet (20 mg total) by mouth daily as needed. 30 tablet 2   metoprolol succinate (TOPROL XL) 50 MG 24 hr tablet Take 1 tablet (50 mg total) by mouth daily. 90 tablet 3   sacubitril-valsartan (ENTRESTO) 49-51 MG Take 1 tablet by mouth 2 (two) times daily. (Patient taking differently: Take 2 tablets by mouth 2 (two) times daily. ) 120 tablet 0  sodium chloride (OCEAN) 0.65 % SOLN nasal spray Place 1 spray into both nostrils as needed for congestion.     No current facility-administered medications for this visit.    OBJECTIVE: African-American woman who appears stated age  45:   09/19/19 1418  BP: 130/72  Pulse: 64  Temp: 98.7 F (37.1 C)     Body mass index is 29.87 kg/m.   Wt Readings from Last 3 Encounters:  09/19/19 179 lb 8 oz (81.4 kg)  09/16/19 178 lb (80.7 kg)  08/28/19 175 lb 4 oz (79.5 kg)      ECOG FS:1 - Symptomatic but completely ambulatory  Sclerae unicteric, EOMs intact Wearing a mask No cervical or supraclavicular adenopathy Lungs no rales or rhonchi Heart regular rate and rhythm Abd soft, nontender, positive  bowel sounds MSK no focal spinal tenderness, no upper extremity lymphedema Neuro: nonfocal, well oriented, appropriate affect Breasts: The right breast is status post lumpectomy and is currently receiving radiation.  There is some hyperpigmentation.  The cosmetic result is excellent.  There is no evidence of residual or recurrent disease.  Both axillae are benign.  LAB RESULTS:  CMP     Component Value Date/Time   NA 140 07/10/2019 0836   NA 141 06/19/2019 1402   K 4.1 07/10/2019 0836   CL 106 07/10/2019 0836   CO2 30 07/10/2019 0836   GLUCOSE 98 07/10/2019 0836   BUN 9 07/10/2019 0836   BUN 9 06/19/2019 1402   CREATININE 0.77 07/10/2019 0836   CALCIUM 8.8 (L) 07/10/2019 0836   PROT 8.5 (H) 07/10/2019 0836   ALBUMIN 3.4 (L) 07/10/2019 0836   AST 39 07/10/2019 0836   ALT 44 07/10/2019 0836   ALKPHOS 144 (H) 07/10/2019 0836   BILITOT 0.7 07/10/2019 0836   GFRNONAA >60 07/10/2019 0836   GFRAA >60 07/10/2019 0836    No results found for: TOTALPROTELP, ALBUMINELP, A1GS, A2GS, BETS, BETA2SER, GAMS, MSPIKE, SPEI  Lab Results  Component Value Date   WBC 3.6 (L) 07/10/2019   NEUTROABS 1.7 07/10/2019   HGB 11.9 (L) 07/10/2019   HCT 36.9 07/10/2019   MCV 93.9 07/10/2019   PLT 156 07/10/2019    No results found for: LABCA2  No components found for: FYBOFB510  No results for input(s): INR in the last 168 hours.  No results found for: LABCA2  No results found for: CHE527  No results found for: POE423  No results found for: NTI144  No results found for: CA2729  No components found for: HGQUANT  No results found for: CEA1 / No results found for: CEA1   No results found for: AFPTUMOR  No results found for: CHROMOGRNA  No results found for: KPAFRELGTCHN, LAMBDASER, KAPLAMBRATIO (kappa/lambda light chains)  No results found for: HGBA, HGBA2QUANT, HGBFQUANT, HGBSQUAN (Hemoglobinopathy evaluation)   No results found for: LDH  No results found for: IRON, TIBC,  IRONPCTSAT (Iron and TIBC)  No results found for: FERRITIN  Urinalysis    Component Value Date/Time   COLORURINE STRAW (A) 04/06/2018 0922   APPEARANCEUR CLEAR 04/06/2018 0922   LABSPEC 1.003 (L) 04/06/2018 0922   PHURINE 7.0 04/06/2018 0922   GLUCOSEU NEGATIVE 04/06/2018 0922   GLUCOSEU NEGATIVE 08/16/2017 1710   HGBUR NEGATIVE 04/06/2018 0922   BILIRUBINUR NEGATIVE 04/06/2018 0922   KETONESUR NEGATIVE 04/06/2018 0922   PROTEINUR NEGATIVE 04/06/2018 0922   UROBILINOGEN 0.2 08/16/2017 1710   NITRITE NEGATIVE 04/06/2018 0922   LEUKOCYTESUR NEGATIVE 04/06/2018 0922     STUDIES: No results  found.   ELIGIBLE FOR AVAILABLE RESEARCH PROTOCOL: given area of necrosis not a good COMET candidate  ASSESSMENT: 69 y.o. [place] woman status post right breast biopsy 07/01/2019 for ductal carcinoma in situ, grade 2, estrogen and progesterone receptor positive.  (1) status post right lumpectomy with no sentinel lymph node sampling 08/07/2019, showing no residual cancer in the breast.  (2) adjuvant radiation to be completed 09/25/2019  (3) opted against antiestrogens   (a) status post hysterectomy  PLAN: They will seen has done very well with her local treatment which she will complete 09/25/2019.  She now can consider antiestrogens We discussed the difference between tamoxifen and anastrozole in detail. She understands that anastrozole and the aromatase inhibitors in general work by blocking estrogen production. Accordingly vaginal dryness, decrease in bone density, and of course hot flashes can result. The aromatase inhibitors can also negatively affect the cholesterol profile, although that is a minor effect. One out of 5 women on aromatase inhibitors we will feel "old and achy". This arthralgia/myalgia syndrome, which resembles fibromyalgia clinically, does resolve with stopping the medications. Accordingly this is not a reason to not try an aromatase inhibitor but it is a frequent  reason to stop it (in other words 20% of women will not be able to tolerate these medications).  Tamoxifen on the other hand does not block estrogen production. It does not "take away a woman's estrogen". It blocks the estrogen receptor in breast cells. Like anastrozole, it can also cause hot flashes. As opposed to anastrozole, tamoxifen has many estrogen-like effects. It is technically an estrogen receptor modulator. This means that in some tissues tamoxifen works like estrogen-- for example it helps strengthen the bones. It tends to improve the cholesterol profile. It can cause thickening of the endometrial lining, and even endometrial polyps or rarely cancer of the uterus.(The risk of uterine cancer due to tamoxifen is one additional cancer per thousand women year). It can cause vaginal wetness or stickiness. It can cause blood clots through this estrogen-like effect--the risk of blood clots with tamoxifen is exactly the same as with birth control pills or hormone replacement.  Neither of these agents causes mood changes or weight gain, despite the popular belief that they can have these side effects. We have data from studies comparing either of these drugs with placebo, and in those cases the control group had the same amount of weight gain and depression as the group that took the drug.  After this discussion what they will see him would like to do is simply observation.  I am not comfortable with that decision since she had a noninvasive breast cancer that she likely has been cured and the main purpose of antiestrogens was prevention.  She will have mammography in October and see me early November.  From that point I will see her on a once a year basis  She knows to call for any other issue that may develop before the next visit.  Total encounter time 25 minutes.Sarajane Jews C. Lalena Salas, MD 09/19/2019 2:57 PM Medical Oncology and Hematology [facility] [place], [place] [address] Tel. [phone]    Fax. [phone]   This document serves as a record of services personally performed by Lurline Del, MD. It was created on his behalf by Wilburn Mylar, a trained medical scribe. The creation of this record is based on the scribe's personal observations and the provider's statements to them.   Lindie Spruce MD,  have reviewed the above documentation for accuracy and completeness, and I agree with the above.   *Total Encounter Time as defined by the Centers for Medicare and Medicaid Services includes, in addition to the face-to-face time of a patient visit (documented in the note above) non-face-to-face time: obtaining and reviewing outside history, ordering and reviewing medications, tests or procedures, care coordination (communications with other health care professionals or caregivers) and documentation in the medical record.

## 2019-09-20 ENCOUNTER — Ambulatory Visit
Admission: RE | Admit: 2019-09-20 | Discharge: 2019-09-20 | Disposition: A | Discharge status: Home / Self Care | Payer: Medicare Other | Source: Ambulatory Visit | Attending: Radiation Oncology | Admitting: Radiation Oncology

## 2019-09-20 ENCOUNTER — Other Ambulatory Visit: Payer: Self-pay

## 2019-09-20 ENCOUNTER — Telehealth: Payer: Self-pay | Admitting: Oncology

## 2019-09-20 DIAGNOSIS — Z510 Encounter for antineoplastic radiation therapy: Secondary | ICD-10-CM | POA: Diagnosis not present

## 2019-09-20 DIAGNOSIS — D0511 Intraductal carcinoma in situ of right breast: Secondary | ICD-10-CM | POA: Diagnosis not present

## 2019-09-20 NOTE — Telephone Encounter (Signed)
Scheduled appt per 6/24 los. Pt confirmed appt date and time. Faxed order to [facility].

## 2019-09-23 ENCOUNTER — Ambulatory Visit
Admission: RE | Admit: 2019-09-23 | Discharge: 2019-09-23 | Disposition: A | Discharge status: Home / Self Care | Payer: Medicare Other | Source: Ambulatory Visit | Attending: Radiation Oncology | Admitting: Radiation Oncology

## 2019-09-23 ENCOUNTER — Other Ambulatory Visit: Payer: Self-pay

## 2019-09-23 DIAGNOSIS — Z510 Encounter for antineoplastic radiation therapy: Secondary | ICD-10-CM | POA: Diagnosis not present

## 2019-09-23 DIAGNOSIS — D0511 Intraductal carcinoma in situ of right breast: Secondary | ICD-10-CM | POA: Diagnosis not present

## 2019-09-24 ENCOUNTER — Other Ambulatory Visit: Payer: Self-pay

## 2019-09-24 ENCOUNTER — Encounter: Payer: Self-pay | Admitting: *Deleted

## 2019-09-24 ENCOUNTER — Ambulatory Visit
Admission: RE | Admit: 2019-09-24 | Discharge: 2019-09-24 | Disposition: A | Discharge status: Home / Self Care | Payer: Medicare Other | Source: Ambulatory Visit | Attending: Radiation Oncology | Admitting: Radiation Oncology

## 2019-09-24 DIAGNOSIS — D0511 Intraductal carcinoma in situ of right breast: Secondary | ICD-10-CM | POA: Diagnosis not present

## 2019-09-24 DIAGNOSIS — Z510 Encounter for antineoplastic radiation therapy: Secondary | ICD-10-CM | POA: Diagnosis not present

## 2019-09-25 ENCOUNTER — Other Ambulatory Visit: Payer: Self-pay

## 2019-09-25 ENCOUNTER — Encounter: Payer: Self-pay | Admitting: Radiation Oncology

## 2019-09-25 ENCOUNTER — Ambulatory Visit
Admission: RE | Admit: 2019-09-25 | Discharge: 2019-09-25 | Disposition: A | Discharge status: Home / Self Care | Payer: Medicare Other | Source: Ambulatory Visit | Attending: Radiation Oncology | Admitting: Radiation Oncology

## 2019-09-25 DIAGNOSIS — Z510 Encounter for antineoplastic radiation therapy: Secondary | ICD-10-CM | POA: Diagnosis not present

## 2019-09-25 DIAGNOSIS — D0511 Intraductal carcinoma in situ of right breast: Secondary | ICD-10-CM | POA: Diagnosis not present

## 2019-10-03 NOTE — Telephone Encounter (Signed)
From patient.

## 2019-10-03 NOTE — Telephone Encounter (Signed)
Okay to have cataract surgery at any time. We can provide her some samples until she finds out from [employer]. IF continues to have issues with cost, may switch to Losartan in future.  Thanks MJP

## 2019-10-04 ENCOUNTER — Other Ambulatory Visit: Payer: Self-pay | Admitting: Cardiology

## 2019-10-04 DIAGNOSIS — I5022 Chronic systolic (congestive) heart failure: Secondary | ICD-10-CM

## 2019-10-04 DIAGNOSIS — I10 Essential (primary) hypertension: Secondary | ICD-10-CM

## 2019-10-09 NOTE — Progress Notes (Incomplete)
  Patient Name: Tami Wood MRN: [id number] DOB: 11-14-1950 Referring Physician: Cathlean Cower (Profile Not Attached) Date of Service: 09/25/2019 [facility], [place]                                                        End Of Treatment Note  Diagnoses: D05.11-Intraductal carcinoma in situ of right breast  Cancer Staging: Stage0 RightBreast LOQ,DCIS,ER+/ PR+, Grade2  Intent: Curative  Radiation Treatment Dates: 09/04/2019 through 09/25/2019 Site Technique Total Dose (Gy) Dose per Fx (Gy) Completed Fx Beam Energies  Breast, Right: Breast_Rt 3D 42.72/42.72 2.67 16/16 6X, 10X   Narrative: The patient tolerated radiation therapy relatively well. She did report an occasional twinge in her right breast in addition to sensitivity of her right nipple and mild fatigue. On 09/16/2019, the patient was noted to have slightly darkened skin with a small patch of superficial peeling on the right breast. As treatment continued, there were minimal hyperpigmentation changes and some mild erythema.  Plan: The patient will follow-up with radiation oncology in one month.  ________________________________________________   Blair Promise, PhD, MD  This document serves as a record of services personally performed by Gery Pray, MD. It was created on his behalf by Clerance Lav, a trained medical scribe. The creation of this record is based on the scribe's personal observations and the provider's statements to them. This document has been checked and approved by the attending provider.

## 2019-10-24 ENCOUNTER — Encounter: Payer: Self-pay | Admitting: Radiation Oncology

## 2019-10-24 ENCOUNTER — Other Ambulatory Visit: Payer: Self-pay

## 2019-10-24 ENCOUNTER — Ambulatory Visit
Admission: RE | Admit: 2019-10-24 | Discharge: 2019-10-24 | Disposition: A | Discharge status: Home / Self Care | Payer: Medicare Other | Source: Ambulatory Visit | Attending: Radiation Oncology | Admitting: Radiation Oncology

## 2019-10-24 DIAGNOSIS — Z923 Personal history of irradiation: Secondary | ICD-10-CM | POA: Insufficient documentation

## 2019-10-24 DIAGNOSIS — Z79899 Other long term (current) drug therapy: Secondary | ICD-10-CM | POA: Diagnosis not present

## 2019-10-24 DIAGNOSIS — D0511 Intraductal carcinoma in situ of right breast: Secondary | ICD-10-CM | POA: Insufficient documentation

## 2019-10-24 DIAGNOSIS — Z7982 Long term (current) use of aspirin: Secondary | ICD-10-CM | POA: Insufficient documentation

## 2019-10-24 DIAGNOSIS — Z170 Estrogen receptor positive status [ER+]: Secondary | ICD-10-CM | POA: Diagnosis not present

## 2019-10-24 NOTE — Progress Notes (Signed)
Radiation Oncology         [phone] ________________________________  Name: Tami Wood MRN: [id number]  Date: 10/24/2019  DOB: 1950-12-26  Follow-Up Visit Note  CC: Biagio Borg, MD  Biagio Borg, MD    ICD-10-CM   1. Ductal carcinoma in situ (DCIS) of right breast  D05.11     Diagnosis: Stage0 RightBreast LOQ,DCIS,ER+/ PR+, Grade2  Interval Since Last Radiation: One month.  Radiation Treatment Dates: 09/04/2019 through 09/25/2019 Site Technique Total Dose (Gy) Dose per Fx (Gy) Completed Fx Beam Energies  Breast, Right: Breast_Rt 3D 42.72/42.72 2.67 16/16 6X, 10X   Narrative:  The patient returns today for routine follow-up. No significant interval history since the end of treatment.  On review of systems, she reports no complaints. She denies pain, fatigue, and skin changes.          She met with Dr. Jana Hakim and she decided against adjuvant hormonal therapy.         ALLERGIES:  has No Known Allergies.  Meds: Current Outpatient Medications  Medication Sig Dispense Refill  . aspirin 81 MG EC tablet Take 81 mg by mouth daily.      Marland Kitchen atorvastatin (LIPITOR) 80 MG tablet TAKE 1 TABLET BY MOUTH DAILY AT 6 PM 90 tablet 3  . cetirizine (ZYRTEC) 10 MG tablet Take 1 tablet (10 mg total) by mouth daily. 30 tablet 11  . ENTRESTO 49-51 MG Take 1 tablet by mouth twice daily 60 tablet 6  . furosemide (LASIX) 20 MG tablet Take 1 tablet (20 mg total) by mouth daily as needed. 30 tablet 2  . metoprolol succinate (TOPROL XL) 50 MG 24 hr tablet Take 1 tablet (50 mg total) by mouth daily. 90 tablet 3  . sodium chloride (OCEAN) 0.65 % SOLN nasal spray Place 1 spray into both nostrils as needed for congestion.     No current facility-administered medications for this encounter.    Physical Findings: The patient is in no acute distress. Patient is alert and oriented.  weight is 176 lb 14.4 oz (80.2 kg). Her temperature is 98.4 F (36.9 C). Her blood pressure is 124/74 and her  pulse is 79. Her respiration is 18 and oxygen saturation is 100%.    Lungs are clear to auscultation bilaterally. Heart has regular rate and rhythm. No palpable cervical, supraclavicular, or axillary adenopathy. Abdomen soft, non-tender, normal bowel sounds. Left breast: No palpable mass, nipple discharge, or bleeding.  Right breast: Skin has healed well.  Hyperpigmentation changes noted.  Some edema noted in the breast as well as some induration at the lumpectomy site.  No dominant mass appreciated in the breast nipple discharge or bleeding  Lab Findings: Lab Results  Component Value Date   WBC 3.6 (L) 07/10/2019   HGB 11.9 (L) 07/10/2019   HCT 36.9 07/10/2019   MCV 93.9 07/10/2019   PLT 156 07/10/2019    Radiographic Findings: No results found.  Impression: Stage0 RightBreast LOQ,DCIS,ER+/ PR+, Grade2  The patient has recovered well from her radiation therapy.  No significant residual side effects  Plan: The patient is scheduled to follow-up with Dr. Jana Hakim on 01/27/2020.  Prior to this appointment the patient will have a mammogram.  As needed follow-up in radiation oncology.    ___________________________________   Blair Promise, PhD, MD  This document serves as a record of services personally performed by Gery Pray, MD. It was created on his behalf by Clerance Lav, a trained medical scribe. The creation of this  record is based on the scribe's personal observations and the provider's statements to them. This document has been checked and approved by the attending provider.

## 2019-10-24 NOTE — Progress Notes (Signed)
Patient here for a 1 month f/u with Dr. Sondra Come. Patient denies any skin changes, pain or fatigue.  BP 124/74 (BP Location: Right Arm, Patient Position: Sitting, Cuff Size: Normal)   Pulse 79   Temp 98.4 F (36.9 C)   Resp 18   Wt 176 lb 14.4 oz (80.2 kg)   SpO2 100%   BMI 29.44 kg/m    Wt Readings from Last 3 Encounters:  10/24/19 176 lb 14.4 oz (80.2 kg)  09/19/19 179 lb 8 oz (81.4 kg)  09/16/19 178 lb (80.7 kg)

## 2019-11-22 DIAGNOSIS — H25812 Combined forms of age-related cataract, left eye: Secondary | ICD-10-CM | POA: Diagnosis not present

## 2019-11-22 DIAGNOSIS — H43822 Vitreomacular adhesion, left eye: Secondary | ICD-10-CM | POA: Diagnosis not present

## 2019-11-22 DIAGNOSIS — Z01818 Encounter for other preprocedural examination: Secondary | ICD-10-CM | POA: Diagnosis not present

## 2019-11-22 DIAGNOSIS — H25811 Combined forms of age-related cataract, right eye: Secondary | ICD-10-CM | POA: Diagnosis not present

## 2019-12-04 ENCOUNTER — Other Ambulatory Visit: Payer: Self-pay | Admitting: Cardiology

## 2019-12-04 DIAGNOSIS — I25810 Atherosclerosis of coronary artery bypass graft(s) without angina pectoris: Secondary | ICD-10-CM

## 2019-12-06 DIAGNOSIS — H2511 Age-related nuclear cataract, right eye: Secondary | ICD-10-CM | POA: Diagnosis not present

## 2019-12-06 DIAGNOSIS — H25811 Combined forms of age-related cataract, right eye: Secondary | ICD-10-CM | POA: Diagnosis not present

## 2019-12-20 DIAGNOSIS — H2512 Age-related nuclear cataract, left eye: Secondary | ICD-10-CM | POA: Diagnosis not present

## 2019-12-20 DIAGNOSIS — H25812 Combined forms of age-related cataract, left eye: Secondary | ICD-10-CM | POA: Diagnosis not present

## 2019-12-27 ENCOUNTER — Telehealth: Payer: Self-pay | Admitting: *Deleted

## 2019-12-27 NOTE — Telephone Encounter (Signed)
This RN spoke with pt per her VM stating she is canceling scheduled appt she believes is scheduled for today.  This RN informed her the appointment is for 01/27/2020 - which she then said " I want to cancel that appt too because I'm not doing the chemo"  This RN reviewed chart and noted pt completed radiation and recommendation is for antiestrogens.  This RN discussed with pt need for follow up for best outcome even if she does not proceed with the recommendations.  This RN suggested she proceed with the visit to discuss further and then make a decision about further follow up in this office or how we can coordinate her care with her primary MD.  Decline stated understanding- and that she is scheduled for her first breast scan next Friday and stated- "I'll keep the appointment with him for now and  see what they tell me about my breast exam and then let you know if I want to keep that appointment "  No further needs per this call.  This note will be forwarded to navigators per pt is under [facility] program for review of communication.

## 2020-01-03 LAB — HM MAMMOGRAPHY

## 2020-01-09 DIAGNOSIS — Z23 Encounter for immunization: Secondary | ICD-10-CM | POA: Diagnosis not present

## 2020-01-23 ENCOUNTER — Encounter: Payer: Self-pay | Admitting: Internal Medicine

## 2020-01-27 ENCOUNTER — Inpatient Hospital Stay: Payer: Medicare Other | Admitting: Oncology

## 2020-01-27 ENCOUNTER — Telehealth: Payer: Self-pay

## 2020-01-27 NOTE — Telephone Encounter (Signed)
Pt called to cancel MD appointment for 01/27/2020.  Appointment cancelled, scheduling message sent to have rescheduled.

## 2020-01-28 ENCOUNTER — Telehealth: Payer: Self-pay

## 2020-01-28 NOTE — Telephone Encounter (Signed)
This LPN spoke with pt after receiving a message from scheduling that pt did not want to r/s appt she cancelled.   Pt states, "I didn't come because I dont feel comfortable going on medicine that builds my estrogen."  This LPN explained to pt what antiestrogen therapy is, and how it is related to her treatment, benefits of medication. Pt states, "I just don't know about that right now." Explained to pt importance of appt so Dr Jana Hakim could educate pt, and if pt decides not to have treatment we could still monitor her.   Pt states, "I am up to my eye balls in doctor bills so I am trying to decide what I can and can't afford." This LPN offered to have social worker reach out for possible assistance and pt declined help at this time, stating she will call us after she "speaks to a few people."  This LPN is routing this message to our breast cancer navigators.

## 2020-01-30 NOTE — Telephone Encounter (Signed)
Hi Mickel Baas, I have reached out to [place] [employer] in [place] to see if there are any grants or resources we can get her in to help with her financial needs. Thanks [employer]

## 2020-01-31 ENCOUNTER — Encounter: Payer: Self-pay | Admitting: Licensed Clinical Social Worker

## 2020-01-31 NOTE — Progress Notes (Signed)
[place] Work  Clinical Social Work was referred by Engineer, site for assistance with grants due to "not coming for f/u d/t fear of how to pay her bills".  Clinical Social Worker contacted patient by phone  to offer support and assess for needs.  Limited grant assistance available post-active treatment, but CSW did inform patient about [employer]. Pt grateful for the information and agreed to have applications mailed to her. CSW sent today along with contact information should patient have questions or need assistance with the applications.    [place], [place] Worker [employer]

## 2020-03-02 ENCOUNTER — Other Ambulatory Visit: Payer: Self-pay

## 2020-03-02 DIAGNOSIS — I5022 Chronic systolic (congestive) heart failure: Secondary | ICD-10-CM

## 2020-03-02 DIAGNOSIS — I10 Essential (primary) hypertension: Secondary | ICD-10-CM

## 2020-03-02 MED ORDER — ENTRESTO 49-51 MG PO TABS: 1.0000 | ORAL_TABLET | Freq: Two times a day (BID) | ORAL | 1 refills | Status: DC

## 2020-03-10 ENCOUNTER — Other Ambulatory Visit: Payer: Self-pay | Admitting: Cardiology

## 2020-03-10 DIAGNOSIS — I5022 Chronic systolic (congestive) heart failure: Secondary | ICD-10-CM

## 2020-03-13 DIAGNOSIS — D0511 Intraductal carcinoma in situ of right breast: Secondary | ICD-10-CM | POA: Diagnosis not present

## 2020-03-16 ENCOUNTER — Ambulatory Visit: Payer: Medicare Other | Admitting: Cardiology

## 2020-03-16 ENCOUNTER — Other Ambulatory Visit: Payer: Self-pay

## 2020-03-16 ENCOUNTER — Encounter: Payer: Self-pay | Admitting: Cardiology

## 2020-03-16 VITALS — BP 128/89 | HR 66 | Resp 16 | Ht 65.0 in | Wt 175.0 lb

## 2020-03-16 DIAGNOSIS — R443 Hallucinations, unspecified: Secondary | ICD-10-CM | POA: Diagnosis not present

## 2020-03-16 DIAGNOSIS — I25810 Atherosclerosis of coronary artery bypass graft(s) without angina pectoris: Secondary | ICD-10-CM

## 2020-03-16 DIAGNOSIS — Z9889 Other specified postprocedural states: Secondary | ICD-10-CM

## 2020-03-16 DIAGNOSIS — I5022 Chronic systolic (congestive) heart failure: Secondary | ICD-10-CM | POA: Diagnosis not present

## 2020-03-16 NOTE — Progress Notes (Signed)
Follow up visit  Subjective:   Tami Wood, female    DOB: November 30, 1950, 69 y.o.   MRN: [id number]  Chief Complaint  Patient presents with  . Chronic systolic heart failure   . Follow-up    HPI  69 y/o [place] American female with mitral valve prolapse, CAD, now s/p mitral valve repair, CABGX2 (03/2018 by Dr. 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  Patient denies any cardiac complaints today.  However, she is very stressed with what she believes are some people spying on her and following her.  This has been happening for couple of months.  She believes they are shining laser light into her bedroom, making loud noises, following her around town in grocery stores etc.  She has 1 neighbor who has reportedly had them, but none of the other neighbors have her or seeing these people.  Patient does not know who these people may be.  She has reportedly restarted coughs, laughs out for evidence.  Patient has not been able to get any video or audio evidence of these events.  She is afraid to go out on walks because of these people following her.  Somewhat so that she tells me that she has even hired a Herbalist to find these people.   Current Outpatient Medications on File Prior to Visit  Medication Sig Dispense Refill  . aspirin 81 MG EC tablet Take 81 mg by mouth daily.      Marland Kitchen atorvastatin (LIPITOR) 80 MG tablet TAKE 1 TABLET BY MOUTH DAILY AT 6 PM 90 tablet 3  . cetirizine (ZYRTEC) 10 MG tablet Take 1 tablet (10 mg total) by mouth daily. 30 tablet 11  . furosemide (LASIX) 20 MG tablet Take 1 tablet (20 mg total) by mouth daily as needed. 30 tablet 2  . metoprolol succinate (TOPROL-XL) 50 MG 24 hr tablet TAKE 1 TABLET(50 MG) BY MOUTH DAILY 90 tablet 3  . sacubitril-valsartan (ENTRESTO) 49-51 MG Take 1 tablet by mouth 2 (two) times daily. 180 tablet 1  . sodium chloride (OCEAN) 0.65 % SOLN nasal spray Place 1 spray into both nostrils as needed  for congestion.     No current facility-administered medications on file prior to visit.    Cardiovascular studies:  EKG 03/16/2020: Sinus rhythm 65 bpm Diffuse nonspecific T-abnormality  Echocardiogram 06/04/2019:  Mildly depressed LV systolic function with visual EF 45-50%. Left  ventricle cavity is normal in size. Mild left ventricular hypertrophy.  Normal global wall motion. Unable to evaluate diastolic function due to  mitral valve repair. Left atrial cavity is severely dilated. Interatrial septum bulges to the  right suggests elevated left atrial pressure.  Mitral valve ring is well seated and without evidence of dehiscence.  Posterior leaflet is restricted and anterior leaflet is thickened with  appropriate excursion. No evidence of mitral stenosis. At the heart rate  of 54bpm E wave velocity 1.81ms, TVI ratio 3.1, PHT 1249mc, Mean gradient  3.89m63m. Mild mitral regurgitation.  Mild tricuspid regurgitation. No evidence of pulmonary hypertension.  Prior study dated 12/01/2018 noted LVEF 35-40%, moderately dilated LA,  Mild mitral stenosis (PG 11.7mm47m MG 3.9mmH15mcalculated MVA 1.8cm2),  Mild TR.   48 hr Holter monitor 03/15/2019:  Dominant rhythm sinus.  HR 45-98 bpm. Avg HR 68 bpm.  No atrial fibrillation/atrial flutter/SVT/VT/high grade AV block, sinus pause >3sec noted.  Rare PAC/PVC seen.  Symptoms reported: None   Procedure 04/10/2018:  (Dr. Owen)Roxy Mannsitral Valve Repair  Complex valvuloplasty including artificial Gore-tex neochord placement x8             Sorin Memo 4D ring annuloplasty (size 80m, catalog #[id number], serial #[id number]   Coronary Artery Bypass Grafting x 2              Left Internal Mammary Artery to Distal Left Anterior Descending Coronary Artery             Saphenous Vein Graft to Posterior Descending Coronary Artery             Endoscopic Vein Harvest from Right Thigh   Carotid [place]/12/2018: Right Carotid: Velocities in the  right ICA are consistent with a 1-39% stenosis.  Left Carotid: Velocities in the left ICA are consistent with a 1-39% stenosis. Vertebrals: Left vertebral artery demonstrates antegrade flow. Right vertebral             artery demonstrates bidirectional flow.  Recent labs: 07/10/2019: Glucose 98, BUN/Cr 9/0.77. EGFR >60. Na/K 140/4.1. AlKp 144, total protein 8.5. Rest of the CMP normal H/H 11.9/36.9. MCV 93. Platelets 156 TSH 0.6 normal (02/2019)  02/05/2019: Glucose 84, BUN/Cr 9/0.66. EGFR 104. Na/K 140/4.2. Rest of the CMP normal H/H 11.7/35.7. MCV 91. Platelets 171 (10/2018) Chol 152, TG 90, HDL 51, LDL 83 (07/2017) TSH normal (03/2018)   Review of Systems  Cardiovascular: Positive for leg swelling. Negative for chest pain, dyspnea on exertion, palpitations and syncope.       Vitals:   03/16/20 1328  BP: 128/89  Pulse: 66  Resp: 16  SpO2: 98%     Objective:   Physical Exam Vitals and nursing note reviewed.  Constitutional:      Appearance: She is well-developed.  Neck:     Vascular: No JVD.  Cardiovascular:     Rate and Rhythm: Normal rate and regular rhythm.     Pulses: Intact distal pulses.     Heart sounds: Normal heart sounds. No murmur heard.   Pulmonary:     Effort: Pulmonary effort is normal.     Breath sounds: Normal breath sounds. No wheezing or rales.            Assessment & Recommendations:   69y/o [place] female with mitral valve prolapse, CAD, now s/p mitral valve repair, CABGX2 (03/2018 by Dr. ORoxy Manns, hypertension, mild HFrEF, S/p Rt breast lumpectomy and radiation for DCIS.  HFrEF: Clinically euvolumic. EF improved to 40-45%. Continue metoprolol succinate to 50 mg daily, increase Entresto to 49-51 mg bid.   S/P mitral valve repair: On Aspirin 81 mg daily.  Essential hypertension: Controlled.  CAD: No angina symptoms. Continue aspirin 81 mg daily, lipitor 80 mg daily.   Possible hallucinations: I am very concerned that  her beliefs of people spying on her could be delusions/hallucinations.  While I encourage patient to get any evidence, I have referred her to psychiatrist Dr. JMare Ferrari  I am pleased that she is agreed to see him.  F/u in 3 months  [place] MD [facility] Pager: [phone]: [phone] no answer Cell [phone]

## 2020-03-17 ENCOUNTER — Telehealth: Payer: Self-pay

## 2020-03-17 NOTE — Telephone Encounter (Signed)
Application for Entresto Patient assistance was faxed to [employer]

## 2020-04-27 ENCOUNTER — Telehealth: Payer: Self-pay | Admitting: Internal Medicine

## 2020-04-27 NOTE — Telephone Encounter (Signed)
[facility]   Caller states she is feeling knots around both sides of her collarbone that she noticed this week. Pt started having a cough and scratchy throat 1 week ago as well. Denies pain, fever, drainage, difficulty breathing, chest pain, runny nose, congestion or any other symptoms at this time.  Advised to contact PCP when office is open.   Please advise.

## 2020-04-27 NOTE — Telephone Encounter (Signed)
Follow up with pt she states she feels better after drinking water and has already been schedule for an appt

## 2020-04-28 ENCOUNTER — Telehealth: Payer: Self-pay

## 2020-04-28 ENCOUNTER — Ambulatory Visit: Payer: Medicare Other | Admitting: Internal Medicine

## 2020-04-28 DIAGNOSIS — Z0289 Encounter for other administrative examinations: Secondary | ICD-10-CM

## 2020-04-28 NOTE — Telephone Encounter (Signed)
Patient called that her patient assistant wasn't received I fax it again this afternoon I will call patient to let her know

## 2020-05-03 IMAGING — DX DG CHEST 1V PORT
1 series · 1 of 1 positions shown · non-contrast
Comparison: Chest x-ray of 04/06/2018

CLINICAL DATA: Post mitral valve repair, and CABG

EXAM:
PORTABLE CHEST 1 VIEW

[chest]
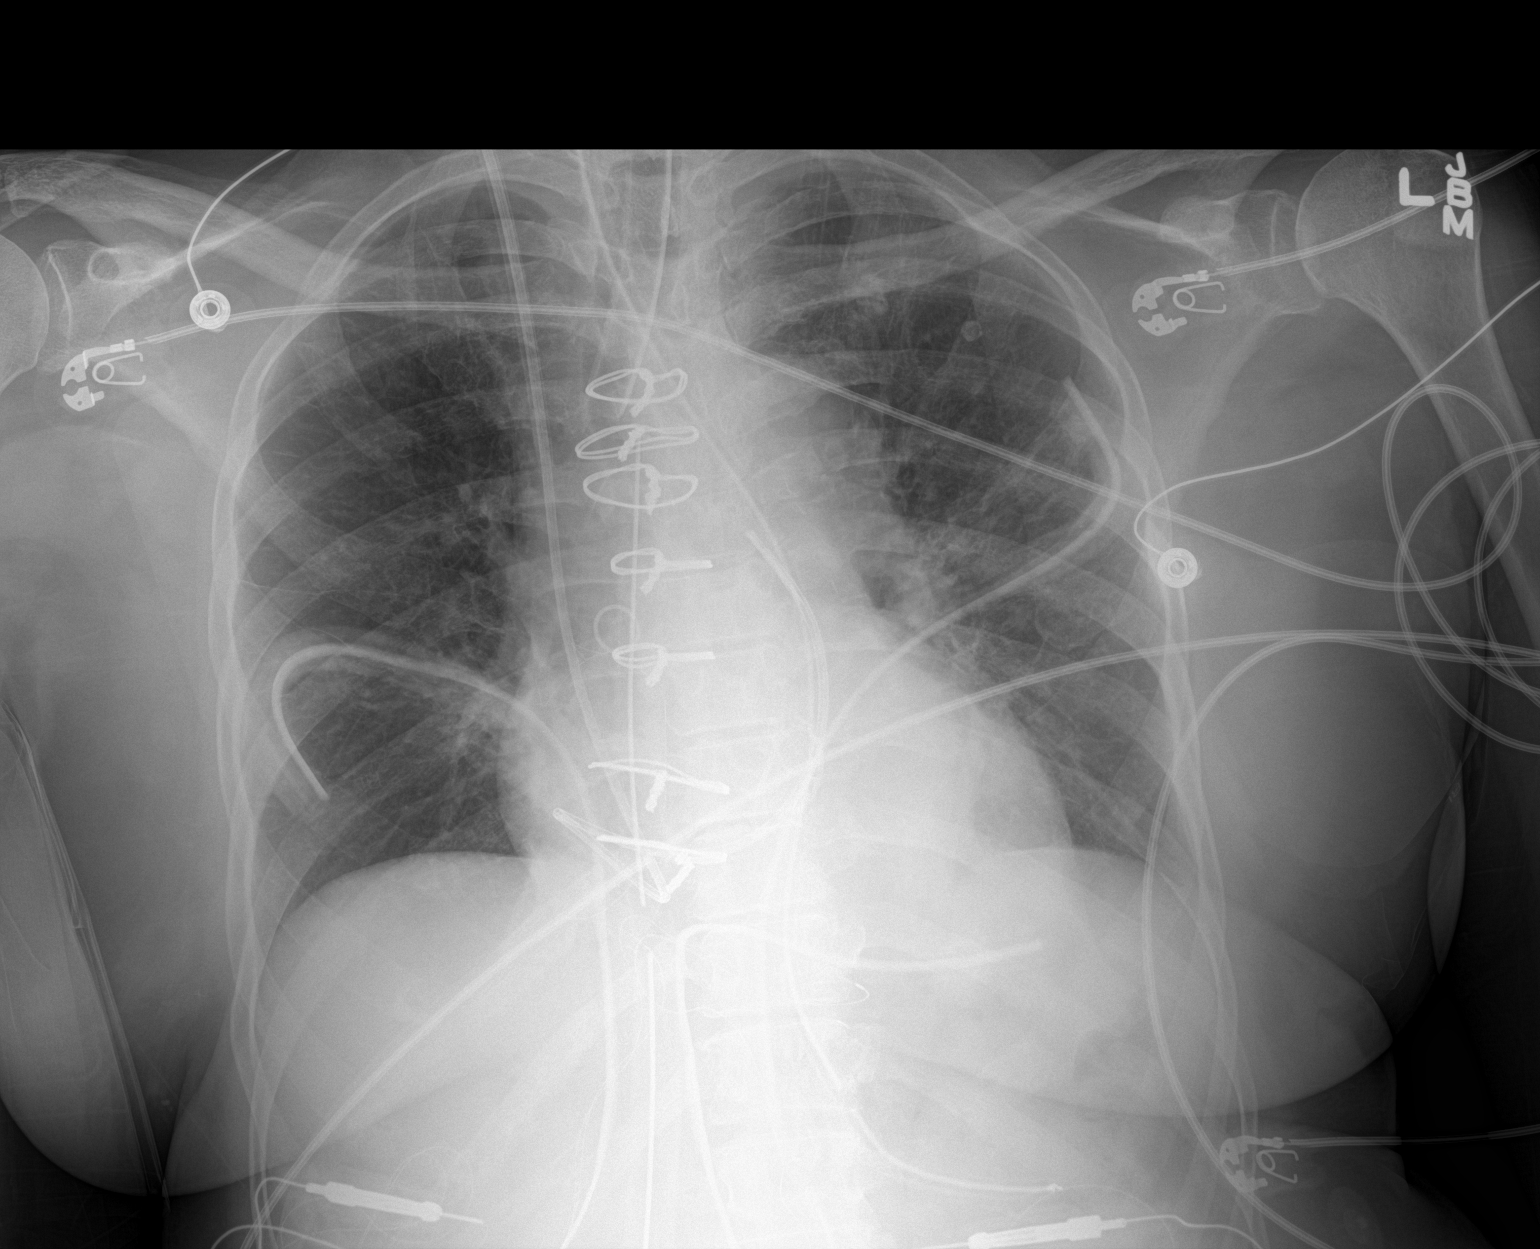

[1 of 1 positions shown; findings below may reference images not displayed]

FINDINGS: The tip of the endotracheal tube is approximately 2.8 cm above the
carina. Swan-Ganz catheter tip is in the pulmonary outflow tract and
NG tube extends below the hemidiaphragm. Mediastinal and chest tubes
are present. No pneumothorax is seen. The lungs appear clear. Mild
cardiomegaly is stable. Median sternotomy sutures are present.
IMPRESSION: 1. Post CABG, endotracheal tube tip is 2.8 cm above the carina.
2. Bilateral chest tubes are present with no pneumothorax.

## 2020-05-04 IMAGING — DX DG CHEST 1V PORT
1 series · 1 of 1 positions shown · non-contrast
Comparison: April 10, 2018

CLINICAL DATA: Status postextubation

EXAM:
PORTABLE CHEST 1 VIEW

[chest ap]
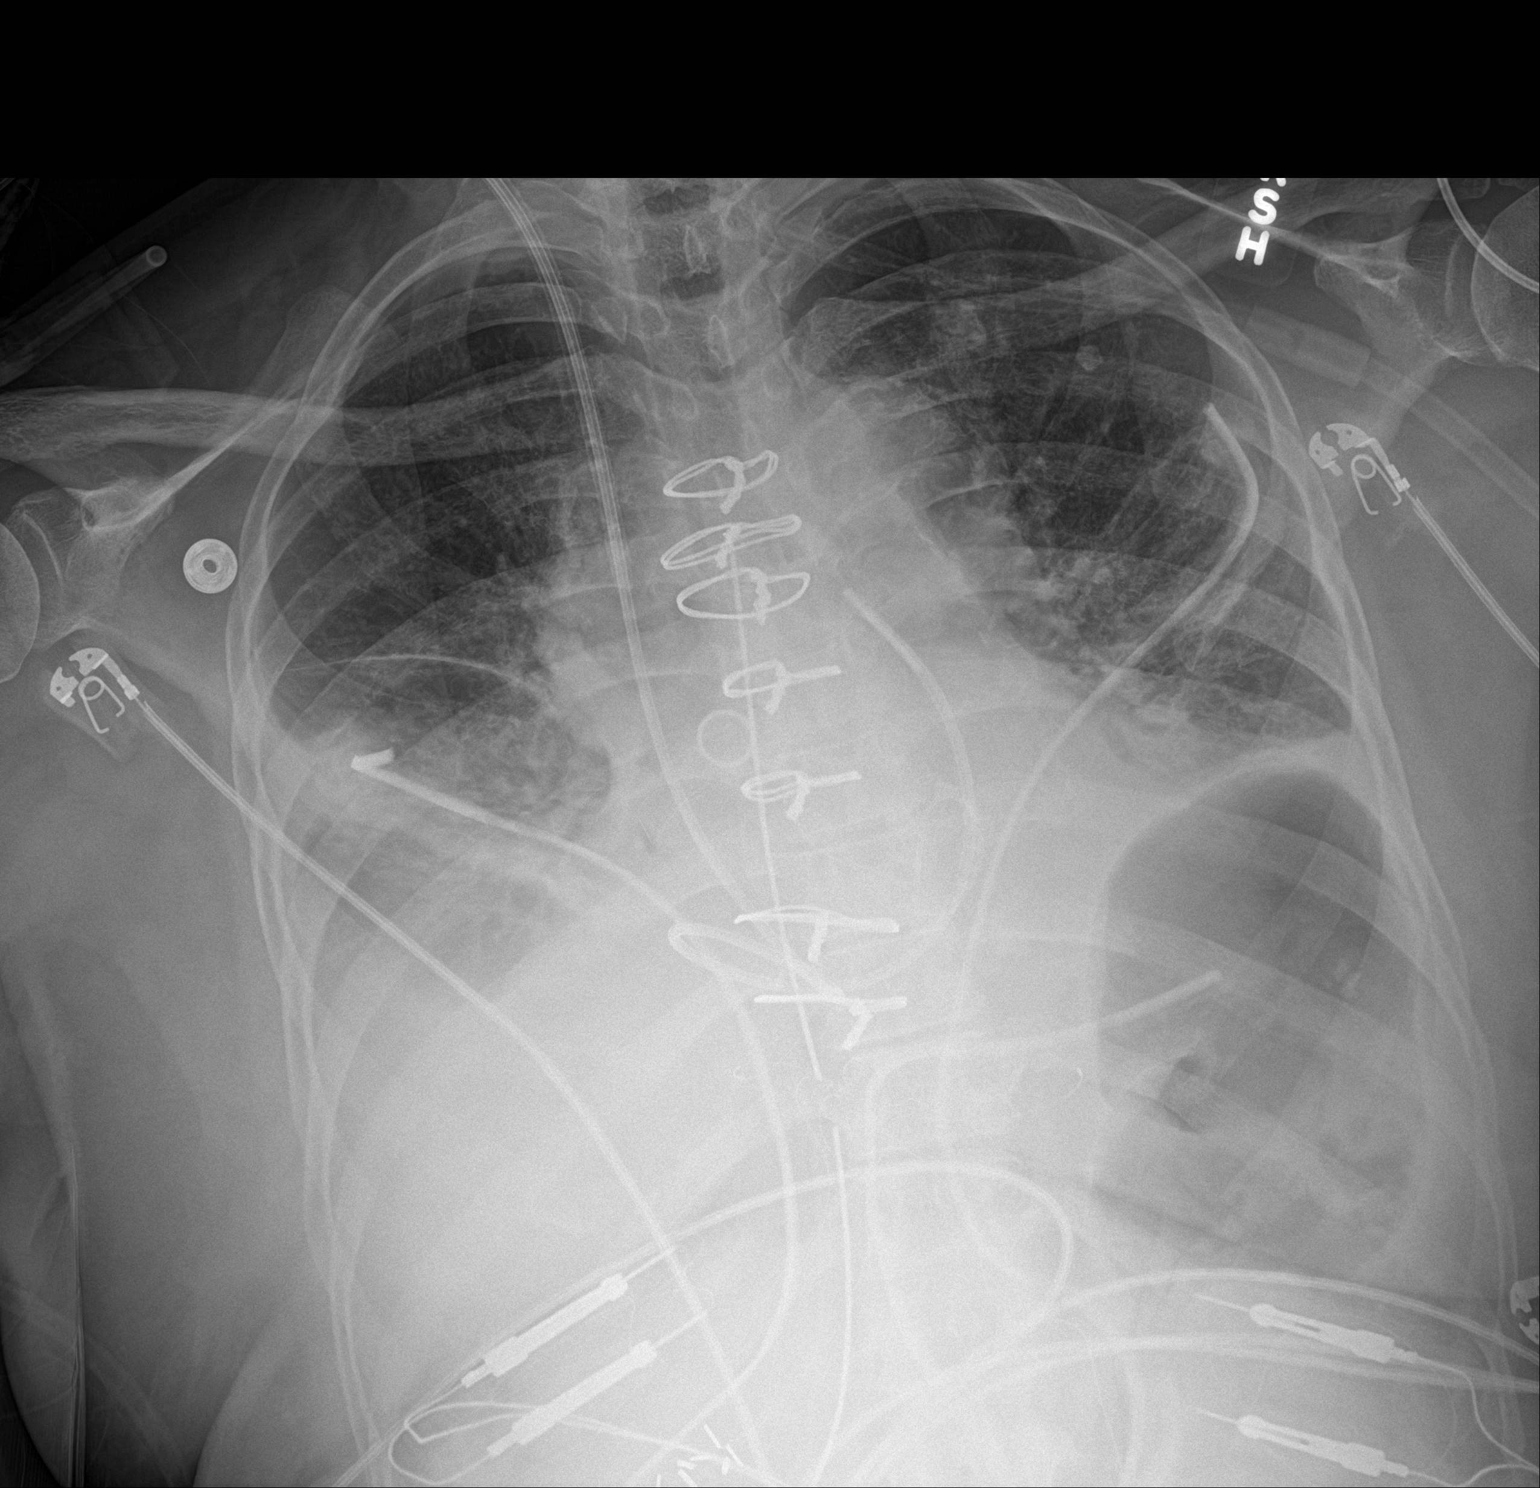

[1 of 1 positions shown; findings below may reference images not displayed]

FINDINGS: Endotracheal tube and nasogastric tube have been removed. Chest
tubes and mediastinal drain remain in place. Swan-Ganz catheter tip
is in the main pulmonary outflow tract. Temporary pacemaker wires
are attached to the right heart. No evident pneumothorax. There is a
new right pleural effusion with a smaller left pleural effusion.
There is atelectatic change in both lower lobes. Heart is upper
normal in size with pulmonary vascularity normal. No adenopathy. No
bone lesions.
IMPRESSION: Tube and catheter positions as described. No pneumothorax. Pleural
effusions bilaterally, new, larger on the right than on the left.
Atelectatic change in both lower lobes. Stable cardiac silhouette.

## 2020-05-05 IMAGING — DX DG CHEST 1V PORT
1 series · 1 of 1 positions shown · non-contrast
Comparison: 04/11/2018

CLINICAL DATA: Shortness of breath. Pleural effusion. Postop from
CABG and mitral valve replacement.

EXAM:
PORTABLE CHEST 1 VIEW

[chest ap]
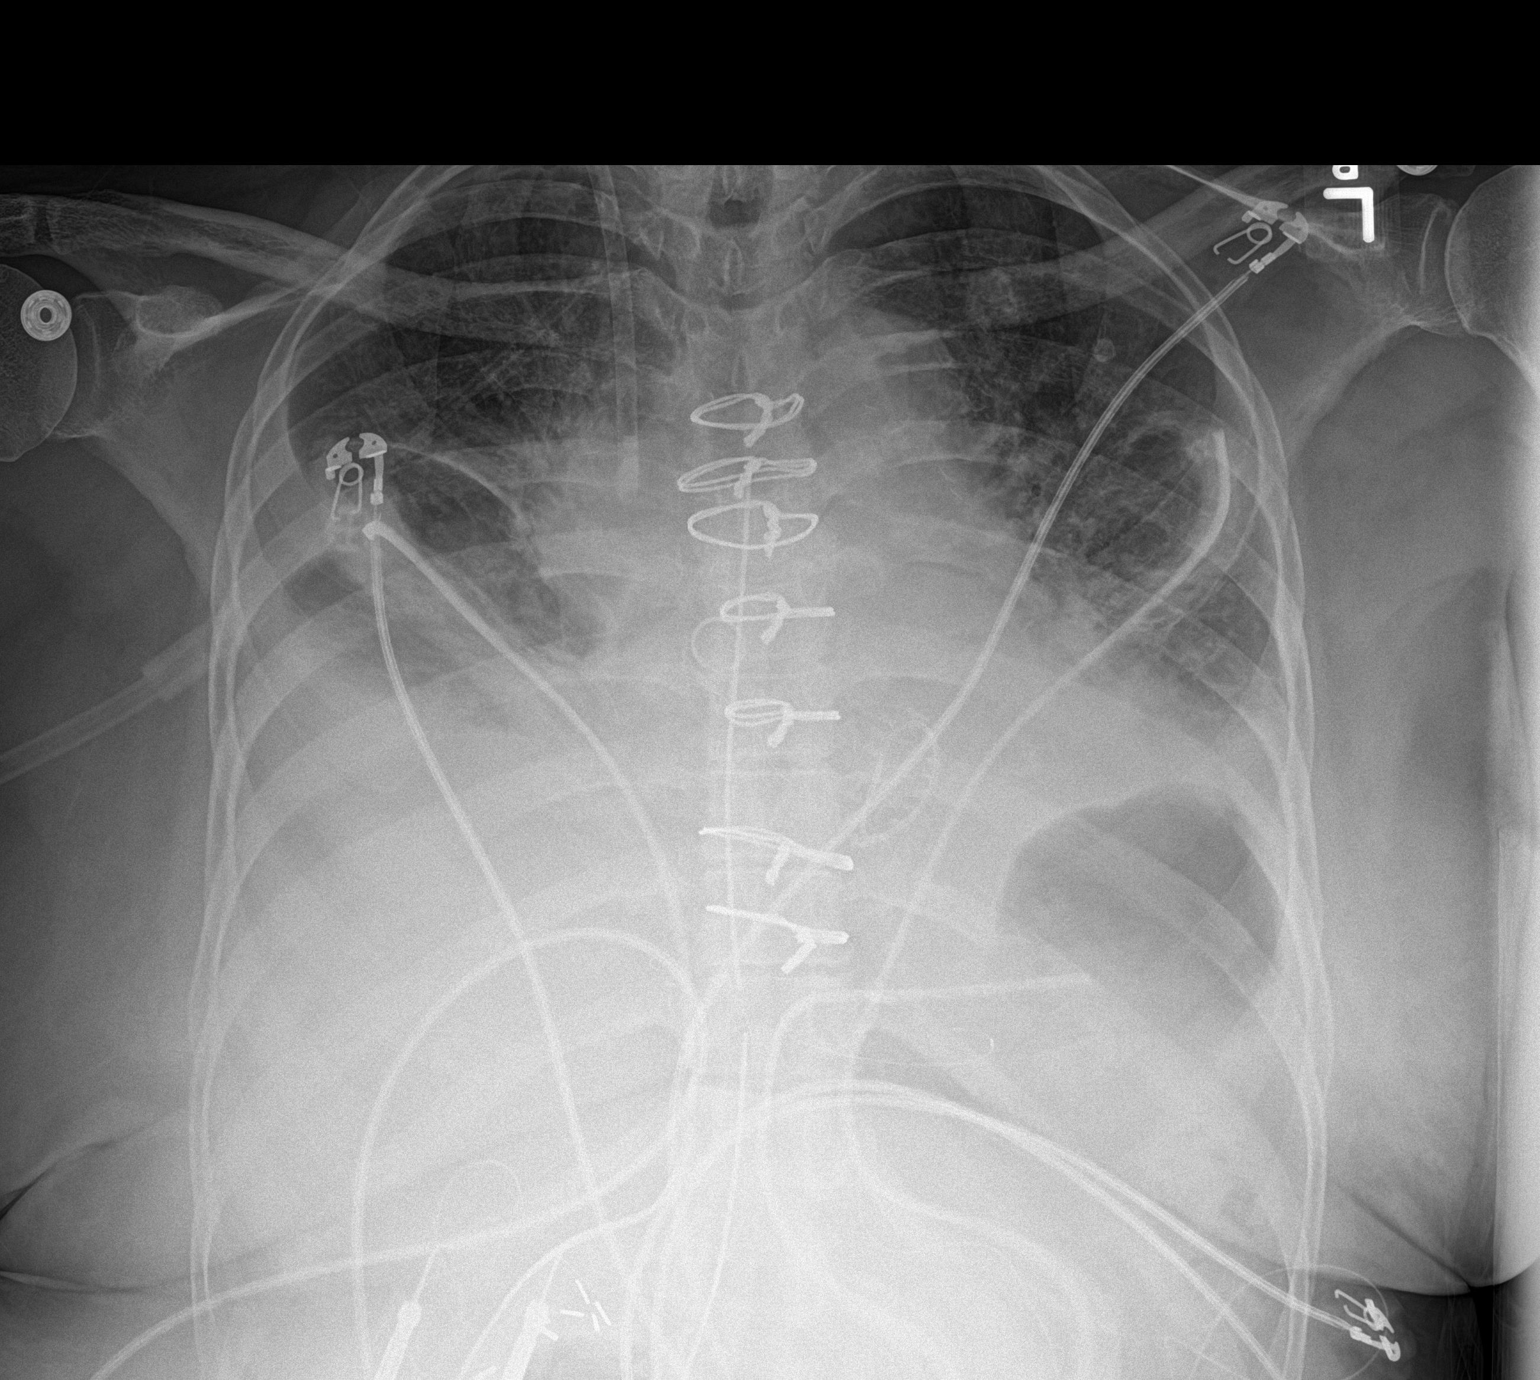

[1 of 1 positions shown; findings below may reference images not displayed]

FINDINGS: Swan-Ganz catheter has been removed. Bilateral chest tubes and
mediastinal drain remain in place. A tiny less than 5% left apical
pneumothorax is seen.

Heart size is stable. Diffuse interstitial prominence is suspicious
for mild interstitial edema. Mild increase in bilateral pleural
effusions and bibasilar atelectasis.
IMPRESSION: 1. Mild increase in bilateral pleural effusions and bibasilar
atelectasis.
2. Tiny less than 5% left apical pneumothorax.
3. Diffuse interstitial prominence, suspicious for mild interstitial
edema.

## 2020-05-06 IMAGING — DX DG CHEST 2V
2 series · 2 of 2 positions shown · non-contrast
Comparison: 04/12/2018

CLINICAL DATA: Follow-up atelectasis

EXAM:
CHEST - 2 VIEW

[w chest lat]
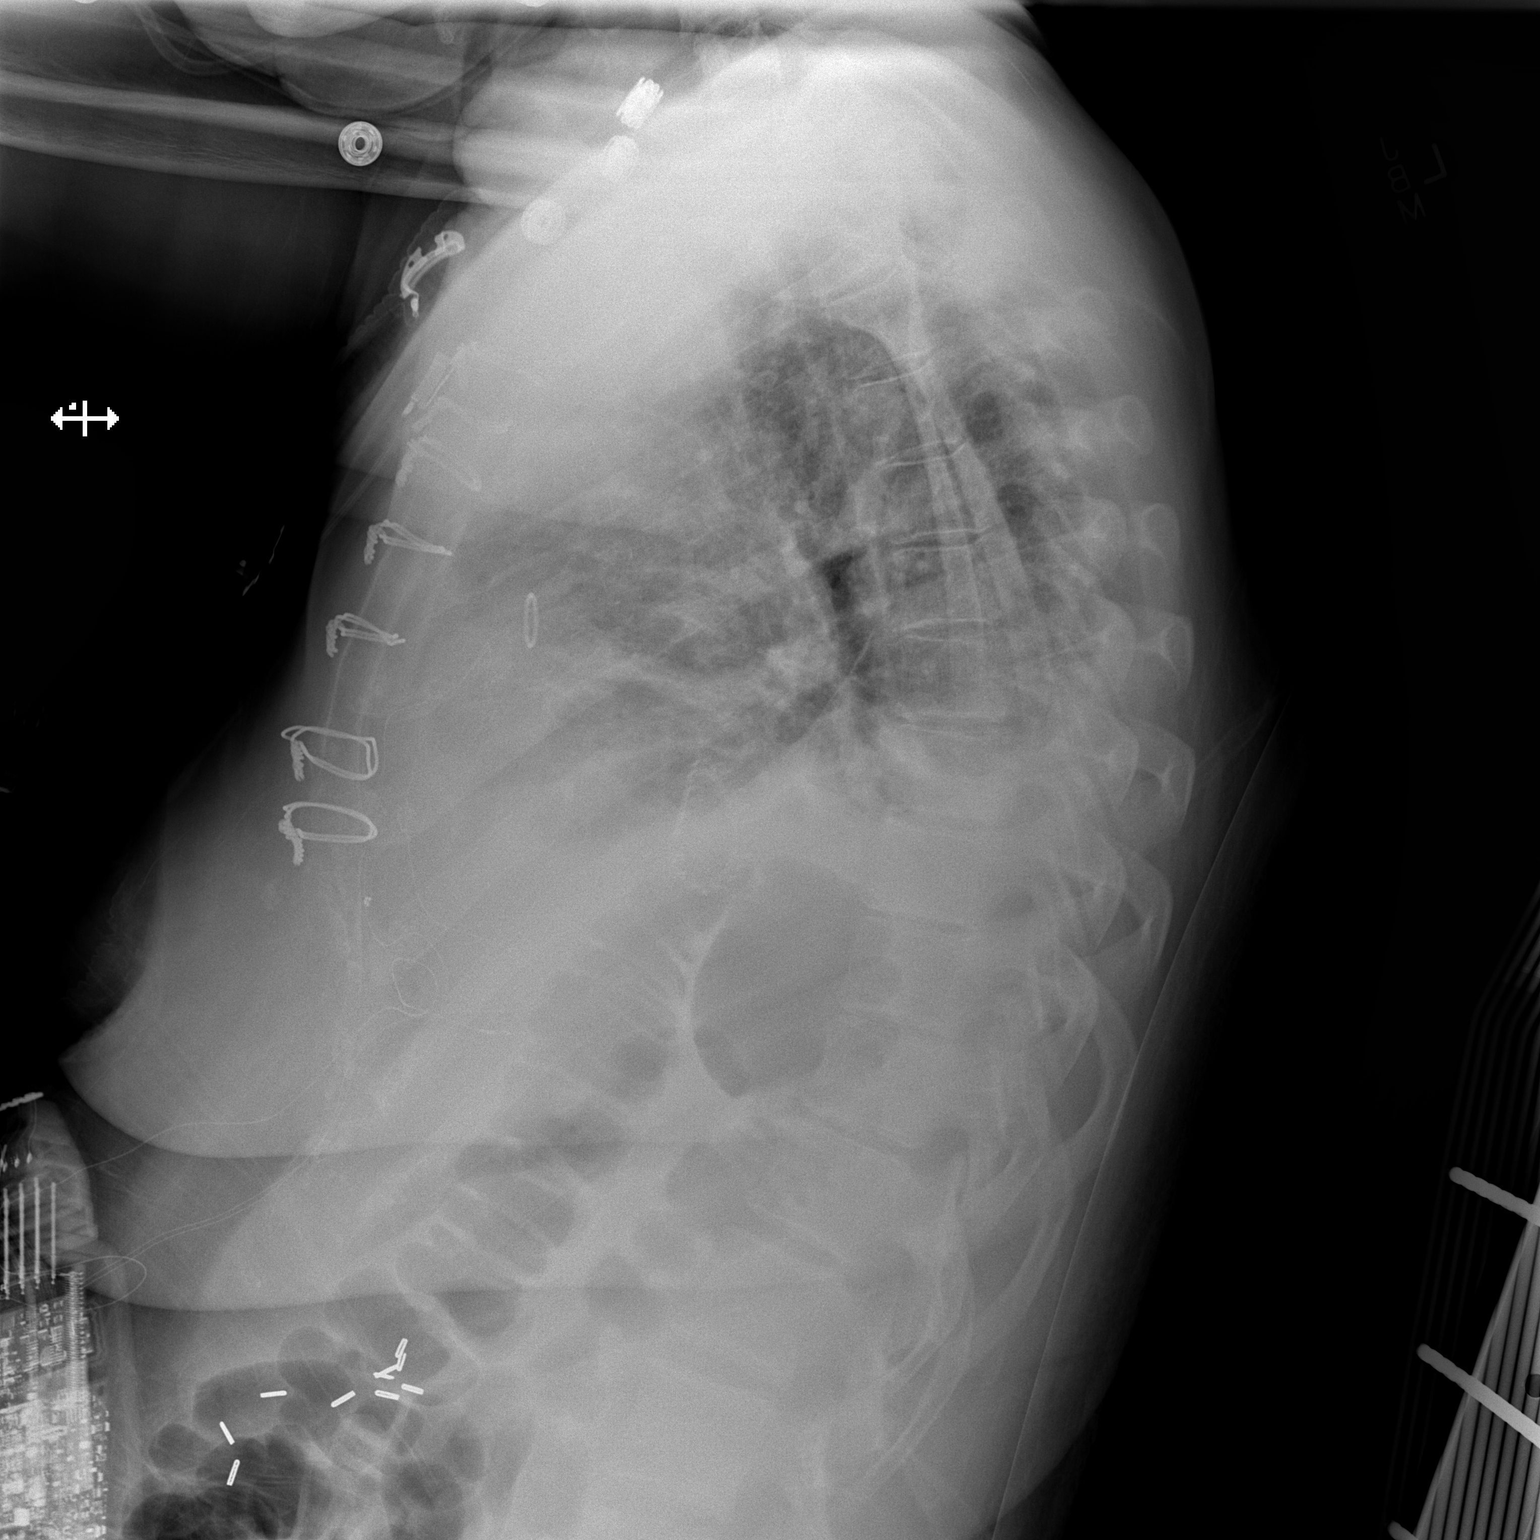

[x chest ap]
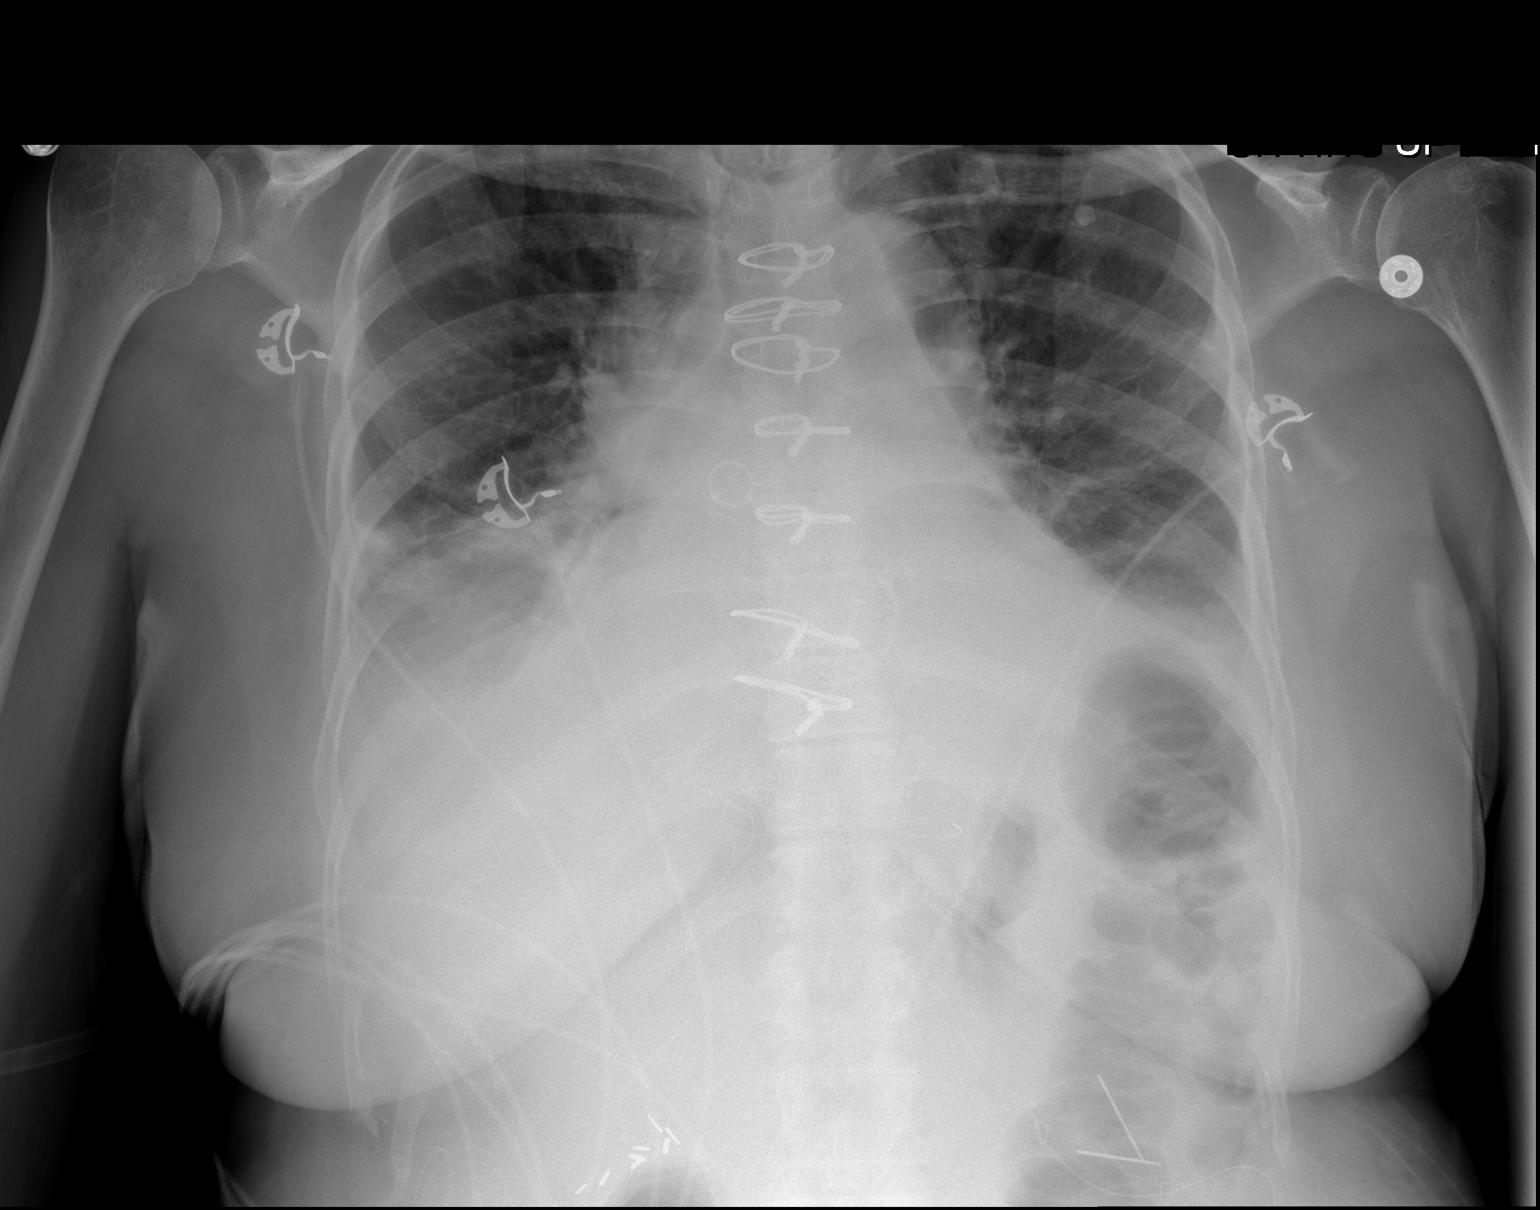

[2 of 2 positions shown; findings below may reference images not displayed]

FINDINGS: Cardiac shadow is enlarged but stable. Postsurgical changes are
again seen. Bilateral thoracostomy catheters, mediastinal drain and
pericardial drain have been removed in the interval. No pneumothorax
is seen. The overall inspiratory effort is poor with bibasilar
atelectatic changes. No bony abnormality is noted.
IMPRESSION: Bibasilar atelectasis somewhat improved from the prior exam.

Interval tube removal without pneumothorax.

## 2020-05-27 ENCOUNTER — Ambulatory Visit (INDEPENDENT_AMBULATORY_CARE_PROVIDER_SITE_OTHER): Payer: Medicare Other | Admitting: Internal Medicine

## 2020-05-27 ENCOUNTER — Other Ambulatory Visit: Payer: Self-pay

## 2020-05-27 VITALS — BP 124/82 | HR 79 | Temp 98.2°F | Ht 65.0 in | Wt 171.0 lb

## 2020-05-27 DIAGNOSIS — E538 Deficiency of other specified B group vitamins: Secondary | ICD-10-CM | POA: Diagnosis not present

## 2020-05-27 DIAGNOSIS — R739 Hyperglycemia, unspecified: Secondary | ICD-10-CM | POA: Diagnosis not present

## 2020-05-27 DIAGNOSIS — Z1159 Encounter for screening for other viral diseases: Secondary | ICD-10-CM | POA: Diagnosis not present

## 2020-05-27 DIAGNOSIS — D509 Iron deficiency anemia, unspecified: Secondary | ICD-10-CM

## 2020-05-27 DIAGNOSIS — E611 Iron deficiency: Secondary | ICD-10-CM | POA: Diagnosis not present

## 2020-05-27 DIAGNOSIS — F22 Delusional disorders: Secondary | ICD-10-CM | POA: Diagnosis not present

## 2020-05-27 DIAGNOSIS — I10 Essential (primary) hypertension: Secondary | ICD-10-CM

## 2020-05-27 DIAGNOSIS — E559 Vitamin D deficiency, unspecified: Secondary | ICD-10-CM | POA: Diagnosis not present

## 2020-05-27 LAB — BASIC METABOLIC PANEL
BUN: 13 mg/dL (ref 6–23)
CO2: 31 mEq/L (ref 19–32)
Calcium: 9.4 mg/dL (ref 8.4–10.5)
Chloride: 104 mEq/L (ref 96–112)
Creatinine, Ser: 0.67 mg/dL (ref 0.40–1.20)
GFR: 87.95 mL/min (ref >60.00)
Glucose, Bld: 120 mg/dL — ABNORMAL HIGH (ref 70–99)
Potassium: 4.1 mEq/L (ref 3.5–5.1)
Sodium: 138 mEq/L (ref 135–145)

## 2020-05-27 LAB — HEPATIC FUNCTION PANEL
ALT: 48 U/L — ABNORMAL HIGH (ref 0–35)
AST: 47 U/L — ABNORMAL HIGH (ref 0–37)
Albumin: 3.7 g/dL (ref 3.5–5.2)
Alkaline Phosphatase: 121 U/L — ABNORMAL HIGH (ref 39–117)
Bilirubin, Direct: 0.1 mg/dL (ref 0.0–0.3)
Total Bilirubin: 0.6 mg/dL (ref 0.2–1.2)
Total Protein: 8.4 g/dL — ABNORMAL HIGH (ref 6.0–8.3)

## 2020-05-27 LAB — URINALYSIS, ROUTINE W REFLEX MICROSCOPIC
Bilirubin Urine: NEGATIVE
Hgb urine dipstick: NEGATIVE
Ketones, ur: NEGATIVE
Leukocytes,Ua: NEGATIVE
Nitrite: NEGATIVE
Specific Gravity, Urine: <=1.005 — AB (ref 1.000–1.030)
Total Protein, Urine: NEGATIVE
Urine Glucose: NEGATIVE
Urobilinogen, UA: 0.2 (ref 0.0–1.0)
pH: 7 (ref 5.0–8.0)

## 2020-05-27 LAB — CBC WITH DIFFERENTIAL/PLATELET
Basophils Absolute: 0 10*3/uL (ref 0.0–0.1)
Basophils Relative: 0.5 % (ref 0.0–3.0)
Eosinophils Absolute: 0 10*3/uL (ref 0.0–0.7)
Eosinophils Relative: 1 % (ref 0.0–5.0)
HCT: 36.2 % (ref 36.0–46.0)
Hemoglobin: 12.5 g/dL (ref 12.0–15.0)
Lymphocytes Relative: 26.4 % (ref 12.0–46.0)
Lymphs Abs: 0.8 10*3/uL (ref 0.7–4.0)
MCHC: 34.5 g/dL (ref 30.0–36.0)
MCV: 91 fl (ref 78.0–100.0)
Monocytes Absolute: 0.4 10*3/uL (ref 0.1–1.0)
Monocytes Relative: 14.1 % — ABNORMAL HIGH (ref 3.0–12.0)
Neutro Abs: 1.7 10*3/uL (ref 1.4–7.7)
Neutrophils Relative %: 58 % (ref 43.0–77.0)
Platelets: 144 10*3/uL — ABNORMAL LOW (ref 150.0–400.0)
RBC: 3.97 Mil/uL (ref 3.87–5.11)
RDW: 13.6 % (ref 11.5–15.5)
WBC: 2.9 10*3/uL — ABNORMAL LOW (ref 4.0–10.5)

## 2020-05-27 LAB — IBC PANEL
Iron: 84 ug/dL (ref 42–145)
Saturation Ratios: 25.6 % (ref 20.0–50.0)
Transferrin: 234 mg/dL (ref 212.0–360.0)

## 2020-05-27 LAB — LIPID PANEL
Cholesterol: 103 mg/dL (ref 0–200)
HDL: 43 mg/dL (ref >39.00)
LDL Cholesterol: 42 mg/dL (ref 0–99)
NonHDL: 60.03
Total CHOL/HDL Ratio: 2
Triglycerides: 89 mg/dL (ref 0.0–149.0)
VLDL: 17.8 mg/dL (ref 0.0–40.0)

## 2020-05-27 LAB — HEMOGLOBIN A1C: Hgb A1c MFr Bld: 5.9 % (ref 4.6–6.5)

## 2020-05-27 NOTE — Patient Instructions (Signed)
Please continue all other medications as before, and refills have been done if requested.  Please have the pharmacy call with any other refills you may need.  Please continue your efforts at being more active, low cholesterol diet, and weight control.  You are otherwise up to date with prevention measures today.  Please keep your appointments with your specialists as you may have planned - psychiatry next week  Please go to the LAB at the blood drawing area for the tests to be done  You will be contacted by phone if any changes need to be made immediately.  Otherwise, you will receive a letter about your results with an explanation, but please check with MyChart first.  Please remember to sign up for MyChart if you have not done so, as this will be important to you in the future with finding out test results, communicating by private email, and scheduling acute appointments online when needed.  Please make an Appointment to return in 6 months, or sooner if needed

## 2020-05-27 NOTE — Progress Notes (Signed)
Patient ID: Tami Wood, female   DOB: 1950-10-26, 70 y.o.   MRN: [id number]        Chief Complaint: follow up HTN, hyperglycemia, anemia, and new delusional d/o       HPI:  Tami Wood is a 70 y.o. female here with statements c/w delusional - Also has appt to see psychiatry; is convinced her Identity has been stolen by the neighbors, and long doscourse with paranoid ideas and delussions today.  Believes she is followed everywhere she goes, but never the same people.  Pt denies chest pain, increased sob or doe, wheezing, orthopnea, PND, increased LE swelling, palpitations, dizziness or syncope.   Pt denies polydipsia, polyuria, Denies new focal neuro s/s.   Pt denies fever, wt loss, night sweats, loss of appetite, or other constitutional symptoms  No other complaints      Wt Readings from Last 3 Encounters:  05/27/20 171 lb (77.6 kg)  03/16/20 175 lb (79.4 kg)  10/24/19 176 lb 14.4 oz (80.2 kg)   BP Readings from Last 3 Encounters:  05/27/20 124/82  03/16/20 128/89  10/24/19 124/74         Past Medical History:  Diagnosis Date  . ALLERGIC RHINITIS 06/18/2009  . ANEMIA-IRON DEFICIENCY 11/28/2006  . ANXIETY 11/28/2006  . Arthritis   . Coronary artery disease   . Cough 06/18/2009  . DEPRESSION 11/28/2006  . GERD 12/10/2007  . History of kidney stones   . HYPERTENSION 11/28/2006  . Hypertension   . Mitral regurgitation   . MURMUR 11/28/2006  . PEPTIC ULCER DISEASE 12/10/2007  . 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 S/P mitral valve repair 04/10/2018   Complex valvuloplasty including artificial Gore-tex neochord placement x8 and Sorin Memo 4D ring annuloplasty, size 28   Past Surgical History:  Procedure Laterality Date  . ABDOMINAL HYSTERECTOMY    . BREAST LUMPECTOMY WITH RADIOACTIVE SEED LOCALIZATION Right 08/07/2019   Procedure: RIGHT BREAST LUMPECTOMY WITH RADIOACTIVE SEED LOCALIZATION;  Surgeon: Erroll Luna, MD;  Location: [place];   Service: General;  Laterality: Right;  . CARDIAC CATHETERIZATION    . CHOLECYSTECTOMY    . CORONARY ARTERY BYPASS GRAFT N/A 04/10/2018   Procedure: CORONARY ARTERY BYPASS GRAFTING (CABG), ON PUMP, TIMES TWO, LIMA TO LAD AND SVG TO RCA USING LEFT INTERNAL MAMMARY ARTERY AND RIGHT GREATER SAPHENOUS VEIN HARVESTED ENDOSCOPICALLY;  Surgeon: Rexene Alberts, MD;  Location: [place];  Service: Open Heart Surgery;  Laterality: N/A;  . MITRAL VALVE REPAIR N/A 04/10/2018   Procedure: MITRAL VALVE REPAIR (MVR) USING 28 MEMO 4D;  Surgeon: Rexene Alberts, MD;  Location: [place];  Service: Open Heart Surgery;  Laterality: N/A;  . RIGHT/LEFT HEART CATH AND CORONARY ANGIOGRAPHY N/A 02/06/2018   Procedure: RIGHT/LEFT HEART CATH AND CORONARY ANGIOGRAPHY;  Surgeon: Nigel Mormon, MD;  Location: [place] [facility];  Service: Cardiovascular;  Laterality: N/A;  . s/p multiple breast biopsy     negative  . TEE WITHOUT CARDIOVERSION N/A 02/06/2018   Procedure: TRANSESOPHAGEAL ECHOCARDIOGRAM (TEE);  Surgeon: Nigel Mormon, MD;  Location: [facility];  Service: Cardiovascular;  Laterality: N/A;  . TEE WITHOUT CARDIOVERSION N/A 04/10/2018   Procedure: TRANSESOPHAGEAL ECHOCARDIOGRAM (TEE);  Surgeon: Rexene Alberts, MD;  Location: [place];  Service: Open Heart Surgery;  Laterality: N/A;    reports that she has never smoked. She has never used smokeless tobacco. She reports that she does not  drink alcohol and does not use drugs. family history includes Breast cancer in her cousin; Breast cancer (age of onset: 58) in her sister; Cancer in her maternal uncle and paternal uncle; Colon cancer in her brother and another family member; Heart disease in her father; Heart failure in her father; Hypertension in an other family member; Transient ischemic attack in an other family member. No Known Allergies Current Outpatient Medications on File Prior to Visit  Medication Sig Dispense Refill  . aspirin 81 MG EC tablet Take  81 mg by mouth daily.    Marland Kitchen atorvastatin (LIPITOR) 80 MG tablet TAKE 1 TABLET BY MOUTH DAILY AT 6 PM 90 tablet 3  . metoprolol succinate (TOPROL-XL) 50 MG 24 hr tablet TAKE 1 TABLET(50 MG) BY MOUTH DAILY 90 tablet 3  . sacubitril-valsartan (ENTRESTO) 49-51 MG Take 1 tablet by mouth 2 (two) times daily. 180 tablet 1  . sodium chloride (OCEAN) 0.65 % SOLN nasal spray Place 1 spray into both nostrils as needed for congestion.    . cetirizine (ZYRTEC) 10 MG tablet Take 1 tablet (10 mg total) by mouth daily. 30 tablet 11  . furosemide (LASIX) 20 MG tablet Take 1 tablet (20 mg total) by mouth daily as needed. 30 tablet 2   No current facility-administered medications on file prior to visit.        ROS:  All others reviewed and negative.  Objective        PE:  BP 124/82 (BP Location: Left Arm, Patient Position: Sitting, Cuff Size: Normal)   Pulse 79   Temp 98.2 F (36.8 C) (Oral)   Ht 5\' 5"  (1.651 m)   Wt 171 lb (77.6 kg)   SpO2 92%   BMI 28.46 kg/m                 Constitutional: Pt appears in NAD               HENT: Head: NCAT.                Right Ear: External ear normal.                 Left Ear: External ear normal.                Eyes: . Pupils are equal, round, and reactive to light. Conjunctivae and EOM are normal               Nose: without d/c or deformity               Neck: Neck supple. Gross normal ROM               Cardiovascular: Normal rate and regular rhythm.                 Pulmonary/Chest: Effort normal and breath sounds without rales or wheezing.                Abd:  Soft, NT, ND, + BS, no organomegaly               Neurological: Pt is alert. At baseline orientation, motor grossly intact               Skin: Skin is warm. No rashes, no other new lesions, LE edema -                Psychiatric: Pt behavior is normal without agitation   Micro: none  Cardiac tracings I have personally  interpreted today:  none  Pertinent Radiological findings (summarize): none   Lab  Results  Component Value Date   WBC 2.9 (L) 05/27/2020   HGB 12.5 05/27/2020   HCT 36.2 05/27/2020   PLT 144.0 (L) 05/27/2020   GLUCOSE 120 (H) 05/27/2020   CHOL 103 05/27/2020   TRIG 89.0 05/27/2020   HDL 43.00 05/27/2020   LDLCALC 42 05/27/2020   ALT 48 (H) 05/27/2020   AST 47 (H) 05/27/2020   NA 138 05/27/2020   K 4.1 05/27/2020   CL 104 05/27/2020   CREATININE 0.67 05/27/2020   BUN 13 05/27/2020   CO2 31 05/27/2020   TSH 0.39 05/27/2020   INR 2.4 08/06/2018   HGBA1C 5.9 05/27/2020   Assessment/Plan:  Jovonda Liese is a 70 y.o. Black or African American [2] female with  has a past medical history of ALLERGIC RHINITIS (06/18/2009), ANEMIA-IRON DEFICIENCY (11/28/2006), ANXIETY (11/28/2006), Arthritis, Coronary artery disease, Cough (06/18/2009), DEPRESSION (11/28/2006), GERD (12/10/2007), History of kidney stones, HYPERTENSION (11/28/2006), Hypertension, Mitral regurgitation, MURMUR (11/28/2006), PEPTIC ULCER DISEASE (12/10/2007), S/P CABG x 2 (04/10/2018), and S/P mitral valve repair (04/10/2018).  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  Cathlean Cower, MD 06/03/2020 10:42 AM [facility]

## 2020-05-28 ENCOUNTER — Encounter: Payer: Self-pay | Admitting: Internal Medicine

## 2020-05-28 LAB — HEPATITIS C ANTIBODY
Hepatitis C Ab: NONREACTIVE
SIGNAL TO CUT-OFF: 0.04 (ref <1.00)

## 2020-05-28 LAB — VITAMIN B12: Vitamin B-12: 417 pg/mL (ref 211–911)

## 2020-05-28 LAB — FERRITIN: Ferritin: 148.2 ng/mL (ref 10.0–291.0)

## 2020-05-28 LAB — TSH: TSH: 0.39 u[IU]/mL (ref 0.35–4.50)

## 2020-05-28 LAB — VITAMIN D 25 HYDROXY (VIT D DEFICIENCY, FRACTURES): VITD: 23.23 ng/mL — ABNORMAL LOW (ref 30.00–100.00)

## 2020-06-02 DIAGNOSIS — F4329 Adjustment disorder with other symptoms: Secondary | ICD-10-CM | POA: Diagnosis not present

## 2020-06-02 DIAGNOSIS — F22 Delusional disorders: Secondary | ICD-10-CM | POA: Diagnosis not present

## 2020-06-03 ENCOUNTER — Encounter: Payer: Self-pay | Admitting: Internal Medicine

## 2020-06-03 NOTE — Assessment & Plan Note (Signed)
BP Readings from Last 3 Encounters:  05/27/20 124/82  03/16/20 128/89  10/24/19 124/74   Stable, pt to continue medical treatment toprol, entresto

## 2020-06-03 NOTE — Assessment & Plan Note (Signed)
No overt blood loss or GI or CV symptoms, for f/u lab

## 2020-06-03 NOTE — Assessment & Plan Note (Signed)
Lab Results  Component Value Date   HGBA1C 5.9 05/27/2020   Stable, pt to continue current medical treatment   - diet and wt control

## 2020-06-03 NOTE — Assessment & Plan Note (Signed)
Recent onset, for psychiatry f/u as planned, no behavioral issue

## 2020-06-16 DIAGNOSIS — F4329 Adjustment disorder with other symptoms: Secondary | ICD-10-CM | POA: Diagnosis not present

## 2020-07-09 ENCOUNTER — Other Ambulatory Visit: Payer: Self-pay

## 2020-07-09 MED ORDER — AMOXICILLIN 500 MG PO CAPS: 2000.0000 mg | ORAL_CAPSULE | ORAL | 3 refills | Status: AC

## 2020-09-14 ENCOUNTER — Ambulatory Visit: Payer: Medicare Other | Admitting: Cardiology

## 2020-09-16 ENCOUNTER — Encounter: Payer: Self-pay | Admitting: Cardiology

## 2020-09-16 ENCOUNTER — Other Ambulatory Visit: Payer: Self-pay | Admitting: Cardiology

## 2020-09-16 NOTE — Progress Notes (Signed)
Virtual visit  Subjective:   Tami Wood, female    DOB: [birth year], 70 y.o.   MRN: [id number]  I connected with the patient on 09/17/2020 by a video enabled telemedicine application and verified that I am speaking with the correct person using two identifiers.     I discussed the limitations of evaluation and management by telemedicine and the availability of in person appointments. The patient expressed understanding and agreed to proceed.   This visit type was conducted due to national recommendations for restrictions regarding the COVID-19 Pandemic (e.g. social distancing).  This format is felt to be most appropriate for this patient at this time.  All issues noted in this document were discussed and addressed.  No physical exam was performed (except for noted visual exam findings with Tele health visits).  The patient has consented to conduct a Tele health visit and understands insurance will be billed.    Chief Complaint  Patient presents with   Chronic systolic heart failure    Follow-up    6 month    HPI  70 y/o [place] American female with mitral valve prolapse, CAD, now s/p mitral valve repair, CABGX2 (03/2018 by Dr. 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  Patient is going to have deep cleaning of her teeth tomorrow. She has been prescribed amoxicillin 875 mg bid by her dentist. See below re: my recommendations.   She denies chest pain, shortness of breath, palpitations, leg edema, orthopnea, PND, TIA/syncope. Blood pressure is borderline elevated, she states that she is "excited' about the upcoming dental procedure.   On a separate note, she saw the psychiatrist Dr Mare Ferrari but has not started medications recommended by him, owing to risk of side effects. She still has delusional symptoms re: neighbors spying on her.   Reviewed labs from March 2022, details below.   Current Outpatient Medications on File Prior to Visit   Medication Sig Dispense Refill   amoxicillin (AMOXIL) 500 MG capsule Take 4 capsules (2,000 mg total) by mouth as directed. 10 capsule 3   aspirin 81 MG EC tablet Take 81 mg by mouth daily.     atorvastatin (LIPITOR) 80 MG tablet TAKE 1 TABLET BY MOUTH DAILY AT 6 PM 90 tablet 3   cetirizine (ZYRTEC) 10 MG tablet Take 1 tablet (10 mg total) by mouth daily. 30 tablet 11   furosemide (LASIX) 20 MG tablet Take 1 tablet (20 mg total) by mouth daily as needed. 30 tablet 2   metoprolol succinate (TOPROL-XL) 50 MG 24 hr tablet TAKE 1 TABLET(50 MG) BY MOUTH DAILY 90 tablet 3   sacubitril-valsartan (ENTRESTO) 49-51 MG Take 1 tablet by mouth 2 (two) times daily. 180 tablet 1   sodium chloride (OCEAN) 0.65 % SOLN nasal spray Place 1 spray into both nostrils as needed for congestion.     No current facility-administered medications on file prior to visit.    Cardiovascular studies:  EKG 03/16/2020: Sinus rhythm 65 bpm Diffuse nonspecific T-abnormality  Echocardiogram 06/04/2019:  Mildly depressed LV systolic function with visual EF 45-50%. Left  ventricle cavity is normal in size. Mild left ventricular hypertrophy.  Normal global wall motion. Unable to evaluate diastolic function due to  mitral valve repair. Left atrial cavity is severely dilated.  Interatrial septum bulges to the  right suggests elevated left atrial pressure.  Mitral valve ring is well seated and without evidence of dehiscence.  Posterior leaflet is restricted and anterior leaflet is thickened with  appropriate excursion.  No evidence of mitral stenosis. At the heart rate  of 54bpm E wave velocity 1.33ms, TVI ratio 3.1, PHT 1273mc, Mean gradient  3.4 mmHg. Mild mitral regurgitation.  Mild tricuspid regurgitation. No evidence of pulmonary hypertension.  Prior study dated 12/01/2018 noted LVEF 35-40%, moderately dilated LA,  Mild mitral stenosis (PG 11.15m46m, MG 3.9mm46m calculated MVA 1.8cm2),  Mild TR.   48 hr Holter  monitor 03/15/2019:  Dominant rhythm sinus.  HR 45-98 bpm. Avg HR 68 bpm.  No atrial fibrillation/atrial flutter/SVT/VT/high grade AV block, sinus pause >3sec noted.  Rare PAC/PVC seen.  Symptoms reported: None   Procedure 04/10/2018:  (Dr. OwenRoxy MannsMitral Valve Repair             Complex valvuloplasty including artificial Gore-tex neochord placement x8             Sorin Memo 4D ring annuloplasty (size 28mm75mtalog #4DM-28, serial #[id number] Artery Bypass Grafting x 2              Left Internal Mammary Artery to Distal Left Anterior Descending Coronary Artery             Saphenous Vein Graft to Posterior Descending Coronary Artery             Endoscopic Vein Harvest from Right Thigh    Carotid US 01[place]0/2020: Right Carotid: Velocities in the right ICA are consistent with a 1-39% stenosis.   Left Carotid: Velocities in the left ICA are consistent with a 1-39% stenosis. Vertebrals: Left vertebral artery demonstrates antegrade flow. Right vertebral             artery demonstrates bidirectional flow.  Recent labs: 05/27/2020: Glucose 120, BUN/Cr 13/0.67. EGFR 87. Na/K 138/4.1. AlKP 121. AST/ALT 47/48. Total protein 8.4 H/H 12.5/36.2. MCV 91. Platelets 144 HbA1C 5.9% Chol 103, TG 89, HDL 43, LDL 42 TSH 0.39 normal  07/10/2019: Glucose 98, BUN/Cr 9/0.77. EGFR >60. Na/K 140/4.1. AlKp 144, total protein 8.5. Rest of the CMP normal H/H 11.9/36.9. MCV 93. Platelets 156 TSH 0.6 normal (02/2019)  02/05/2019: Glucose 84, BUN/Cr 9/0.66. EGFR 104. Na/K 140/4.2. Rest of the CMP normal H/H 11.7/35.7. MCV 91. Platelets 171 (10/2018) Chol 152, TG 90, HDL 51, LDL 83 (07/2017) TSH normal (03/2018)   Review of Systems  Cardiovascular:  Negative for chest pain, dyspnea on exertion, leg swelling, palpitations and syncope.  Psychiatric/Behavioral:         Delusions      Vitals:   09/17/20 0818  BP: 135/80  Pulse: 72     Objective:   Physical Exam  Not performed, as video did not  work      Assessment & Recommendations:   69 y/[place] female with mitral valve prolapse, CAD, now s/p mitral valve repair, CABGX2 (03/2018 by Dr. 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   HFrEF: Clinically euvolumic. EF improved to 40-45%. Continue metoprolol succinate to 50 mg daily, Entresto to 49-51 mg bid.   S/P mitral valve repair: On Aspirin 81 mg daily.  Okay to hold up to 5 days prior to dental procedure.  I generally recommend amoxicillin 2 g 30-60 min prior to dental procedure for antibiotic prophylaxis.  She has been given amoxillin 875 mg bid by her dentist, which she just picked up. Instead, I asked her to take 2 pills (1750 mg) 30-60 min before the procedure.   Essential hypertension: Controlled.   CAD: No angina  symptoms. Continue aspirin 81 mg daily. Given elevated liver enzymes, reduced lipitor to 40 mg daily. Repeat CMP in 11/2020. If not normalized, may need GI consultation.   Delusions: I again encouraged the patient to follow Dr. Peterson Lombard recommendations.  Echocardiogram, labs and f/u in 3 months  [place], MD [facility] Pager: [phone] Office: [phone] If no answer Cell [phone]

## 2020-09-17 ENCOUNTER — Telehealth: Payer: Medicare Other | Admitting: Cardiology

## 2020-09-17 ENCOUNTER — Encounter: Payer: Self-pay | Admitting: Cardiology

## 2020-09-17 ENCOUNTER — Other Ambulatory Visit: Payer: Self-pay

## 2020-09-17 VITALS — BP 135/80 | HR 72 | Ht 65.0 in | Wt 171.0 lb

## 2020-09-17 DIAGNOSIS — I25810 Atherosclerosis of coronary artery bypass graft(s) without angina pectoris: Secondary | ICD-10-CM | POA: Diagnosis not present

## 2020-09-17 DIAGNOSIS — I10 Essential (primary) hypertension: Secondary | ICD-10-CM

## 2020-09-17 DIAGNOSIS — I5022 Chronic systolic (congestive) heart failure: Secondary | ICD-10-CM

## 2020-09-17 DIAGNOSIS — R748 Abnormal levels of other serum enzymes: Secondary | ICD-10-CM | POA: Diagnosis not present

## 2020-09-17 DIAGNOSIS — F22 Delusional disorders: Secondary | ICD-10-CM | POA: Diagnosis not present

## 2020-09-17 DIAGNOSIS — Z9889 Other specified postprocedural states: Secondary | ICD-10-CM

## 2020-09-17 MED ORDER — ATORVASTATIN CALCIUM 40 MG PO TABS: 40.0000 mg | ORAL_TABLET | Freq: Every day | ORAL | 3 refills | Status: DC

## 2020-10-03 ENCOUNTER — Encounter (HOSPITAL_COMMUNITY): Payer: Self-pay

## 2020-10-27 ENCOUNTER — Other Ambulatory Visit: Payer: Medicare Other

## 2020-10-30 ENCOUNTER — Ambulatory Visit: Payer: Medicare Other

## 2020-10-30 ENCOUNTER — Other Ambulatory Visit: Payer: Self-pay

## 2020-10-30 DIAGNOSIS — I5022 Chronic systolic (congestive) heart failure: Secondary | ICD-10-CM | POA: Diagnosis not present

## 2020-11-05 ENCOUNTER — Other Ambulatory Visit: Payer: Medicare Other

## 2020-11-23 ENCOUNTER — Telehealth: Payer: Self-pay | Admitting: Internal Medicine

## 2020-11-23 NOTE — Telephone Encounter (Signed)
Left message for patient to call me back at [phone] to schedule Medicare Annual Wellness Visit   No hx of AWV eligible as of 12/27/14  Please schedule at anytime with [facility] Advisor if patient calls the office back.    40 Minutes appointment   Any questions, please call me at [phone]

## 2020-11-29 ENCOUNTER — Other Ambulatory Visit: Payer: Self-pay | Admitting: Cardiology

## 2020-11-29 DIAGNOSIS — I25810 Atherosclerosis of coronary artery bypass graft(s) without angina pectoris: Secondary | ICD-10-CM

## 2020-12-04 DIAGNOSIS — R748 Abnormal levels of other serum enzymes: Secondary | ICD-10-CM | POA: Diagnosis not present

## 2020-12-05 LAB — COMPREHENSIVE METABOLIC PANEL
ALT: 43 IU/L — ABNORMAL HIGH (ref 0–32)
AST: 45 IU/L — ABNORMAL HIGH (ref 0–40)
Albumin/Globulin Ratio: 0.8 — ABNORMAL LOW (ref 1.2–2.2)
Albumin: 3.7 g/dL (ref 3.7–4.7)
Alkaline Phosphatase: 192 IU/L — ABNORMAL HIGH (ref 44–121)
BUN/Creatinine Ratio: 14 (ref 12–28)
BUN: 10 mg/dL (ref 8–27)
Bilirubin Total: 0.4 mg/dL (ref 0.0–1.2)
CO2: 25 mmol/L (ref 20–29)
Calcium: 9 mg/dL (ref 8.7–10.3)
Chloride: 102 mmol/L (ref 96–106)
Creatinine, Ser: 0.72 mg/dL (ref 0.57–1.00)
Globulin, Total: 4.6 g/dL — ABNORMAL HIGH (ref 1.5–4.5)
Glucose: 105 mg/dL — ABNORMAL HIGH (ref 65–99)
Potassium: 4.2 mmol/L (ref 3.5–5.2)
Sodium: 139 mmol/L (ref 134–144)
Total Protein: 8.3 g/dL (ref 6.0–8.5)
eGFR: 89 mL/min/{1.73_m2} (ref >59)

## 2020-12-15 ENCOUNTER — Ambulatory Visit: Payer: Medicare Other

## 2020-12-18 ENCOUNTER — Ambulatory Visit: Payer: Medicare Other | Admitting: Cardiology

## 2020-12-18 ENCOUNTER — Other Ambulatory Visit: Payer: Self-pay

## 2020-12-18 ENCOUNTER — Encounter: Payer: Self-pay | Admitting: Cardiology

## 2020-12-18 VITALS — BP 121/80 | HR 67 | Temp 98.0°F | Resp 16 | Ht 65.0 in | Wt 180.0 lb

## 2020-12-18 DIAGNOSIS — I5022 Chronic systolic (congestive) heart failure: Secondary | ICD-10-CM | POA: Diagnosis not present

## 2020-12-18 DIAGNOSIS — F22 Delusional disorders: Secondary | ICD-10-CM

## 2020-12-18 DIAGNOSIS — R748 Abnormal levels of other serum enzymes: Secondary | ICD-10-CM | POA: Diagnosis not present

## 2020-12-18 DIAGNOSIS — I25810 Atherosclerosis of coronary artery bypass graft(s) without angina pectoris: Secondary | ICD-10-CM | POA: Diagnosis not present

## 2020-12-18 MED ORDER — ATORVASTATIN CALCIUM 20 MG PO TABS
20.0000 mg | ORAL_TABLET | Freq: Every day | ORAL | 3 refills | Status: AC
Start: 2020-12-18 — End: 2022-06-15

## 2020-12-18 NOTE — Progress Notes (Signed)
Subjective:   Tami Wood, female    DOB: 01-28-1951, 70 y.o.   MRN: [id number]    Chief Complaint  Patient presents with   Chronic systolic heart failure    Follow-up   Elevated Hepatic Enzymes    HPI  70 y/o [place] American female with mitral valve prolapse, CAD, now s/p mitral valve repair, CABGX2 (03/2018 by Dr. 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  Since her last visit, she has gained weight which she attributes to eating ice cream.  She has had mild exertional dyspnea, denies chest pain.  She continues to have 6 delusions about neighbors spying on her.  She tells me that they used some kind of laser on her head which causes a headache.  As result, she has been taking 2 extra strength Tylenol every day.  However, given her recent elevated liver enzymes, she has now stopped using Tylenol.  She has upcoming follow-up with her PCP next week.   Current Outpatient Medications on File Prior to Visit  Medication Sig Dispense Refill   amoxicillin (AMOXIL) 500 MG capsule Take 4 capsules (2,000 mg total) by mouth as directed. 10 capsule 3   aspirin 81 MG EC tablet Take 81 mg by mouth daily.     atorvastatin (LIPITOR) 40 MG tablet Take 1 tablet (40 mg total) by mouth daily. 90 tablet 3   cetirizine (ZYRTEC) 10 MG tablet Take 1 tablet (10 mg total) by mouth daily. 30 tablet 11   furosemide (LASIX) 20 MG tablet Take 1 tablet (20 mg total) by mouth daily as needed. 30 tablet 2   metoprolol succinate (TOPROL-XL) 50 MG 24 hr tablet TAKE 1 TABLET(50 MG) BY MOUTH DAILY 90 tablet 3   sacubitril-valsartan (ENTRESTO) 49-51 MG Take 1 tablet by mouth 2 (two) times daily. 180 tablet 1   sodium chloride (OCEAN) 0.65 % SOLN nasal spray Place 1 spray into both nostrils as needed for congestion.     No current facility-administered medications on file prior to visit.    Cardiovascular studies:  EKG 12/18/2020: Sinus rhythm 68 bpm  Diffuse  nonspecific T-abnormality  Echocardiogram 10/30/2020:  Mildly depressed LV systolic function with visual EF 45-50%. Left  ventricle cavity is normal in size. Mild left ventricular hypertrophy.  Normal global wall motion. Elevated LAP based on E/e' and IAS bows from  left to right. Unable to evaluate diastolic function due to mitral valve  repair.  Mitral valve ring is well seated and without evidence of dehiscence.    Posterior leaflet is restricted and anterior leaflet is thickened with  appropriate excursion.  No evidence of mitral stenosis. At the heart rate  of 62bpm E wave velocity 1.70ms, TVI ratio 3.2, PHT 1271mc, Mean  gradient 2.20m63m.  Mild (Grade I) mitral regurgitation.  Mild tricuspid regurgitation. No evidence of pulmonary hypertension.  Compared to study 06/04/2019 no significant change.    Echocardiogram 06/04/2019:  Mildly depressed LV systolic function with visual EF 45-50%. Left  ventricle cavity is normal in size. Mild left ventricular hypertrophy.  Normal global wall motion. Unable to evaluate diastolic function due to  mitral valve repair. Left atrial cavity is severely dilated.  Interatrial septum bulges to the  right suggests elevated left atrial pressure.  Mitral valve ring is well seated and without evidence of dehiscence.  Posterior leaflet is restricted and anterior leaflet is thickened with  appropriate excursion.  No evidence of mitral stenosis. At the heart rate  of 54bpm E wave velocity 1.29ms, TVI ratio 3.1, PHT 1282mc, Mean gradient  3.4 mmHg. Mild mitral regurgitation.  Mild tricuspid regurgitation. No evidence of pulmonary hypertension.  Prior study dated 12/01/2018 noted LVEF 35-40%, moderately dilated LA,  Mild mitral stenosis (PG 11.62m100m, MG 3.9mm95m calculated MVA 1.8cm2),  Mild TR.   48 hr Holter monitor 03/15/2019:  Dominant rhythm sinus.  HR 45-98 bpm. Avg HR 68 bpm.  No atrial fibrillation/atrial flutter/SVT/VT/high grade AV block,  sinus pause >3sec noted.  Rare PAC/PVC seen.  Symptoms reported: None   Procedure 04/10/2018:  (Dr. OwenRoxy MannsMitral Valve Repair             Complex valvuloplasty including artificial Gore-tex neochord placement x8             Sorin Memo 4D ring annuloplasty (size 28mm61mtalog #4DM-28, serial #[id number] Artery Bypass Grafting x 2              Left Internal Mammary Artery to Distal Left Anterior Descending Coronary Artery             Saphenous Vein Graft to Posterior Descending Coronary Artery             Endoscopic Vein Harvest from Right Thigh    Carotid US 01[place]0/2020: Right Carotid: Velocities in the right ICA are consistent with a 1-39% stenosis.   Left Carotid: Velocities in the left ICA are consistent with a 1-39% stenosis. Vertebrals: Left vertebral artery demonstrates antegrade flow. Right vertebral             artery demonstrates bidirectional flow.  Recent labs: 12/04/2020: Glucose 105, BUN/Cr 10/0.72. EGFR 89. Na/K 139/4.2. AST/ALT 45/43. ALKP 192. Mildly elevated. Rest of the CMP normal  05/27/2020: Glucose 120, BUN/Cr 13/0.67. EGFR 87. Na/K 138/4.1. AlKP 121. AST/ALT 47/48. Total protein 8.4 H/H 12.5/36.2. MCV 91. Platelets 144 HbA1C 5.9% Chol 103, TG 89, HDL 43, LDL 42 TSH 0.39 normal  07/10/2019: Glucose 98, BUN/Cr 9/0.77. EGFR >60. Na/K 140/4.1. AlKp 144, total protein 8.5. Rest of the CMP normal H/H 11.9/36.9. MCV 93. Platelets 156 TSH 0.6 normal (02/2019)  02/05/2019: Glucose 84, BUN/Cr 9/0.66. EGFR 104. Na/K 140/4.2. Rest of the CMP normal H/H 11.7/35.7. MCV 91. Platelets 171 (10/2018) Chol 152, TG 90, HDL 51, LDL 83 (07/2017) TSH normal (03/2018)   Review of Systems  Constitutional: Positive for weight gain.  Cardiovascular:  Positive for dyspnea on exertion. Negative for chest pain, leg swelling, palpitations and syncope.  Psychiatric/Behavioral:         Delusions about neighbors spying on her      Vitals:   12/18/20 1308  BP: 121/80  Pulse:  67  Resp: 16  Temp: 98 F (36.7 C)  SpO2: 96%     Objective:   Physical Exam Vitals and nursing note reviewed.  Constitutional:      General: She is not in acute distress. Neck:     Vascular: No JVD.  Cardiovascular:     Rate and Rhythm: Normal rate and regular rhythm.     Heart sounds: Normal heart sounds. No murmur heard. Pulmonary:     Effort: Pulmonary effort is normal.     Breath sounds: Normal breath sounds. No wheezing or rales.  Musculoskeletal:     Right lower leg: No edema.     Left lower leg: No edema.         Assessment & Recommendations:   69 y/[place] female with mitral valve prolapse,  CAD, now s/p mitral valve repair, CABGX2 (03/2018 by Dr. 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   HFrEF: Clinically euvolumic. EF improved to 45-50% (10/2020). Continue metoprolol succinate to 50 mg daily, Entresto to 49-51 mg bid.  Recent weight gain likely related to dietary indiscretion other than heart failure exacerbation. Recommend diet and lifestyle modification and follow-up in 3 months.  Elevated liver enzymes: Reduce Lipitor to 20 mg daily.  Repeat lipid panel and CMP in 3 months.  If continues to be elevated, may need further GI work-up, defer to PCP.  S/P mitral valve repair: On Aspirin 81 mg daily.  Recommend endocarditis prophylaxis for dental procedures.  Essential hypertension: Controlled.  CAD: No angina symptoms. Continue aspirin 81 mg daily. Reduce Lipitor to 20 mg daily.  Repeat lipid panel and CMP in 3 months.  If continues to be elevated, may need further GI work-up, defer to PCP.  Delusions: Persist.  Defer management to PCP/psychiatry  F/u in 3 months  Belen Zwahlen Esther Hardy, MD [facility] Pager: [phone] Office: [phone] If no answer Cell [phone]

## 2020-12-24 ENCOUNTER — Ambulatory Visit (INDEPENDENT_AMBULATORY_CARE_PROVIDER_SITE_OTHER): Payer: Medicare Other

## 2020-12-24 ENCOUNTER — Other Ambulatory Visit: Payer: Self-pay

## 2020-12-24 VITALS — BP 120/80 | HR 64 | Temp 97.6°F | Ht 65.0 in | Wt 180.6 lb

## 2020-12-24 DIAGNOSIS — Z0000 Encounter for general adult medical examination without abnormal findings: Secondary | ICD-10-CM | POA: Diagnosis not present

## 2020-12-24 NOTE — Progress Notes (Addendum)
Subjective:   Tami Wood is a 69 y.o. female who presents for Medicare Annual (Subsequent) preventive examination.  Review of Systems     Cardiac Risk Factors include: advanced age (>30men, >51 women);dyslipidemia;hypertension;family history of premature cardiovascular disease;obesity (BMI >30kg/m2)     Objective:    Today's Vitals   12/24/20 1429  BP: 120/80  Pulse: 64  Temp: 97.6 F (36.4 C)  SpO2: 95%  Weight: 180 lb 9.6 oz (81.9 kg)  Height: 5\' 5"  (1.651 m)  PainSc: 0-No pain   Body mass index is 30.05 kg/m.  Advanced Directives 12/24/2020 10/24/2019 08/28/2019 08/07/2019 07/30/2019 04/10/2018 04/06/2018  Does Patient Have a Medical Advance Directive? No No Yes No No No No  Type of Advance Directive - Public librarian;Living will - - - -  Does patient want to make changes to medical advance directive? - - No - Patient declined - - - -  Would patient like information on creating a medical advance directive? No - Patient declined No - Patient declined No - Patient declined No - Patient declined No - Patient declined No - Patient declined Yes (MAU/Ambulatory/Procedural Areas - Information given)    Current Medications (verified) Outpatient Encounter Medications as of 12/24/2020  Medication Sig   amoxicillin (AMOXIL) 500 MG capsule Take 4 capsules (2,000 mg total) by mouth as directed.   aspirin 81 MG EC tablet Take 81 mg by mouth daily.   atorvastatin (LIPITOR) 20 MG tablet Take 1 tablet (20 mg total) by mouth daily.   metoprolol succinate (TOPROL-XL) 50 MG 24 hr tablet TAKE 1 TABLET(50 MG) BY MOUTH DAILY   sacubitril-valsartan (ENTRESTO) 49-51 MG Take 1 tablet by mouth 2 (two) times daily.   sodium chloride (OCEAN) 0.65 % SOLN nasal spray Place 1 spray into both nostrils as needed for congestion.   cetirizine (ZYRTEC) 10 MG tablet Take 1 tablet (10 mg total) by mouth daily.   furosemide (LASIX) 20 MG tablet Take 1 tablet (20 mg total) by mouth daily as needed.    No facility-administered encounter medications on file as of 12/24/2020.    Allergies (verified) Patient has no known allergies.   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   LIMA to LAD, SVG to PDA, EVH via right thigh   S/P mitral valve repair 04/10/2018   Complex valvuloplasty including artificial Gore-tex neochord placement x8 and Sorin Memo 4D ring annuloplasty, size 28   Past Surgical History:  Procedure Laterality Date   ABDOMINAL HYSTERECTOMY     BREAST LUMPECTOMY WITH RADIOACTIVE SEED LOCALIZATION Right 08/07/2019   Procedure: RIGHT BREAST LUMPECTOMY WITH RADIOACTIVE SEED LOCALIZATION;  Surgeon: Erroll Luna, MD;  Location: [place];  Service: General;  Laterality: Right;   CARDIAC CATHETERIZATION     CHOLECYSTECTOMY     CORONARY ARTERY BYPASS GRAFT N/A 04/10/2018   Procedure: CORONARY ARTERY BYPASS GRAFTING (CABG), ON PUMP, TIMES TWO, LIMA TO LAD AND SVG TO RCA USING LEFT INTERNAL MAMMARY ARTERY AND RIGHT GREATER SAPHENOUS VEIN HARVESTED ENDOSCOPICALLY;  Surgeon: Rexene Alberts, MD;  Location: [facility]  OR;  Service: Open Heart Surgery;  Laterality: N/A;   MITRAL VALVE REPAIR N/A 04/10/2018   Procedure: MITRAL VALVE REPAIR (MVR) USING 28 MEMO 4D;  Surgeon: Rexene Alberts, MD;  Location: [place];  Service: Open Heart Surgery;  Laterality: N/A;   RIGHT/LEFT HEART CATH AND CORONARY ANGIOGRAPHY N/A 02/06/2018   Procedure: RIGHT/LEFT HEART CATH AND CORONARY ANGIOGRAPHY;  Surgeon: Nigel Mormon, MD;  Location: [facility];  Service: Cardiovascular;  Laterality: N/A;   s/p multiple breast biopsy      negative   TEE WITHOUT CARDIOVERSION N/A 02/06/2018   Procedure: TRANSESOPHAGEAL ECHOCARDIOGRAM (TEE);  Surgeon: Nigel Mormon, MD;  Location[facility];  Service: Cardiovascular;  Laterality: N/A;   TEE WITHOUT CARDIOVERSION N/A 04/10/2018   Procedure: TRANSESOPHAGEAL ECHOCARDIOGRAM (TEE);  Surgeon: Rexene Alberts, MD;  Location: [place];  Service: Open Heart Surgery;  Laterality: N/A;   Family History  Problem Relation Age of Onset   Transient ischemic attack Other    Hypertension Other    Colon cancer Other    Heart disease Father    Heart failure Father    Breast cancer Sister 61   Colon cancer Brother    Cancer Maternal Uncle    Cancer Paternal Uncle    Breast cancer Cousin    Social History   Socioeconomic History   Marital status: Single    Spouse name: Not on file   Number of children: 0   Years of education: Not on file   Highest education level: High school graduate  Occupational History   Not on file  Tobacco Use   Smoking status: Never   Smokeless tobacco: Never  Vaping Use   Vaping Use: Never used  Substance and Sexual Activity   Alcohol use: No   Drug use: Never   Sexual activity: Not on file  Other Topics Concern   Not on file  Social History Narrative   Not on file   Social Determinants of Health   Financial Resource Strain: Low Risk    Difficulty of Paying Living Expenses: Not hard at all  Food Insecurity: No Food Insecurity   Worried About Charity fundraiser in the Last Year: Never true   [place] in the Last Year: Never true  Transportation Needs: No Transportation Needs   Lack of Transportation (Medical): No   Lack of Transportation (Non-Medical): No  Physical Activity: Sufficiently Active   Days of Exercise per Week: 5 days   Minutes of Exercise per Session: 30 min  Stress: No Stress Concern Present   Feeling of Stress : Not at all  Social Connections: Moderately Integrated   Frequency of Communication with Friends  and Family: More than three times a week   Frequency of Social Gatherings with Friends and Family: More than three times a week   Attends Religious Services: More than 4 times per year   Active Member of [employer] or Organizations: Yes   Attends Music therapist: More than 4 times per year   Marital Status: Never married    Tobacco Counseling Counseling given: Not Answered   Clinical Intake:  Pre-visit preparation completed: Yes  Pain : No/denies pain Pain Score: 0-No pain     BMI - recorded: 30.05 Nutritional Status: BMI > 30  Obese Nutritional Risks: None Diabetes: No  How often do you need to have someone help you when you read instructions, pamphlets, or other written materials from your doctor or pharmacy?:  1 - Never What is the last grade level you completed in school?: HSG;  year of college  Diabetic? no  Interpreter Needed?: No  Information entered by :: Lisette Abu, LPN   Activities of Daily Living In your present state of health, do you have any difficulty performing the following activities: 12/24/2020  Hearing? N  Vision? N  Difficulty concentrating or making decisions? N  Walking or climbing stairs? N  Dressing or bathing? N  Doing errands, shopping? N  Preparing Food and eating ? N  Using the Toilet? N  In the past six months, have you accidently leaked urine? N  Do you have problems with loss of bowel control? N  Managing your Medications? N  Managing your Finances? N  Housekeeping or managing your Housekeeping? N  Some recent data might be hidden    Patient Care Team: Biagio Borg, MD as PCP - General (Internal Medicine) Mauro Kaufmann, RN as Oncology Nurse Navigator Rockwell Germany, RN as Oncology Nurse Navigator Erroll Luna, MD as Consulting Physician (General Surgery) Magrinat, Virgie Dad, MD as Consulting Physician (Oncology) Gery Pray, MD as Consulting Physician (Radiation Oncology) Nigel Mormon, MD as  Consulting Physician (Cardiology) Rexene Alberts, MD (Inactive) as Consulting Physician (Cardiothoracic Surgery) Rozetta Nunnery, MD as Consulting Physician (Otolaryngology) Alanda Slim, Neena Rhymes, MD as Consulting Physician (Ophthalmology)  Indicate any recent Medical Services you may have received from other than [facility] providers in the past year (date may be approximate).     Assessment:   This is a routine wellness examination for Alyzabeth.  Hearing/Vision screen Hearing Screening - Comments:: Patient denied any hearing difficulty.   No hearing aids.  Vision Screening - Comments:: Patient wears corrective glasses/contacts.  Eye exam done annually by: Julian Reil, MD.  Dietary issues and exercise activities discussed: Current Exercise Habits: Home exercise routine, Type of exercise: walking, Time (Minutes): 30, Frequency (Times/Week): 5, Weekly Exercise (Minutes/Week): 150, Intensity: Moderate, Exercise limited by: cardiac condition(s)   Goals Addressed   None   Depression Screen PHQ 2/9 Scores 12/24/2020 05/27/2020 11/20/2018 04/09/2018 08/16/2017 07/01/2016 03/09/2016  PHQ - 2 Score 0 0 0 0 0 0 0    Fall Risk Fall Risk  12/24/2020 05/27/2020 11/20/2018 06/05/2018 04/09/2018  Falls in the past year? 0 0 0 0 0  Number falls in past yr: 0 - - - -  Injury with Fall? 0 - - - -  Risk for fall due to : No Fall Risks - Other (Comment) Other (Comment) -  Risk for fall due to: Comment - - Single leg stand less than 5 seconds. Single leg stand less than 5 seconds -  Follow up Falls evaluation completed - Falls evaluation completed Falls evaluation completed -    FALL RISK PREVENTION PERTAINING TO THE HOME:  Any stairs in or around the home? Yes  If so, are there any without handrails? No  Home free of loose throw rugs in walkways, pet beds, electrical cords, etc? Yes  Adequate lighting in your home to reduce risk of falls? Yes   ASSISTIVE DEVICES UTILIZED TO PREVENT FALLS:  Life  alert? No  Use of a cane, walker or w/c? No  Grab bars in the bathroom? No  Shower chair or bench in shower? No  Elevated toilet seat or a handicapped toilet? No   TIMED UP AND GO:  Was the test performed? Yes .  Length of time to ambulate 10 feet: 7 sec.   Gait steady  and fast without use of assistive device  Cognitive Function: Normal cognitive status assessed by direct observation by this Nurse Health Advisor. No abnormalities found.          Immunizations Immunization History  Administered Date(s) Administered   PFIZER Comirnaty(Gray Top)Covid-19 Tri-Sucrose Vaccine 05/04/2019, 05/27/2019, 01/09/2020, 08/13/2020, 12/21/2020    TDAP status: declined  Flu Vaccine status: Declined, Education has been provided regarding the importance of this vaccine but patient still declined. Advised may receive this vaccine at local pharmacy or Health Dept. Aware to provide a copy of the vaccination record if obtained from local pharmacy or Health Dept. Verbalized acceptance and understanding.  Pneumococcal vaccine status: Declined,  Education has been provided regarding the importance of this vaccine but patient still declined. Advised may receive this vaccine at local pharmacy or Health Dept. Aware to provide a copy of the vaccination record if obtained from local pharmacy or Health Dept. Verbalized acceptance and understanding.   Covid-19 vaccine status: Completed vaccines  Qualifies for Shingles Vaccine? Yes   Zostavax completed No   Shingrix Completed?: No.    Education has been provided regarding the importance of this vaccine. Patient has been advised to call insurance company to determine out of pocket expense if they have not yet received this vaccine. Advised may also receive vaccine at local pharmacy or Health Dept. Verbalized acceptance and understanding.  Screening Tests Health Maintenance  Topic Date Due   Zoster Vaccines- Shingrix (1 of 2) Never done   INFLUENZA VACCINE   Never done   DEXA SCAN  05/27/2021 (Originally 12/31/2013)   COLONOSCOPY (Pts 45-79yrs Insurance coverage will need to be confirmed)  05/27/2021 (Originally 11/18/2014)   TETANUS/TDAP  05/27/2021 (Originally 01/01/1970)   MAMMOGRAM  01/02/2022   COVID-19 Vaccine  Completed   Hepatitis C Screening  Completed   HPV VACCINES  Aged Out    Health Maintenance  Health Maintenance Due  Topic Date Due   Zoster Vaccines- Shingrix (1 of 2) Never done   INFLUENZA VACCINE  Never done    Colorectal cancer screening: No longer required.   Mammogram status: Completed 01/03/2020. Repeat every year (due 12/2020)  Bone density: no record  Lung Cancer Screening: (Low Dose CT Chest recommended if Age 30-80 years, 30 pack-year currently smoking OR have quit w/in 15years.) does not qualify.   Lung Cancer Screening Referral: no  Additional Screening:  Hepatitis C Screening: does qualify; Completed yes  Vision Screening: Recommended annual ophthalmology exams for early detection of glaucoma and other disorders of the eye. Is the patient up to date with their annual eye exam?  Yes  Who is the provider or what is the name of the office in which the patient attends annual eye exams? Julian Reil, MD. If pt is not established with a provider, would they like to be referred to a provider to establish care? No .   Dental Screening: Recommended annual dental exams for proper oral hygiene  Community Resource Referral / Chronic Care Management: CRR required this visit?  No   CCM required this visit?  No      Plan:     I have personally reviewed and noted the following in the patient's chart:   Medical and social history Use of alcohol, tobacco or illicit drugs  Current medications and supplements including opioid prescriptions.  Functional ability and status Nutritional status Physical activity Advanced directives List of other physicians Hospitalizations, surgeries, and ER visits in previous  12 months Vitals Screenings to include cognitive,  depression, and falls Referrals and appointments  In addition, I have reviewed and discussed with patient certain preventive protocols, quality metrics, and best practice recommendations. A written personalized care plan for preventive services as well as general preventive health recommendations were provided to patient.     Sheral Flow, LPN   0/32/1224   Nurse Notes:  Hearing Screening - Comments:: Patient denied any hearing difficulty.   No hearing aids.  Vision Screening - Comments:: Patient wears corrective glasses/contacts.  Eye exam done annually by: Julian Reil, MD.      Medical screening examination/treatment/procedure(s) were performed by non-physician practitioner and as supervising physician I was immediately available for consultation/collaboration.  I agree with above. Cathlean Cower, MD

## 2020-12-24 NOTE — Patient Instructions (Signed)
Ms. Tena , Thank you for taking time to come for your Medicare Wellness Visit. I appreciate your ongoing commitment to your health goals. Please review the following plan we discussed and let me know if I can assist you in the future.   Screening recommendations/referrals: Colonoscopy: done before Mammogram: scheduled 01/05/2021 Bone Density:  Recommended yearly ophthalmology/optometry visit for glaucoma screening and checkup Recommended yearly dental visit for hygiene and checkup  Vaccinations: Influenza vaccine: declined Pneumococcal vaccine: declined Tdap vaccine: declined Shingles vaccine: declined   Covid-19: 05/04/2019, 05/27/2019, 01/09/2020, 08/13/2020, 12/21/2020  Advanced directives: Please bring a copy of your health care power of attorney and living will to the office at your convenience.  Conditions/risks identified: Yes; Client understands the importance of follow-up with providers by attending scheduled visits and discussed goals to eat healthier, increase physical activity, exercise the brain, socialize more, get enough sleep and make time for laughter.  Next appointment: Please schedule your next Medicare Wellness Visit with your Nurse Health Advisor in 1 year by calling [phone].   Preventive Care 72 Years and Older, Female Preventive care refers to lifestyle choices and visits with your health care provider that can promote health and wellness. What does preventive care include? A yearly physical exam. This is also called an annual well check. Dental exams once or twice a year. Routine eye exams. Ask your health care provider how often you should have your eyes checked. Personal lifestyle choices, including: Daily care of your teeth and gums. Regular physical activity. Eating a healthy diet. Avoiding tobacco and drug use. Limiting alcohol use. Practicing safe sex. Taking low-dose aspirin every day. Taking vitamin and mineral supplements as recommended by your  health care provider. What happens during an annual well check? The services and screenings done by your health care provider during your annual well check will depend on your age, overall health, lifestyle risk factors, and family history of disease. Counseling  Your health care provider may ask you questions about your: Alcohol use. Tobacco use. Drug use. Emotional well-being. Home and relationship well-being. Sexual activity. Eating habits. History of falls. Memory and ability to understand (cognition). Work and work Statistician. Reproductive health. Screening  You may have the following tests or measurements: Height, weight, and BMI. Blood pressure. Lipid and cholesterol levels. These may be checked every 5 years, or more frequently if you are over 72 years old. Skin check. Lung cancer screening. You may have this screening every year starting at age 72 if you have a 30-pack-year history of smoking and currently smoke or have quit within the past 15 years. Fecal occult blood test (FOBT) of the stool. You may have this test every year starting at age 72. Flexible sigmoidoscopy or colonoscopy. You may have a sigmoidoscopy every 5 years or a colonoscopy every 10 years starting at age 72. Hepatitis C blood test. Hepatitis B blood test. Sexually transmitted disease (STD) testing. Diabetes screening. This is done by checking your blood sugar (glucose) after you have not eaten for a while (fasting). You may have this done every 1-3 years. Bone density scan. This is done to screen for osteoporosis. You may have this done starting at age 72. Mammogram. This may be done every 1-2 years. Talk to your health care provider about how often you should have regular mammograms. Talk with your health care provider about your test results, treatment options, and if necessary, the need for more tests. Vaccines  Your health care provider may recommend certain vaccines, such as: Influenza  vaccine.  This is recommended every year. Tetanus, diphtheria, and acellular pertussis (Tdap, Td) vaccine. You may need a Td booster every 10 years. Zoster vaccine. You may need this after age 72. Pneumococcal 13-valent conjugate (PCV13) vaccine. One dose is recommended after age 72. Pneumococcal polysaccharide (PPSV23) vaccine. One dose is recommended after age 72. Talk to your health care provider about which screenings and vaccines you need and how often you need them. This information is not intended to replace advice given to you by your health care provider. Make sure you discuss any questions you have with your health care provider. Document Released: 04/10/2015 Document Revised: 12/02/2015 Document Reviewed: 01/13/2015 Elsevier Interactive Patient Education  2017 [employer] Prevention in the Home Falls can cause injuries. They can happen to people of all ages. There are many things you can do to make your home safe and to help prevent falls. What can I do on the outside of my home? Regularly fix the edges of walkways and driveways and fix any cracks. Remove anything that might make you trip as you walk through a door, such as a raised step or threshold. Trim any bushes or trees on the path to your home. Use bright outdoor lighting. Clear any walking paths of anything that might make someone trip, such as rocks or tools. Regularly check to see if handrails are loose or broken. Make sure that both sides of any steps have handrails. Any raised decks and porches should have guardrails on the edges. Have any leaves, snow, or ice cleared regularly. Use sand or salt on walking paths during winter. Clean up any spills in your garage right away. This includes oil or grease spills. What can I do in the bathroom? Use night lights. Install grab bars by the toilet and in the tub and shower. Do not use towel bars as grab bars. Use non-skid mats or decals in the tub or shower. If you need to sit  down in the shower, use a plastic, non-slip stool. Keep the floor dry. Clean up any water that spills on the floor as soon as it happens. Remove soap buildup in the tub or shower regularly. Attach bath mats securely with double-sided non-slip rug tape. Do not have throw rugs and other things on the floor that can make you trip. What can I do in the bedroom? Use night lights. Make sure that you have a light by your bed that is easy to reach. Do not use any sheets or blankets that are too big for your bed. They should not hang down onto the floor. Have a firm chair that has side arms. You can use this for support while you get dressed. Do not have throw rugs and other things on the floor that can make you trip. What can I do in the kitchen? Clean up any spills right away. Avoid walking on wet floors. Keep items that you use a lot in easy-to-reach places. If you need to reach something above you, use a strong step stool that has a grab bar. Keep electrical cords out of the way. Do not use floor polish or wax that makes floors slippery. If you must use wax, use non-skid floor wax. Do not have throw rugs and other things on the floor that can make you trip. What can I do with my stairs? Do not leave any items on the stairs. Make sure that there are handrails on both sides of the stairs and use them. Fix  handrails that are broken or loose. Make sure that handrails are as long as the stairways. Check any carpeting to make sure that it is firmly attached to the stairs. Fix any carpet that is loose or worn. Avoid having throw rugs at the top or bottom of the stairs. If you do have throw rugs, attach them to the floor with carpet tape. Make sure that you have a light switch at the top of the stairs and the bottom of the stairs. If you do not have them, ask someone to add them for you. What else can I do to help prevent falls? Wear shoes that: Do not have high heels. Have rubber bottoms. Are  comfortable and fit you well. Are closed at the toe. Do not wear sandals. If you use a stepladder: Make sure that it is fully opened. Do not climb a closed stepladder. Make sure that both sides of the stepladder are locked into place. Ask someone to hold it for you, if possible. Clearly mark and make sure that you can see: Any grab bars or handrails. First and last steps. Where the edge of each step is. Use tools that help you move around (mobility aids) if they are needed. These include: Canes. Walkers. Scooters. Crutches. Turn on the lights when you go into a dark area. Replace any light bulbs as soon as they burn out. Set up your furniture so you have a clear path. Avoid moving your furniture around. If any of your floors are uneven, fix them. If there are any pets around you, be aware of where they are. Review your medicines with your doctor. Some medicines can make you feel dizzy. This can increase your chance of falling. Ask your doctor what other things that you can do to help prevent falls. This information is not intended to replace advice given to you by your health care provider. Make sure you discuss any questions you have with your health care provider. Document Released: 01/08/2009 Document Revised: 08/20/2015 Document Reviewed: 04/18/2014 Elsevier Interactive Patient Education  2017 [employer].

## 2020-12-24 NOTE — Progress Notes (Signed)
Medical screening examination/treatment/procedure(s) were performed by non-physician practitioner and as supervising physician I was immediately available for consultation/collaboration. I agree with above. Ambria Mayfield, MD   

## 2021-01-18 DIAGNOSIS — R922 Inconclusive mammogram: Secondary | ICD-10-CM | POA: Diagnosis not present

## 2021-01-18 LAB — HM MAMMOGRAPHY

## 2021-02-26 ENCOUNTER — Other Ambulatory Visit: Payer: Self-pay | Admitting: Cardiology

## 2021-02-26 DIAGNOSIS — I5022 Chronic systolic (congestive) heart failure: Secondary | ICD-10-CM

## 2021-03-09 ENCOUNTER — Other Ambulatory Visit: Payer: Self-pay

## 2021-03-09 ENCOUNTER — Ambulatory Visit (INDEPENDENT_AMBULATORY_CARE_PROVIDER_SITE_OTHER): Payer: Medicare Other | Admitting: Internal Medicine

## 2021-03-09 ENCOUNTER — Ambulatory Visit (INDEPENDENT_AMBULATORY_CARE_PROVIDER_SITE_OTHER): Payer: Medicare Other

## 2021-03-09 VITALS — BP 122/78 | HR 71 | Temp 97.7°F | Wt 183.0 lb

## 2021-03-09 DIAGNOSIS — F22 Delusional disorders: Secondary | ICD-10-CM

## 2021-03-09 DIAGNOSIS — R443 Hallucinations, unspecified: Secondary | ICD-10-CM

## 2021-03-09 DIAGNOSIS — I10 Essential (primary) hypertension: Secondary | ICD-10-CM

## 2021-03-09 DIAGNOSIS — E559 Vitamin D deficiency, unspecified: Secondary | ICD-10-CM | POA: Diagnosis not present

## 2021-03-09 DIAGNOSIS — I5022 Chronic systolic (congestive) heart failure: Secondary | ICD-10-CM

## 2021-03-09 DIAGNOSIS — R519 Headache, unspecified: Secondary | ICD-10-CM

## 2021-03-09 DIAGNOSIS — R748 Abnormal levels of other serum enzymes: Secondary | ICD-10-CM | POA: Diagnosis not present

## 2021-03-09 DIAGNOSIS — I25810 Atherosclerosis of coronary artery bypass graft(s) without angina pectoris: Secondary | ICD-10-CM

## 2021-03-09 MED ORDER — ENTRESTO 49-51 MG PO TABS: 1.0000 | ORAL_TABLET | Freq: Two times a day (BID) | ORAL | 0 refills | Status: DC

## 2021-03-09 NOTE — Assessment & Plan Note (Signed)
Last vitamin D Lab Results  Component Value Date   VD25OH 23.23 (L) 05/27/2020   Low, to start oral replacement

## 2021-03-09 NOTE — Progress Notes (Signed)
Patient ID: Tami Wood, female   DOB: 06/06/1950, 70 y.o.   MRN: [id number]        Chief Complaint: follow up delusions, HA, low vit d, hld       HPI:  Tami Wood is a 70 y.o. female here with sister who "wants something done" about pt's persistent delusions and hallucinations regarding persons in the home and car noise in the home that the sister cannot appreciate.  Pt has seen psychiatry but declines treatment as she doesn't seen any problem.  Pt also with recurring mild diffuse HA's intermittent for most of the past year, without other focal neuro s/s.   Is not MRI candidate.  Pt not taking vit d.  Pt did have lipitor decreased recently after seen with mild elevated LFTs earlier today with cardiology.  Pt denies chest pain, increased sob or doe, wheezing, orthopnea, PND, increased LE swelling, palpitations, dizziness or syncope.   Pt denies polydipsia, polyuria,   Pt denies fever, wt loss, night sweats, loss of appetite, or other constitutional symptoms         Wt Readings from Last 3 Encounters:  03/09/21 183 lb (83 kg)  12/24/20 180 lb 9.6 oz (81.9 kg)  12/18/20 180 lb (81.6 kg)   BP Readings from Last 3 Encounters:  03/09/21 122/78  12/24/20 120/80  12/18/20 121/80         Past Medical History:  Diagnosis Date   ALLERGIC RHINITIS 06/18/2009   ANEMIA-IRON DEFICIENCY 11/28/2006   ANXIETY 11/28/2006   Arthritis    Coronary artery disease    Cough 06/18/2009   DEPRESSION 11/28/2006   GERD 12/10/2007   History of kidney stones    HYPERTENSION 11/28/2006   Hypertension    Mitral regurgitation    MURMUR 11/28/2006   PEPTIC ULCER DISEASE 12/10/2007   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   LIMA to LAD, SVG to PDA, EVH via right thigh   S/P mitral valve repair 04/10/2018   Complex valvuloplasty including artificial Gore-tex neochord placement x8 and Sorin Memo 4D ring annuloplasty, size 28   Past Surgical History:  Procedure Laterality Date   ABDOMINAL HYSTERECTOMY     BREAST LUMPECTOMY WITH  RADIOACTIVE SEED LOCALIZATION Right 08/07/2019   Procedure: RIGHT BREAST LUMPECTOMY WITH RADIOACTIVE SEED LOCALIZATION;  Surgeon: Erroll Luna, MD;  Location: [place];  Service: General;  Laterality: Right;   CARDIAC CATHETERIZATION     CHOLECYSTECTOMY     CORONARY ARTERY BYPASS GRAFT N/A 04/10/2018   Procedure: CORONARY ARTERY BYPASS GRAFTING (CABG), ON PUMP, TIMES TWO, LIMA TO LAD AND SVG TO RCA USING LEFT INTERNAL MAMMARY ARTERY AND RIGHT GREATER SAPHENOUS VEIN HARVESTED ENDOSCOPICALLY;  Surgeon: Rexene Alberts, MD;  Location: [place];  Service: Open Heart Surgery;  Laterality: N/A;   MITRAL VALVE REPAIR N/A 04/10/2018   Procedure: MITRAL VALVE REPAIR (MVR) USING 28 MEMO 4D;  Surgeon: Rexene Alberts, MD;  Location: [place];  Service: Open Heart Surgery;  Laterality: N/A;   RIGHT/LEFT HEART CATH AND CORONARY ANGIOGRAPHY N/A 02/06/2018   Procedure: RIGHT/LEFT HEART CATH AND CORONARY ANGIOGRAPHY;  Surgeon: Nigel Mormon, MD;  Location: [place] [facility];  Service: Cardiovascular;  Laterality: N/A;   s/p multiple breast biopsy     negative   TEE WITHOUT CARDIOVERSION N/A 02/06/2018   Procedure: TRANSESOPHAGEAL ECHOCARDIOGRAM (TEE);  Surgeon: Nigel Mormon, MD;  Location: [facility] ENDOSCOPY;  Service: Cardiovascular;  Laterality: N/A;   TEE WITHOUT CARDIOVERSION N/A 04/10/2018   Procedure: TRANSESOPHAGEAL  ECHOCARDIOGRAM (TEE);  Surgeon: Rexene Alberts, MD;  Location: [place];  Service: Open Heart Surgery;  Laterality: N/A;    reports that she has never smoked. She has never used smokeless tobacco. She reports that she does not drink alcohol and does not use drugs. family history includes Breast cancer in her cousin; Breast cancer (age of onset: 71) in her sister; Cancer in her maternal uncle and paternal uncle; Colon cancer in her brother and another family member; Heart disease in her father; Heart failure in her father; Hypertension in an other family member; Transient  ischemic attack in an other family member. No Known Allergies Current Outpatient Medications on File Prior to Visit  Medication Sig Dispense Refill   aspirin 81 MG EC tablet Take 81 mg by mouth daily.     atorvastatin (LIPITOR) 20 MG tablet Take 1 tablet (20 mg total) by mouth daily. 30 tablet 3   metoprolol succinate (TOPROL-XL) 50 MG 24 hr tablet TAKE 1 TABLET(50 MG) BY MOUTH DAILY 90 tablet 0   sodium chloride (OCEAN) 0.65 % SOLN nasal spray Place 1 spray into both nostrils as needed for congestion.     amoxicillin (AMOXIL) 500 MG capsule Take 4 capsules (2,000 mg total) by mouth as directed. (Patient not taking: Reported on 03/09/2021) 10 capsule 3   cetirizine (ZYRTEC) 10 MG tablet Take 1 tablet (10 mg total) by mouth daily. 30 tablet 11   furosemide (LASIX) 20 MG tablet Take 1 tablet (20 mg total) by mouth daily as needed. 30 tablet 2   No current facility-administered medications on file prior to visit.        ROS:  All others reviewed and negative.  Objective        PE:  BP 122/78 (BP Location: Left Arm, Patient Position: Sitting, Cuff Size: Normal)    Pulse 71    Temp 97.7 F (36.5 C) (Oral)    Wt 183 lb (83 kg)    SpO2 96%    BMI 30.45 kg/m                 Constitutional: Pt appears in NAD               HENT: Head: NCAT.                Right Ear: External ear normal.                 Left Ear: External ear normal.                Eyes: . Pupils are equal, round, and reactive to light. Conjunctivae and EOM are normal               Nose: without d/c or deformity               Neck: Neck supple. Gross normal ROM               Cardiovascular: Normal rate and regular rhythm.                 Pulmonary/Chest: Effort normal and breath sounds without rales or wheezing.                Abd:  Soft, NT, ND, + BS, no organomegaly               Neurological: Pt is alert. At baseline orientation, motor grossly intact  Skin: Skin is warm. No rashes, no other new lesions, LE edema  - none               Psychiatric: Pt behavior is normal without agitation but adamant about persons in her home and car noises  Micro: none  Cardiac tracings I have personally interpreted today:  none  Pertinent Radiological findings (summarize): none   Lab Results  Component Value Date   WBC 2.9 (L) 05/27/2020   HGB 12.5 05/27/2020   HCT 36.2 05/27/2020   PLT 144.0 (L) 05/27/2020   GLUCOSE 98 03/09/2021   CHOL 139 03/09/2021   TRIG 58 03/09/2021   HDL 61 03/09/2021   LDLCALC 66 03/09/2021   ALT 33 (H) 03/09/2021   AST 44 (H) 03/09/2021   NA 138 03/09/2021   K 4.1 03/09/2021   CL 104 03/09/2021   CREATININE 0.76 03/09/2021   BUN 11 03/09/2021   CO2 24 03/09/2021   TSH 0.39 05/27/2020   INR 2.4 08/06/2018   HGBA1C 5.9 05/27/2020   Assessment/Plan:  Tami Wood is a 70 y.o. Black or African American [2] female with  has a past medical history of ALLERGIC RHINITIS (06/18/2009), ANEMIA-IRON DEFICIENCY (11/28/2006), ANXIETY (11/28/2006), Arthritis, Coronary artery disease, Cough (06/18/2009), DEPRESSION (11/28/2006), GERD (12/10/2007), History of kidney stones, HYPERTENSION (11/28/2006), Hypertension, Mitral regurgitation, MURMUR (11/28/2006), PEPTIC ULCER DISEASE (12/10/2007), S/P CABG x 2 (04/10/2018), and S/P mitral valve repair (04/10/2018).  Vitamin D deficiency Last vitamin D Lab Results  Component Value Date   VD25OH 23.23 (L) 05/27/2020   Low, to start oral replacement   Hallucinations Difficult situation as pt does not believe there is a problem; declines further f/u with psychiatry or further med tx  Delusion [facility]) Difficult situation as pt does not believe there is a problem; declines further f/u with psychiatry or further med tx  Headache ? Clinical significance  - for xray today, and CT head, refer neurology per sister request  Followup: Return in about 3 months (around 06/07/2021).  Cathlean Cower, MD 03/14/2021 6:03 AM [facility]

## 2021-03-09 NOTE — Patient Instructions (Addendum)
Your blood work was done recently per cardiology  Please continue all other medications as before, and refills have been done if requested.  Please have the pharmacy call with any other refills you may need.  Please continue your efforts at being more active, low cholesterol diet, and weight control.  Please keep your appointments with your specialists as you may have planned  You will be contacted regarding the referral for: CT scan of the head, and neurology  Please go to the XRAY Department in the first floor for the x-ray testing   You will be contacted by phone if any changes need to be made immediately.  Otherwise, you will receive a letter about your results with an explanation, but please check with MyChart first.  Please remember to sign up for MyChart if you have not done so, as this will be important to you in the future with finding out test results, communicating by private email, and scheduling acute appointments online when needed.  Please make an Appointment to return in 3 months, or sooner if needed

## 2021-03-10 ENCOUNTER — Encounter: Payer: Self-pay | Admitting: Internal Medicine

## 2021-03-10 LAB — COMPREHENSIVE METABOLIC PANEL
ALT: 33 IU/L — ABNORMAL HIGH (ref 0–32)
AST: 44 IU/L — ABNORMAL HIGH (ref 0–40)
Albumin/Globulin Ratio: 0.8 — ABNORMAL LOW (ref 1.2–2.2)
Albumin: 3.7 g/dL (ref 3.7–4.7)
Alkaline Phosphatase: 162 IU/L — ABNORMAL HIGH (ref 44–121)
BUN/Creatinine Ratio: 14 (ref 12–28)
BUN: 11 mg/dL (ref 8–27)
Bilirubin Total: 0.5 mg/dL (ref 0.0–1.2)
CO2: 24 mmol/L (ref 20–29)
Calcium: 8.9 mg/dL (ref 8.7–10.3)
Chloride: 104 mmol/L (ref 96–106)
Creatinine, Ser: 0.76 mg/dL (ref 0.57–1.00)
Globulin, Total: 4.4 g/dL (ref 1.5–4.5)
Glucose: 98 mg/dL (ref 70–99)
Potassium: 4.1 mmol/L (ref 3.5–5.2)
Sodium: 138 mmol/L (ref 134–144)
Total Protein: 8.1 g/dL (ref 6.0–8.5)
eGFR: 83 mL/min/{1.73_m2} (ref >59)

## 2021-03-10 LAB — LIPID PANEL
Chol/HDL Ratio: 2.3 ratio (ref 0.0–4.4)
Cholesterol, Total: 139 mg/dL (ref 100–199)
HDL: 61 mg/dL (ref >39)
LDL Chol Calc (NIH): 66 mg/dL (ref 0–99)
Triglycerides: 58 mg/dL (ref 0–149)
VLDL Cholesterol Cal: 12 mg/dL (ref 5–40)

## 2021-03-11 ENCOUNTER — Telehealth: Payer: Self-pay | Admitting: Internal Medicine

## 2021-03-11 NOTE — Telephone Encounter (Signed)
Patient requesting a call back for ct scan results from 12-13

## 2021-03-11 NOTE — Telephone Encounter (Signed)
Patient would like clarification of CT results

## 2021-03-11 NOTE — Telephone Encounter (Signed)
Pt has not had any a CT scan that I am aware

## 2021-03-14 ENCOUNTER — Encounter: Payer: Self-pay | Admitting: Internal Medicine

## 2021-03-14 NOTE — Assessment & Plan Note (Signed)
Difficult situation as pt does not believe there is a problem; declines further f/u with psychiatry or further med tx

## 2021-03-14 NOTE — Assessment & Plan Note (Signed)
?   Clinical significance  - for xray today, and CT head, refer neurology per sister request

## 2021-03-18 ENCOUNTER — Encounter: Payer: Self-pay | Admitting: Neurology

## 2021-03-24 ENCOUNTER — Encounter: Payer: Self-pay | Admitting: Cardiology

## 2021-03-24 ENCOUNTER — Ambulatory Visit: Payer: Medicare Other | Admitting: Cardiology

## 2021-03-24 ENCOUNTER — Other Ambulatory Visit: Payer: Self-pay

## 2021-03-24 VITALS — BP 121/74 | HR 64 | Temp 98.0°F | Resp 16 | Ht 65.0 in | Wt 178.0 lb

## 2021-03-24 DIAGNOSIS — I10 Essential (primary) hypertension: Secondary | ICD-10-CM | POA: Diagnosis not present

## 2021-03-24 DIAGNOSIS — I25810 Atherosclerosis of coronary artery bypass graft(s) without angina pectoris: Secondary | ICD-10-CM | POA: Diagnosis not present

## 2021-03-24 DIAGNOSIS — Z9889 Other specified postprocedural states: Secondary | ICD-10-CM

## 2021-03-24 DIAGNOSIS — I5022 Chronic systolic (congestive) heart failure: Secondary | ICD-10-CM | POA: Diagnosis not present

## 2021-03-24 MED ORDER — SPIRONOLACTONE 25 MG PO TABS: 12.5000 mg | ORAL_TABLET | Freq: Every day | ORAL | 3 refills | Status: DC

## 2021-03-24 NOTE — Progress Notes (Signed)
Subjective:   Tami Wood, female    DOB: 06-Dec-1950, 70 y.o.   MRN: [id number]    Chief Complaint  Patient presents with   Congestive Heart Failure   Follow-up    3 month   Results    lab    HPI  70 y/o [place] American female with mitral valve prolapse, CAD, now s/p mitral valve repair, CABGX2 (03/2018 by Dr. 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  Patient has noticed leg swelling in the last couple of days.  She has had to take up to 3 Lasix in a day, with which she has had profound urine output.  She denies any significant exertional dyspnea, chest pain.  On a separate note, reviewed recent lipid panel and LFT results, details below.    Current Outpatient Medications on File Prior to Visit  Medication Sig Dispense Refill   amoxicillin (AMOXIL) 500 MG capsule Take 4 capsules (2,000 mg total) by mouth as directed. (Patient not taking: Reported on 03/09/2021) 10 capsule 3   aspirin 81 MG EC tablet Take 81 mg by mouth daily.     atorvastatin (LIPITOR) 20 MG tablet Take 1 tablet (20 mg total) by mouth daily. 30 tablet 3   cetirizine (ZYRTEC) 10 MG tablet Take 1 tablet (10 mg total) by mouth daily. 30 tablet 11   furosemide (LASIX) 20 MG tablet Take 1 tablet (20 mg total) by mouth daily as needed. 30 tablet 2   metoprolol succinate (TOPROL-XL) 50 MG 24 hr tablet TAKE 1 TABLET(50 MG) BY MOUTH DAILY 90 tablet 0   sacubitril-valsartan (ENTRESTO) 49-51 MG Take 1 tablet by mouth 2 (two) times daily. 180 tablet 0   sodium chloride (OCEAN) 0.65 % SOLN nasal spray Place 1 spray into both nostrils as needed for congestion.     No current facility-administered medications on file prior to visit.    Cardiovascular studies:  EKG 12/18/2020: Sinus rhythm 68 bpm  Diffuse nonspecific T-abnormality  Echocardiogram 10/30/2020:  Mildly depressed LV systolic function with visual EF 45-50%. Left  ventricle cavity is normal in size. Mild left  ventricular hypertrophy.  Normal global wall motion. Elevated LAP based on E/e' and IAS bows from  left to right. Unable to evaluate diastolic function due to mitral valve  repair.  Mitral valve ring is well seated and without evidence of dehiscence.    Posterior leaflet is restricted and anterior leaflet is thickened with  appropriate excursion.  No evidence of mitral stenosis. At the heart rate  of 62bpm E wave velocity 1.59ms, TVI ratio 3.2, PHT 1258mc, Mean  gradient 2.61m68m.  Mild (Grade I) mitral regurgitation.  Mild tricuspid regurgitation. No evidence of pulmonary hypertension.  Compared to study 06/04/2019 no significant change.    Echocardiogram 06/04/2019:  Mildly depressed LV systolic function with visual EF 45-50%. Left  ventricle cavity is normal in size. Mild left ventricular hypertrophy.  Normal global wall motion. Unable to evaluate diastolic function due to  mitral valve repair. Left atrial cavity is severely dilated.  Interatrial septum bulges to the  right suggests elevated left atrial pressure.  Mitral valve ring is well seated and without evidence of dehiscence.  Posterior leaflet is restricted and anterior leaflet is thickened with  appropriate excursion.  No evidence of mitral stenosis. At the heart rate  of 54bpm E wave velocity 1.34m/434mTVI ratio 3.1, PHT 1234ms534mMean gradient  3.4 mmHg. Mild mitral regurgitation.  Mild tricuspid regurgitation. No evidence  of pulmonary hypertension.  Prior study dated 12/01/2018 noted LVEF 35-40%, moderately dilated LA,  Mild mitral stenosis (PG 11.87mhg, MG 3.921mg, calculated MVA 1.8cm2),  Mild TR.   48 hr Holter monitor 03/15/2019:  Dominant rhythm sinus.  HR 45-98 bpm. Avg HR 68 bpm.  No atrial fibrillation/atrial flutter/SVT/VT/high grade AV block, sinus pause >3sec noted.  Rare PAC/PVC seen.  Symptoms reported: None   Procedure 04/10/2018:  (Dr. OwRoxy Manns  Mitral Valve Repair             Complex valvuloplasty  including artificial Gore-tex neochord placement x8             Sorin Memo 4D ring annuloplasty (size 2821mcatalog #4DM-28, serial #[id number] Coronary Artery Bypass Grafting x 2              Left Internal Mammary Artery to Distal Left Anterior Descending Coronary Artery             Saphenous Vein Graft to Posterior Descending Coronary Artery             Endoscopic Vein Harvest from Right Thigh    Carotid [place]/12/2018: Right Carotid: Velocities in the right ICA are consistent with a 1-39% stenosis.   Left Carotid: Velocities in the left ICA are consistent with a 1-39% stenosis. Vertebrals: Left vertebral artery demonstrates antegrade flow. Right vertebral             artery demonstrates bidirectional flow.  Recent labs: 03/09/2021: Glucose 98, BUN/Cr 11/0.76. EGFR 83. Na/K 138/4.1. AST/ALT 44/33. ALKP 162. Rest of the CMP normal Chol 139, TG 58, HDL 61, LDL 66  12/04/2020: Glucose 105, BUN/Cr 10/0.72. EGFR 89. Na/K 139/4.2. AST/ALT 45/43. ALKP 192. Mildly elevated. Rest of the CMP normal  05/27/2020: Glucose 120, BUN/Cr 13/0.67. EGFR 87. Na/K 138/4.1. AlKP 121. AST/ALT 47/48. Total protein 8.4 H/H 12.5/36.2. MCV 91. Platelets 144 HbA1C 5.9% Chol 103, TG 89, HDL 43, LDL 42 TSH 0.39 normal  07/10/2019: Glucose 98, BUN/Cr 9/0.77. EGFR >60. Na/K 140/4.1. AlKp 144, total protein 8.5. Rest of the CMP normal H/H 11.9/36.9. MCV 93. Platelets 156 TSH 0.6 normal (02/2019)  02/05/2019: Glucose 84, BUN/Cr 9/0.66. EGFR 104. Na/K 140/4.2. Rest of the CMP normal H/H 11.7/35.7. MCV 91. Platelets 171 (10/2018) Chol 152, TG 90, HDL 51, LDL 83 (07/2017) TSH normal (03/2018)   Review of Systems  Constitutional: Positive for weight gain.  Cardiovascular:  Positive for leg swelling. Negative for chest pain, dyspnea on exertion, palpitations and syncope.  Psychiatric/Behavioral:         Delusions about neighbors spying on her      Vitals:   03/24/21 0943  BP: 121/74  Pulse: 64  Resp: 16   Temp: 98 F (36.7 C)  SpO2: 96%     Objective:   Physical Exam Vitals and nursing note reviewed.  Constitutional:      General: She is not in acute distress. Neck:     Vascular: No JVD.  Cardiovascular:     Rate and Rhythm: Normal rate and regular rhythm.     Heart sounds: Normal heart sounds. No murmur heard. Pulmonary:     Effort: Pulmonary effort is normal.     Breath sounds: Normal breath sounds. No wheezing or rales.  Musculoskeletal:     Right lower leg: Edema (Trace) present.     Left lower leg: Edema (Trace) present.         Assessment & Recommendations:   [age]o [place] female  with mitral valve prolapse, CAD, now s/p mitral valve repair, CABGX2 (03/2018 by Dr. 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   HFrEF: EF 45 to 50% (10/2020) Mild leg edema today. Added spironolactone 12.5 mg daily.  Hopefully, but this, as needed Lasix usage will reduce. Check BMP and NT proBNP in 1 week. Continue metoprolol succinate to 50 mg daily, Entresto to 49-51 mg bid.  Follow-up in 2 weeks.  Elevated liver enzymes: Remain elevated on Lipitor 20 mg daily.  Consider GI work-up, defer to PCP.  S/P mitral valve repair: On Aspirin 81 mg daily.  Recommend endocarditis prophylaxis for dental procedures.  Essential hypertension: Controlled.  CAD: No angina symptoms. Continue aspirin 81 mg daily. Lipids well controlled on Lipitor 20 mg daily. Dose was reduced due to elevated liver enzymes.   Delusions: Persist.  Defer management to PCP/psychiatry  F/u in 2 weeks   Tami Encina Esther Hardy, MD [facility] [place] Pager: [phone] Office: [phone] If no answer Cell [phone]

## 2021-03-31 ENCOUNTER — Other Ambulatory Visit (HOSPITAL_COMMUNITY): Payer: Self-pay | Admitting: Cardiology

## 2021-03-31 DIAGNOSIS — I5022 Chronic systolic (congestive) heart failure: Secondary | ICD-10-CM | POA: Diagnosis not present

## 2021-04-01 LAB — BASIC METABOLIC PANEL
BUN/Creatinine Ratio: 14 (ref 12–28)
BUN: 11 mg/dL (ref 8–27)
CO2: 23 mmol/L (ref 20–29)
Calcium: 8.8 mg/dL (ref 8.7–10.3)
Chloride: 103 mmol/L (ref 96–106)
Creatinine, Ser: 0.76 mg/dL (ref 0.57–1.00)
Glucose: 148 mg/dL — ABNORMAL HIGH (ref 70–99)
Potassium: 4.6 mmol/L (ref 3.5–5.2)
Sodium: 135 mmol/L (ref 134–144)
eGFR: 83 mL/min/{1.73_m2} (ref >59)

## 2021-04-01 LAB — PRO B NATRIURETIC PEPTIDE: NT-Pro BNP: 377 pg/mL — ABNORMAL HIGH (ref 0–301)

## 2021-04-05 DIAGNOSIS — Z86000 Personal history of in-situ neoplasm of breast: Secondary | ICD-10-CM | POA: Diagnosis not present

## 2021-04-12 ENCOUNTER — Other Ambulatory Visit: Payer: Self-pay

## 2021-04-12 ENCOUNTER — Ambulatory Visit: Payer: Medicare Other | Admitting: Cardiology

## 2021-04-12 ENCOUNTER — Encounter: Payer: Self-pay | Admitting: Cardiology

## 2021-04-12 VITALS — BP 137/82 | HR 82 | Temp 98.0°F | Resp 16 | Ht 65.0 in | Wt 172.0 lb

## 2021-04-12 DIAGNOSIS — Z9889 Other specified postprocedural states: Secondary | ICD-10-CM

## 2021-04-12 DIAGNOSIS — R443 Hallucinations, unspecified: Secondary | ICD-10-CM | POA: Diagnosis not present

## 2021-04-12 DIAGNOSIS — I25810 Atherosclerosis of coronary artery bypass graft(s) without angina pectoris: Secondary | ICD-10-CM | POA: Diagnosis not present

## 2021-04-12 DIAGNOSIS — I5022 Chronic systolic (congestive) heart failure: Secondary | ICD-10-CM | POA: Diagnosis not present

## 2021-04-12 NOTE — Progress Notes (Signed)
Subjective:   Tami Wood, female    DOB: 30-Jun-1950, 71 y.o.   MRN: [id number]    Chief Complaint  Patient presents with   Edema    legs   Follow-up    2 weeks    HPI  71 y/o [place] American female with mitral valve prolapse, CAD, now s/p mitral valve repair, CABGX2 (03/2018 by Dr. 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  Leg swelling has improved. Dyspnea is no worse than usual.   Patient remains extremely concerned about "people that are trying to kill her". She remains adamant that her sisters-who are trying to get her psychiatric help, don't believe her.    Current Outpatient Medications on File Prior to Visit  Medication Sig Dispense Refill   amoxicillin (AMOXIL) 500 MG capsule Take 4 capsules (2,000 mg total) by mouth as directed. 10 capsule 3   aspirin 81 MG EC tablet Take 81 mg by mouth daily.     atorvastatin (LIPITOR) 20 MG tablet Take 1 tablet (20 mg total) by mouth daily. 30 tablet 3   cetirizine (ZYRTEC) 10 MG tablet Take 1 tablet (10 mg total) by mouth daily. 30 tablet 11   furosemide (LASIX) 20 MG tablet Take 1 tablet (20 mg total) by mouth daily as needed. 30 tablet 2   metoprolol succinate (TOPROL-XL) 50 MG 24 hr tablet TAKE 1 TABLET(50 MG) BY MOUTH DAILY 90 tablet 0   sacubitril-valsartan (ENTRESTO) 49-51 MG Take 1 tablet by mouth 2 (two) times daily. 180 tablet 0   sodium chloride (OCEAN) 0.65 % SOLN nasal spray Place 1 spray into both nostrils as needed for congestion.     spironolactone (ALDACTONE) 25 MG tablet Take 0.5 tablets (12.5 mg total) by mouth daily. 30 tablet 3   No current facility-administered medications on file prior to visit.    Cardiovascular studies:  EKG 12/18/2020: Sinus rhythm 68 bpm  Diffuse nonspecific T-abnormality  Echocardiogram 10/30/2020:  Mildly depressed LV systolic function with visual EF 45-50%. Left  ventricle cavity is normal in size. Mild left ventricular hypertrophy.   Normal global wall motion. Elevated LAP based on E/e' and IAS bows from  left to right. Unable to evaluate diastolic function due to mitral valve  repair.  Mitral valve ring is well seated and without evidence of dehiscence.    Posterior leaflet is restricted and anterior leaflet is thickened with  appropriate excursion.  No evidence of mitral stenosis. At the heart rate  of 62bpm E wave velocity 1.63ms, TVI ratio 3.2, PHT 12846mc, Mean  gradient 2.46m1m.  Mild (Grade I) mitral regurgitation.  Mild tricuspid regurgitation. No evidence of pulmonary hypertension.  Compared to study 06/04/2019 no significant change.    48 hr Holter monitor 03/15/2019:  Dominant rhythm sinus.  HR 45-98 bpm. Avg HR 68 bpm.  No atrial fibrillation/atrial flutter/SVT/VT/high grade AV block, sinus pause >3sec noted.  Rare PAC/PVC seen.  Symptoms reported: None   Procedure 04/10/2018:  (Dr. OweRoxy Manns Mitral Valve Repair             Complex valvuloplasty including artificial Gore-tex neochord placement x8             Sorin Memo 4D ring annuloplasty (size [id number] #4DM-28, serial #[id number] Artery Bypass Grafting x 2              Left Internal Mammary Artery to Distal Left Anterior Descending Coronary Artery  Saphenous Vein Graft to Posterior Descending Coronary Artery             Endoscopic Vein Harvest from Right Thigh    Carotid US 04/06/2018: Right Carotid: Velocities in the right ICA are consistent with a 1-39% stenosis.   Left Carotid: Velocities in the left ICA are consistent with a 1-39% stenosis. Vertebrals: Left vertebral artery demonstrates antegrade flow. Right vertebral             artery demonstrates bidirectional flow.  Recent labs: 03/31/2021: Glucose 148, BUN/Cr 11/0.76. EGFR 83. Na/K 135/4.6 NT proBNP 377 high (0-301)  03/09/2021: Glucose 98, BUN/Cr 11/0.76. EGFR 83. Na/K 138/4.1. AST/ALT 44/33. ALKP 162. Rest of the CMP normal Chol 139, TG 58, HDL 61, LDL  66  12/04/2020: Glucose 105, BUN/Cr 10/0.72. EGFR 89. Na/K 139/4.2. AST/ALT 45/43. ALKP 192. Mildly elevated. Rest of the CMP normal  05/27/2020: Glucose 120, BUN/Cr 13/0.67. EGFR 87. Na/K 138/4.1. AlKP 121. AST/ALT 47/48. Total protein 8.4 H/H 12.5/36.2. MCV 91. Platelets 144 HbA1C 5.9% Chol 103, TG 89, HDL 43, LDL 42 TSH 0.39 normal  07/10/2019: Glucose 98, BUN/Cr 9/0.77. EGFR >60. Na/K 140/4.1. AlKp 144, total protein 8.5. Rest of the CMP normal H/H 11.9/36.9. MCV 93. Platelets 156 TSH 0.6 normal (02/2019)  02/05/2019: Glucose 84, BUN/Cr 9/0.66. EGFR 104. Na/K 140/4.2. Rest of the CMP normal H/H 11.7/35.7. MCV 91. Platelets 171 (10/2018) Chol 152, TG 90, HDL 51, LDL 83 (07/2017) TSH normal (03/2018)   Review of Systems  Constitutional: Negative for weight gain.  Cardiovascular:  Negative for chest pain, dyspnea on exertion, leg swelling, palpitations and syncope.  Psychiatric/Behavioral:         Delusions about neighbors spying on her      Vitals:   04/12/21 1351  BP: 137/82  Pulse: 82  Resp: 16  Temp: 98 F (36.7 C)  SpO2: 97%     Objective:   Physical Exam Vitals and nursing note reviewed.  Constitutional:      General: She is not in acute distress. Neck:     Vascular: No JVD.  Cardiovascular:     Rate and Rhythm: Normal rate and regular rhythm.     Heart sounds: Normal heart sounds. No murmur heard. Pulmonary:     Effort: Pulmonary effort is normal.     Breath sounds: Normal breath sounds. No wheezing or rales.  Musculoskeletal:     Right lower leg: No edema.     Left lower leg: No edema.         Assessment & Recommendations:   71 y/o [place] American female with mitral valve prolapse, CAD, now s/p mitral valve repair, CABGX2 (03/2018 by Dr. 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   HFrEF: EF 45 to 50% (10/2020) Leg edema resolved with addition of spironolactone 12.5 mg daily.  Lasix as needed. NT pro BNP  377 prior to spironolactone, probably would decrease now. Continue metoprolol succinate to 50 mg daily, Entresto to 49-51 mg bid.   Elevated liver enzymes: Remain elevated on Lipitor 20 mg daily.  Consider GI work-up, defer to PCP.  S/P mitral valve repair: On Aspirin 81 mg daily.  Recommend endocarditis prophylaxis for dental procedures.  Essential hypertension: Controlled.  CAD: No angina symptoms. Continue aspirin 81 mg daily. Lipids well controlled on Lipitor 20 mg daily. Dose was reduced due to elevated liver enzymes.  Delusions/hallucination: Fixed delusion/hallucination about people spying on her and trying to kill her. I tried to reason  with her that she has not been physically harmed, but she was not convinced.  F/u in 6 months  Nakhia Levitan Esther Hardy, MD [facility] [place] Pager: [phone] Office: [phone] If no answer Cell [phone]

## 2021-04-13 ENCOUNTER — Ambulatory Visit
Admission: RE | Admit: 2021-04-13 | Discharge: 2021-04-13 | Disposition: A | Discharge status: Home / Self Care | Payer: Medicare Other | Source: Ambulatory Visit | Attending: Internal Medicine | Admitting: Internal Medicine

## 2021-04-13 DIAGNOSIS — R519 Headache, unspecified: Secondary | ICD-10-CM

## 2021-04-13 DIAGNOSIS — F22 Delusional disorders: Secondary | ICD-10-CM

## 2021-04-13 DIAGNOSIS — R443 Hallucinations, unspecified: Secondary | ICD-10-CM

## 2021-04-13 DIAGNOSIS — G319 Degenerative disease of nervous system, unspecified: Secondary | ICD-10-CM | POA: Diagnosis not present

## 2021-04-13 DIAGNOSIS — I639 Cerebral infarction, unspecified: Secondary | ICD-10-CM | POA: Diagnosis not present

## 2021-04-14 ENCOUNTER — Encounter: Payer: Self-pay | Admitting: Internal Medicine

## 2021-05-17 ENCOUNTER — Other Ambulatory Visit: Payer: Self-pay | Admitting: Pharmacist

## 2021-05-17 DIAGNOSIS — I10 Essential (primary) hypertension: Secondary | ICD-10-CM

## 2021-05-17 DIAGNOSIS — I5022 Chronic systolic (congestive) heart failure: Secondary | ICD-10-CM

## 2021-05-17 MED ORDER — ENTRESTO 49-51 MG PO TABS: 1.0000 | ORAL_TABLET | Freq: Two times a day (BID) | ORAL | 3 refills | Status: DC

## 2021-05-24 ENCOUNTER — Other Ambulatory Visit: Payer: Self-pay | Admitting: Pharmacist

## 2021-05-24 DIAGNOSIS — I10 Essential (primary) hypertension: Secondary | ICD-10-CM

## 2021-05-24 DIAGNOSIS — I5022 Chronic systolic (congestive) heart failure: Secondary | ICD-10-CM

## 2021-05-24 MED ORDER — ENTRESTO 49-51 MG PO TABS: 1.0000 | ORAL_TABLET | Freq: Two times a day (BID) | ORAL | 3 refills | Status: DC

## 2021-05-30 ENCOUNTER — Other Ambulatory Visit: Payer: Self-pay | Admitting: Cardiology

## 2021-05-30 DIAGNOSIS — I5022 Chronic systolic (congestive) heart failure: Secondary | ICD-10-CM

## 2021-06-04 ENCOUNTER — Other Ambulatory Visit: Payer: Self-pay

## 2021-06-04 ENCOUNTER — Encounter (HOSPITAL_COMMUNITY): Payer: Self-pay | Admitting: *Deleted

## 2021-06-04 ENCOUNTER — Emergency Department (HOSPITAL_COMMUNITY): Payer: Medicare Other

## 2021-06-04 ENCOUNTER — Emergency Department (HOSPITAL_COMMUNITY)
Admission: EM | Admit: 2021-06-04 | Discharge: 2021-06-04 | Disposition: A | Discharge status: Home / Self Care | Payer: Medicare Other | Attending: Emergency Medicine | Admitting: Emergency Medicine

## 2021-06-04 DIAGNOSIS — Z7982 Long term (current) use of aspirin: Secondary | ICD-10-CM | POA: Insufficient documentation

## 2021-06-04 DIAGNOSIS — R079 Chest pain, unspecified: Secondary | ICD-10-CM | POA: Diagnosis not present

## 2021-06-04 DIAGNOSIS — I7121 Aneurysm of the ascending aorta, without rupture: Secondary | ICD-10-CM | POA: Insufficient documentation

## 2021-06-04 DIAGNOSIS — I10 Essential (primary) hypertension: Secondary | ICD-10-CM | POA: Diagnosis not present

## 2021-06-04 DIAGNOSIS — R519 Headache, unspecified: Secondary | ICD-10-CM | POA: Insufficient documentation

## 2021-06-04 DIAGNOSIS — M791 Myalgia, unspecified site: Secondary | ICD-10-CM | POA: Insufficient documentation

## 2021-06-04 DIAGNOSIS — S301XXA Contusion of abdominal wall, initial encounter: Secondary | ICD-10-CM | POA: Insufficient documentation

## 2021-06-04 DIAGNOSIS — R109 Unspecified abdominal pain: Secondary | ICD-10-CM | POA: Diagnosis not present

## 2021-06-04 DIAGNOSIS — Y92410 Unspecified street and highway as the place of occurrence of the external cause: Secondary | ICD-10-CM | POA: Diagnosis not present

## 2021-06-04 LAB — CBC WITH DIFFERENTIAL/PLATELET
Abs Immature Granulocytes: 0.01 10*3/uL (ref 0.00–0.07)
Basophils Absolute: 0 10*3/uL (ref 0.0–0.1)
Basophils Relative: 1 %
Eosinophils Absolute: 0 10*3/uL (ref 0.0–0.5)
Eosinophils Relative: 0 %
HCT: 36.7 % (ref 36.0–46.0)
Hemoglobin: 12.4 g/dL (ref 12.0–15.0)
Immature Granulocytes: 0 %
Lymphocytes Relative: 33 %
Lymphs Abs: 1.3 10*3/uL (ref 0.7–4.0)
MCH: 31.7 pg (ref 26.0–34.0)
MCHC: 33.8 g/dL (ref 30.0–36.0)
MCV: 93.9 fL (ref 80.0–100.0)
Monocytes Absolute: 0.5 10*3/uL (ref 0.1–1.0)
Monocytes Relative: 11 %
Neutro Abs: 2.3 10*3/uL (ref 1.7–7.7)
Neutrophils Relative %: 55 %
Platelets: 135 10*3/uL — ABNORMAL LOW (ref 150–400)
RBC: 3.91 MIL/uL (ref 3.87–5.11)
RDW: 12.9 % (ref 11.5–15.5)
WBC: 4.1 10*3/uL (ref 4.0–10.5)
nRBC: 0 % (ref 0.0–0.2)

## 2021-06-04 LAB — COMPREHENSIVE METABOLIC PANEL
ALT: 39 U/L (ref 0–44)
AST: 54 U/L — ABNORMAL HIGH (ref 15–41)
Albumin: 3.4 g/dL — ABNORMAL LOW (ref 3.5–5.0)
Alkaline Phosphatase: 80 U/L (ref 38–126)
Anion gap: 5 (ref 5–15)
BUN: 8 mg/dL (ref 8–23)
CO2: 27 mmol/L (ref 22–32)
Calcium: 9 mg/dL (ref 8.9–10.3)
Chloride: 104 mmol/L (ref 98–111)
Creatinine, Ser: 0.65 mg/dL (ref 0.44–1.00)
GFR, Estimated: >60 mL/min (ref >60)
Glucose, Bld: 108 mg/dL — ABNORMAL HIGH (ref 70–99)
Potassium: 4.3 mmol/L (ref 3.5–5.1)
Sodium: 136 mmol/L (ref 135–145)
Total Bilirubin: 1 mg/dL (ref 0.3–1.2)
Total Protein: 8.2 g/dL — ABNORMAL HIGH (ref 6.5–8.1)

## 2021-06-04 MED ORDER — IOHEXOL 300 MG/ML  SOLN
100.0000 mL | Freq: Once | INTRAMUSCULAR | Status: AC | PRN
  Administered 2021-06-04: 100 mL via INTRAVENOUS

## 2021-06-04 NOTE — ED Notes (Signed)
Pt verbalized understanding of d/c instructions, meds, and followup care. Denies questions. VSS, no distress noted. Steady gait to exit with all belongings.  ?

## 2021-06-04 NOTE — Discharge Instructions (Addendum)
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 ? ?The cardiothoracic surgeon recommends that you follow-up in their office in 2 to 3 weeks.  Call the phone number provided to make this appointment. ? ?Return back to the ER if you have pain difficulty breathing fevers or any additional concerns. ?
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
## 2021-06-04 NOTE — ED Notes (Signed)
Patient transported to CT 
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
## 2021-06-07 ENCOUNTER — Other Ambulatory Visit: Payer: Self-pay | Admitting: Surgery

## 2021-06-07 DIAGNOSIS — I729 Aneurysm of unspecified site: Secondary | ICD-10-CM
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
## 2021-06-15 ENCOUNTER — Ambulatory Visit (INDEPENDENT_AMBULATORY_CARE_PROVIDER_SITE_OTHER): Payer: Medicare Other | Admitting: Neurology

## 2021-06-15 ENCOUNTER — Encounter: Payer: Self-pay | Admitting: Neurology

## 2021-06-15 ENCOUNTER — Other Ambulatory Visit: Payer: Self-pay

## 2021-06-15 VITALS — BP 142/87 | HR 67 | Ht 65.0 in | Wt 166.0 lb

## 2021-06-15 DIAGNOSIS — F22 Delusional disorders: Secondary | ICD-10-CM

## 2021-06-15 DIAGNOSIS — R4189 Other symptoms and signs involving cognitive functions and awareness: Secondary | ICD-10-CM

## 2021-06-15 DIAGNOSIS — I25810 Atherosclerosis of coronary artery bypass graft(s) without angina pectoris: Secondary | ICD-10-CM

## 2021-06-15 NOTE — Patient Instructions (Signed)
I recommend neurocognitive evaluation.  Follow up after testing ?

## 2021-06-16 ENCOUNTER — Ambulatory Visit (HOSPITAL_COMMUNITY)
Admission: RE | Admit: 2021-06-16 | Discharge: 2021-06-16 | Disposition: A | Discharge status: Home / Self Care | Payer: Medicare Other | Source: Ambulatory Visit | Attending: Surgery | Admitting: Surgery

## 2021-06-16 DIAGNOSIS — I729 Aneurysm of unspecified site: Secondary | ICD-10-CM | POA: Diagnosis not present

## 2021-06-16 DIAGNOSIS — I517 Cardiomegaly: Secondary | ICD-10-CM | POA: Diagnosis not present

## 2021-06-16 DIAGNOSIS — I77810 Thoracic aortic ectasia: Secondary | ICD-10-CM | POA: Diagnosis not present

## 2021-06-16 MED ORDER — IOHEXOL 350 MG/ML SOLN
80.0000 mL | Freq: Once | INTRAVENOUS | Status: AC | PRN
  Administered 2021-06-16: 80 mL via INTRAVENOUS

## 2021-06-18 ENCOUNTER — Other Ambulatory Visit: Payer: Self-pay

## 2021-06-18 ENCOUNTER — Institutional Professional Consult (permissible substitution) (INDEPENDENT_AMBULATORY_CARE_PROVIDER_SITE_OTHER): Payer: Medicare Other | Admitting: Surgery

## 2021-06-18 ENCOUNTER — Encounter: Payer: Self-pay | Admitting: Surgery

## 2021-06-18 VITALS — BP 122/72 | HR 69 | Resp 20 | Ht 65.0 in | Wt 166.0 lb

## 2021-06-18 DIAGNOSIS — I25810 Atherosclerosis of coronary artery bypass graft(s) without angina pectoris: Secondary | ICD-10-CM | POA: Diagnosis not present

## 2021-06-18 DIAGNOSIS — I729 Aneurysm of unspecified site: Secondary | ICD-10-CM | POA: Diagnosis not present
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
## 2021-08-04 ENCOUNTER — Other Ambulatory Visit: Payer: Self-pay | Admitting: Surgery

## 2021-08-04 DIAGNOSIS — I7100 Dissection of unspecified site of aorta: Secondary | ICD-10-CM

## 2021-08-05 DIAGNOSIS — Z20822 Contact with and (suspected) exposure to covid-19: Secondary | ICD-10-CM | POA: Diagnosis not present

## 2021-08-28 ENCOUNTER — Other Ambulatory Visit: Payer: Self-pay | Admitting: Cardiology

## 2021-08-28 DIAGNOSIS — I5022 Chronic systolic (congestive) heart failure: Secondary | ICD-10-CM

## 2021-09-24 ENCOUNTER — Inpatient Hospital Stay: Admission: RE | Admit: 2021-09-24 | Payer: Medicare Other | Source: Ambulatory Visit

## 2021-09-29 ENCOUNTER — Ambulatory Visit: Payer: Medicare Other | Admitting: Surgery

## 2021-09-30 ENCOUNTER — Encounter: Payer: Self-pay | Admitting: Surgery

## 2021-10-11 ENCOUNTER — Other Ambulatory Visit: Payer: Medicare Other

## 2021-10-19 ENCOUNTER — Other Ambulatory Visit: Payer: Medicare Other

## 2021-10-21 ENCOUNTER — Inpatient Hospital Stay: Admission: RE | Admit: 2021-10-21 | Payer: Medicare Other | Source: Ambulatory Visit

## 2021-10-21 ENCOUNTER — Ambulatory Visit: Payer: Medicare Other | Admitting: Cardiology

## 2021-10-28 ENCOUNTER — Ambulatory Visit: Payer: Medicare Other | Admitting: Cardiology

## 2021-12-02 ENCOUNTER — Other Ambulatory Visit: Payer: Self-pay

## 2021-12-02 DIAGNOSIS — I10 Essential (primary) hypertension: Secondary | ICD-10-CM

## 2021-12-02 DIAGNOSIS — I5022 Chronic systolic (congestive) heart failure: Secondary | ICD-10-CM

## 2021-12-02 MED ORDER — ENTRESTO 49-51 MG PO TABS: 1.0000 | ORAL_TABLET | Freq: Two times a day (BID) | ORAL | 3 refills | Status: DC

## 2021-12-03 ENCOUNTER — Ambulatory Visit: Payer: Medicare Other

## 2021-12-03 DIAGNOSIS — I5022 Chronic systolic (congestive) heart failure: Secondary | ICD-10-CM | POA: Diagnosis not present

## 2021-12-13 ENCOUNTER — Encounter: Payer: Self-pay | Admitting: Cardiology

## 2021-12-13 ENCOUNTER — Ambulatory Visit: Payer: Medicare Other | Admitting: Cardiology

## 2021-12-13 VITALS — BP 138/85 | HR 67 | Temp 98.0°F | Resp 16 | Ht 65.0 in | Wt 167.0 lb

## 2021-12-13 DIAGNOSIS — I5022 Chronic systolic (congestive) heart failure: Secondary | ICD-10-CM

## 2021-12-13 DIAGNOSIS — I25810 Atherosclerosis of coronary artery bypass graft(s) without angina pectoris: Secondary | ICD-10-CM

## 2021-12-13 DIAGNOSIS — R748 Abnormal levels of other serum enzymes: Secondary | ICD-10-CM

## 2021-12-13 NOTE — Progress Notes (Signed)
Subjective:   Tami Wood, female    DOB: Dec 26, 1950, 71 y.o.   MRN: [id number]    Chief Complaint  Patient presents with   Congestive Heart Failure   Follow-up    6 month   Results    echo    HPI  71 y/o [place] female with mitral valve prolapse, CAD, now s/p mitral valve repair, CABGX2 (03/2018 by Dr. 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  Patient is doing well from cardiac standpoint. She denies chest pain, shortness of breath, palpitations, leg edema, orthopnea, PND, TIA/syncope. She has not had any recent lab work. She is not currently taking statin.   Current Outpatient Medications:    amoxicillin (AMOXIL) 500 MG capsule, Take 4 capsules (2,000 mg total) by mouth as directed., Disp: 10 capsule, Rfl: 3   aspirin 81 MG EC tablet, Take 81 mg by mouth daily., Disp: , Rfl:    atorvastatin (LIPITOR) 20 MG tablet, Take 1 tablet (20 mg total) by mouth daily. (Patient not taking: Reported on 06/15/2021), Disp: 30 tablet, Rfl: 3   cetirizine (ZYRTEC) 10 MG tablet, Take 1 tablet (10 mg total) by mouth daily., Disp: 30 tablet, Rfl: 11   furosemide (LASIX) 20 MG tablet, Take 1 tablet (20 mg total) by mouth daily as needed., Disp: 30 tablet, Rfl: 2   metoprolol succinate (TOPROL-XL) 50 MG 24 hr tablet, TAKE 1 TABLET(50 MG) BY MOUTH DAILY, Disp: 90 tablet, Rfl: 0   sacubitril-valsartan (ENTRESTO) 49-51 MG, Take 1 tablet by mouth 2 (two) times daily., Disp: 180 tablet, Rfl: 3   sodium chloride (OCEAN) 0.65 % SOLN nasal spray, Place 1 spray into both nostrils as needed for congestion., Disp: , Rfl:    spironolactone (ALDACTONE) 25 MG tablet, Take 0.5 tablets (12.5 mg total) by mouth daily. (Patient not taking: Reported on 06/18/2021), Disp: 30 tablet, Rfl: 3    Cardiovascular studies:  EKG 12/13/2021: Sinus rhythm 66 bpm Nonspecific T wave abnormality Occasional ectopic ventricular beat    Echocardiogram  12/03/2021:  Low-normal LV systolic function with visual EF 50-55%. Left ventricle  cavity is normal in size. Mild concentric remodeling of the left  ventricle. Normal global wall motion. Elevated LAP based on E/e' and IAS  bows from left to right. Unable to evaluate diastolic function due to  mitral valve repair. Left atrial cavity is moderately dilated at 39.3 ml/m^2.  No evidence of mitral stenosis. Mild (Grade I) mitral regurgitation.  Mitral valve ring repair of the mitral annulus. Moderate mitral valve  leaflet thickening. Posterior leaflet is restricted and anterior leaflet  is thickened with appropriate excursion.  E-wave dominant mitral inflow.  Structurally normal tricuspid valve.  Mild tricuspid regurgitation. No  evidence of pulmonary hypertension.  No significant change compared to echo in 2022.   Procedure 04/10/2018:  (Dr. Roxy Manns)   Mitral Valve Repair             Complex valvuloplasty including artificial Gore-tex neochord placement x8             Sorin Memo 4D ring annuloplasty (size 26m, catalog #[id number], serial ##[id number]   Coronary Artery Bypass Grafting x 2              Left Internal Mammary Artery to Distal Left Anterior Descending Coronary Artery             Saphenous Vein Graft to Posterior Descending Coronary Artery  Endoscopic Vein Harvest from Right Thigh    Carotid US 04/06/2018: Right Carotid: Velocities in the right ICA are consistent with a 1-39% stenosis.   Left Carotid: Velocities in the left ICA are consistent with a 1-39% stenosis. Vertebrals: Left vertebral artery demonstrates antegrade flow. Right vertebral             artery demonstrates bidirectional flow.  Recent labs: 06/04/2021: Glucose 108, BUN/Cr 8/9.65. EGFR 136. Na/K 4.3/Albumin 3.4. Total protein 8.2. AST/ALT 54/39. Rest of the CMP normal H/H 12/36. MCV 93. Platelets 135  Review of Systems  Constitutional: Negative for weight gain.  Cardiovascular:  Negative for chest pain,  dyspnea on exertion, leg swelling, palpitations and syncope.  Psychiatric/Behavioral:         Delusions about neighbors spying on her       Vitals:   12/13/21 1030  BP: 138/85  Pulse: 67  Resp: 16  Temp: 98 F (36.7 C)  SpO2: 98%     Objective:   Physical Exam Vitals and nursing note reviewed.  Constitutional:      General: She is not in acute distress. Neck:     Vascular: No JVD.  Cardiovascular:     Rate and Rhythm: Normal rate and regular rhythm.     Heart sounds: Normal heart sounds. No murmur heard. Pulmonary:     Effort: Pulmonary effort is normal.     Breath sounds: Normal breath sounds. No wheezing or rales.  Musculoskeletal:     Right lower leg: No edema.     Left lower leg: No edema.         Assessment & Recommendations:   71 y/o [place] female with mitral valve prolapse, CAD, now s/p mitral valve repair, CABGX2 (03/2018 by Dr. 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   HFrEF: EF recovered, 50-55% (11/2021) Continue metoprolol succinate to 50 mg daily, Entresto to 49-51 mg bid, spironolactone 12.5 mg daily.  Elevated liver enzymes: Not seen by GI. Currently not taking statin. Recheck lipid panel and CMP,  S/P mitral valve repair: On Aspirin 81 mg daily.  Recommend endocarditis prophylaxis for dental procedures.  Essential hypertension: Controlled.  CAD: No angina symptoms. Continue aspirin 81 mg daily. Will check lipid panel, off statin due to liver enzymes elevation.  Delusions/hallucination: We did not discuss this today. Multiple prior conversations with multiple providers., including myself. Patient has no insight.   F/u in 6 months   Nigel Mormon, MD Pager: [phone] Office: [phone]

## 2021-12-15 ENCOUNTER — Telehealth: Payer: Self-pay | Admitting: Internal Medicine

## 2021-12-15 NOTE — Telephone Encounter (Signed)
Left message for patient to call back to schedule Medicare Annual Wellness Visit   Last AWV  12/24/20  Please schedule at anytime with [facility]-Nurse Health Advisor if patient calls the office back.     Any questions, please call me at [phone]  

## 2021-12-30 ENCOUNTER — Telehealth: Payer: Self-pay

## 2021-12-30 NOTE — Telephone Encounter (Signed)
LVM asking patient if interested in scheduling for AWV.

## 2022-02-21 ENCOUNTER — Encounter: Payer: Medicare Other | Admitting: Psychology

## 2022-02-21 ENCOUNTER — Encounter: Payer: Self-pay | Admitting: Psychology

## 2022-02-21 DIAGNOSIS — Z029 Encounter for administrative examinations, unspecified: Secondary | ICD-10-CM

## 2022-02-28 ENCOUNTER — Encounter: Payer: Medicare Other | Admitting: Psychology
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
## 2022-03-14 ENCOUNTER — Ambulatory Visit: Payer: Medicare Other | Admitting: Neurology

## 2022-04-04 ENCOUNTER — Telehealth: Payer: Self-pay

## 2022-04-04 NOTE — Telephone Encounter (Signed)
Pt Mammography paper forms given to provider to sign

## 2022-04-06 NOTE — Telephone Encounter (Signed)
Please fax orders back ASAP, pt has appointment this Friday 1.12.24 and they need the orders so they don't have to reschedule the appointment.

## 2022-04-06 NOTE — Telephone Encounter (Signed)
I have not seen any paperwork yet

## 2022-04-08 DIAGNOSIS — R922 Inconclusive mammogram: Secondary | ICD-10-CM | POA: Diagnosis not present

## 2022-04-08 DIAGNOSIS — Z853 Personal history of malignant neoplasm of breast: Secondary | ICD-10-CM | POA: Diagnosis not present

## 2022-04-08 LAB — HM MAMMOGRAPHY

## 2022-05-16 ENCOUNTER — Other Ambulatory Visit (HOSPITAL_COMMUNITY): Payer: Self-pay

## 2022-05-19 ENCOUNTER — Ambulatory Visit: Payer: Medicare Other | Admitting: Cardiology

## 2022-06-13 ENCOUNTER — Ambulatory Visit: Payer: Medicare Other | Admitting: Cardiology

## 2022-06-15 ENCOUNTER — Ambulatory Visit: Payer: Medicare Other | Admitting: Cardiology

## 2022-06-15 ENCOUNTER — Encounter: Payer: Self-pay | Admitting: Cardiology

## 2022-06-15 VITALS — BP 152/96 | HR 88 | Resp 16 | Ht 65.0 in | Wt 166.0 lb

## 2022-06-15 DIAGNOSIS — I5022 Chronic systolic (congestive) heart failure: Secondary | ICD-10-CM

## 2022-06-15 DIAGNOSIS — I10 Essential (primary) hypertension: Secondary | ICD-10-CM

## 2022-06-15 DIAGNOSIS — E782 Mixed hyperlipidemia: Secondary | ICD-10-CM | POA: Diagnosis not present

## 2022-06-15 MED ORDER — METOPROLOL SUCCINATE ER 50 MG PO TB24: 50.0000 mg | ORAL_TABLET | Freq: Every day | ORAL | 3 refills | Status: AC
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
## 2022-06-17 DIAGNOSIS — I5022 Chronic systolic (congestive) heart failure: Secondary | ICD-10-CM | POA: Diagnosis not present

## 2022-06-17 DIAGNOSIS — E782 Mixed hyperlipidemia: Secondary | ICD-10-CM | POA: Diagnosis not present

## 2022-06-18 LAB — COMPREHENSIVE METABOLIC PANEL
ALT: 9 IU/L (ref 0–32)
AST: 23 IU/L (ref 0–40)
Albumin/Globulin Ratio: 1 — ABNORMAL LOW (ref 1.2–2.2)
Albumin: 4 g/dL (ref 3.8–4.8)
Alkaline Phosphatase: 130 IU/L — ABNORMAL HIGH (ref 44–121)
BUN/Creatinine Ratio: 15 (ref 12–28)
BUN: 11 mg/dL (ref 8–27)
Bilirubin Total: 0.6 mg/dL (ref 0.0–1.2)
CO2: 25 mmol/L (ref 20–29)
Calcium: 9.2 mg/dL (ref 8.7–10.3)
Chloride: 104 mmol/L (ref 96–106)
Creatinine, Ser: 0.75 mg/dL (ref 0.57–1.00)
Globulin, Total: 4.2 g/dL (ref 1.5–4.5)
Glucose: 89 mg/dL (ref 70–99)
Potassium: 4.1 mmol/L (ref 3.5–5.2)
Sodium: 141 mmol/L (ref 134–144)
Total Protein: 8.2 g/dL (ref 6.0–8.5)
eGFR: 84 mL/min/{1.73_m2} (ref >59)

## 2022-06-18 LAB — LIPID PANEL
Chol/HDL Ratio: 2.6 ratio (ref 0.0–4.4)
Cholesterol, Total: 183 mg/dL (ref 100–199)
HDL: 70 mg/dL (ref >39)
LDL Chol Calc (NIH): 102 mg/dL — ABNORMAL HIGH (ref 0–99)
Triglycerides: 57 mg/dL (ref 0–149)
VLDL Cholesterol Cal: 11 mg/dL (ref 5–40)

## 2022-08-05 ENCOUNTER — Ambulatory Visit: Payer: Medicare Other | Admitting: Internal Medicine

## 2022-11-22 NOTE — Progress Notes (Signed)
No action done

## 2022-12-02 ENCOUNTER — Other Ambulatory Visit: Payer: Self-pay

## 2022-12-02 DIAGNOSIS — I10 Essential (primary) hypertension: Secondary | ICD-10-CM

## 2022-12-02 DIAGNOSIS — I5022 Chronic systolic (congestive) heart failure: Secondary | ICD-10-CM

## 2022-12-02 MED ORDER — ENTRESTO 49-51 MG PO TABS: 1.0000 | ORAL_TABLET | Freq: Two times a day (BID) | ORAL | 3 refills | Status: DC

## 2022-12-03 NOTE — Progress Notes (Signed)
This encounter was created in error - please disregard.

## 2022-12-05 ENCOUNTER — Other Ambulatory Visit: Payer: Self-pay

## 2022-12-05 DIAGNOSIS — I10 Essential (primary) hypertension: Secondary | ICD-10-CM

## 2022-12-05 DIAGNOSIS — I5022 Chronic systolic (congestive) heart failure: Secondary | ICD-10-CM

## 2022-12-05 MED ORDER — ENTRESTO 49-51 MG PO TABS: 1.0000 | ORAL_TABLET | Freq: Two times a day (BID) | ORAL | 3 refills | Status: AC

## 2023-06-14 ENCOUNTER — Ambulatory Visit: Payer: Medicare Other | Attending: Cardiology | Admitting: Cardiology

## 2023-06-14 NOTE — Progress Notes (Deleted)
  Cardiology Office Note:  .   Date:  06/14/2023  ID:  Tami Wood, DOB [birth year], MRN [id number] PCP: Corwin Levins, MD  [facility] Providers Cardiologist:  Truett Mainland, MD PCP: Corwin Levins, MD  No chief complaint on file.     History of Present Illness: .    Tami Wood is a 73 y.o. female with mitral valve prolapse, CAD, now s/p mitral valve repair, CABGX2 (03/2018 by Dr. 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    There were no vitals filed for this visit.   ROS: *** ROS   Studies Reviewed: .        *** Independently interpreted ***/202***: Chol ***, TG ***, HDL ***, LDL *** HbA1C ***% Hb *** Cr *** ***  Risk Assessment/Calculations:   {Does this patient have ATRIAL FIBRILLATION?:[phone]}     Physical Exam:   Physical Exam   VISIT DIAGNOSES: No diagnosis found.   ASSESSMENT AND PLAN: .    Tami Wood is a 73 y.o. female with mitral valve prolapse, CAD, now s/p mitral valve repair, CABGX2 (03/2018 by Dr. 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   ***  HFrEF: EF recovered, 50-55% (11/2021) Continue metoprolol succinate to 50 mg daily, Entresto to 49-51 mg bid, spironolactone 12.5 mg daily.   *** Elevated liver enzymes, RQU pain: I will check labs. She has never seen GI. I encouraged ehr to discuss w/Dr. Jonny Ruiz re: referral.   *** Urinary incontinence: She attributes to "stress of people spying on her". Encouraged to discuss w/Dr. Jonny Ruiz. She has not been amenable to treatment by a psychaitrist.  If that is not possible, consider referral to urology.   *** S/P mitral valve repair: On Aspirin 81 mg daily.  Recommend endocarditis prophylaxis for dental procedures.   *** Essential hypertension: BP elevated today, likely due to being off metoprolol, as well as stress/   *** CAD: No angina symptoms. Continue aspirin  81 mg daily. Will check lipid panel, off statin due to liver enzymes elevation.   *** Delusions/hallucination: We did not discuss this today. Multiple prior conversations with multiple providers., including myself. Patient has no insight.   {Are you ordering a CV Procedure (e.g. stress test, cath, DCCV, TEE, etc)?   Press F2        :[id number]}    No orders of the defined types were placed in this encounter.    F/u in ***  Signed, Tami Negus, MD

## 2024-02-26 ENCOUNTER — Encounter: Payer: Self-pay | Admitting: *Deleted

## 2024-02-26 NOTE — Progress Notes (Signed)
 Tami Wood                                          MRN: [id number]   02/26/2024   The [facility] Quality Team Specialist reviewed this patient medical record for the purposes of chart review for care gap closure. The following were reviewed: chart review for care gap closure-colorectal cancer screening.    [facility] Quality Team
